# Patient Record
Sex: Female | Born: 1970
Health system: Southern US, Community
[De-identification: ages and names within clinical notes are randomized; demographics above are authoritative.]

## PROBLEM LIST (undated history)

## (undated) DIAGNOSIS — C50419 Malignant neoplasm of upper-outer quadrant of unspecified female breast: Secondary | ICD-10-CM

## (undated) DIAGNOSIS — Z923 Personal history of irradiation: Secondary | ICD-10-CM

## (undated) DIAGNOSIS — M1990 Unspecified osteoarthritis, unspecified site: Secondary | ICD-10-CM

## (undated) DIAGNOSIS — F410 Panic disorder [episodic paroxysmal anxiety] without agoraphobia: Secondary | ICD-10-CM

## (undated) DIAGNOSIS — I10 Essential (primary) hypertension: Secondary | ICD-10-CM

## (undated) DIAGNOSIS — C50919 Malignant neoplasm of unspecified site of unspecified female breast: Secondary | ICD-10-CM

## (undated) DIAGNOSIS — E119 Type 2 diabetes mellitus without complications: Secondary | ICD-10-CM

## (undated) DIAGNOSIS — Z9221 Personal history of antineoplastic chemotherapy: Secondary | ICD-10-CM

## (undated) DIAGNOSIS — G4730 Sleep apnea, unspecified: Secondary | ICD-10-CM

## (undated) HISTORY — DX: Essential (primary) hypertension: I10

## (undated) HISTORY — DX: Unspecified osteoarthritis, unspecified site: M19.90

## (undated) HISTORY — PX: BREAST BIOPSY: SHX20

## (undated) HISTORY — DX: Type 2 diabetes mellitus without complications: E11.9

## (undated) HISTORY — DX: Malignant neoplasm of upper-outer quadrant of unspecified female breast: C50.419

## (undated) HISTORY — PX: REDUCTION MAMMAPLASTY: SUR839

---

## 2003-05-06 HISTORY — PX: BREAST REDUCTION SURGERY: SHX8

## 2003-07-23 ENCOUNTER — Other Ambulatory Visit: Payer: Self-pay

## 2004-03-18 ENCOUNTER — Ambulatory Visit: Payer: Self-pay

## 2006-01-02 ENCOUNTER — Other Ambulatory Visit: Payer: Self-pay

## 2006-01-02 ENCOUNTER — Inpatient Hospital Stay: Payer: Self-pay | Admitting: Internal Medicine

## 2007-02-09 ENCOUNTER — Emergency Department (HOSPITAL_COMMUNITY): Admission: EM | Admit: 2007-02-09 | Discharge: 2007-02-09 | Payer: Self-pay | Admitting: Emergency Medicine

## 2007-03-23 ENCOUNTER — Emergency Department (HOSPITAL_COMMUNITY): Admission: EM | Admit: 2007-03-23 | Discharge: 2007-03-23 | Payer: Self-pay | Admitting: Emergency Medicine

## 2008-09-25 ENCOUNTER — Emergency Department (HOSPITAL_COMMUNITY): Admission: EM | Admit: 2008-09-25 | Discharge: 2008-09-25 | Payer: Self-pay | Admitting: Emergency Medicine

## 2008-11-19 ENCOUNTER — Emergency Department: Payer: Self-pay | Admitting: Emergency Medicine

## 2008-12-19 ENCOUNTER — Encounter: Payer: Self-pay | Admitting: Orthopedic Surgery

## 2009-01-03 ENCOUNTER — Encounter: Payer: Self-pay | Admitting: Orthopedic Surgery

## 2009-04-12 ENCOUNTER — Emergency Department: Payer: Self-pay | Admitting: Internal Medicine

## 2009-07-05 ENCOUNTER — Emergency Department: Payer: Self-pay | Admitting: Emergency Medicine

## 2009-07-12 ENCOUNTER — Ambulatory Visit: Payer: Self-pay | Admitting: Family Medicine

## 2009-09-04 ENCOUNTER — Emergency Department: Payer: Self-pay | Admitting: Emergency Medicine

## 2010-05-13 ENCOUNTER — Encounter: Payer: Self-pay | Admitting: Physician Assistant

## 2010-05-28 ENCOUNTER — Ambulatory Visit: Payer: Self-pay | Admitting: Internal Medicine

## 2010-05-31 ENCOUNTER — Ambulatory Visit: Payer: Self-pay | Admitting: Orthopedic Surgery

## 2010-06-05 ENCOUNTER — Encounter: Payer: Self-pay | Admitting: Physician Assistant

## 2010-07-03 ENCOUNTER — Emergency Department (HOSPITAL_COMMUNITY): Payer: Medicaid Other

## 2010-07-03 ENCOUNTER — Emergency Department (HOSPITAL_COMMUNITY)
Admission: EM | Admit: 2010-07-03 | Discharge: 2010-07-03 | Disposition: A | Discharge status: Home / Self Care | Payer: Medicaid Other | Attending: Emergency Medicine | Admitting: Emergency Medicine

## 2010-07-03 DIAGNOSIS — F988 Other specified behavioral and emotional disorders with onset usually occurring in childhood and adolescence: Secondary | ICD-10-CM | POA: Insufficient documentation

## 2010-07-03 DIAGNOSIS — Z79899 Other long term (current) drug therapy: Secondary | ICD-10-CM | POA: Insufficient documentation

## 2010-07-03 DIAGNOSIS — F329 Major depressive disorder, single episode, unspecified: Secondary | ICD-10-CM | POA: Insufficient documentation

## 2010-07-03 DIAGNOSIS — F3289 Other specified depressive episodes: Secondary | ICD-10-CM | POA: Insufficient documentation

## 2010-07-03 DIAGNOSIS — R0602 Shortness of breath: Secondary | ICD-10-CM | POA: Insufficient documentation

## 2010-07-03 DIAGNOSIS — R079 Chest pain, unspecified: Secondary | ICD-10-CM | POA: Insufficient documentation

## 2010-07-03 DIAGNOSIS — I10 Essential (primary) hypertension: Secondary | ICD-10-CM | POA: Insufficient documentation

## 2010-07-03 DIAGNOSIS — F411 Generalized anxiety disorder: Secondary | ICD-10-CM | POA: Insufficient documentation

## 2010-07-03 DIAGNOSIS — M129 Arthropathy, unspecified: Secondary | ICD-10-CM | POA: Insufficient documentation

## 2010-07-03 LAB — CBC
HCT: 39.4 % (ref 36.0–46.0)
Hemoglobin: 13.7 g/dL (ref 12.0–15.0)
MCH: 30.1 pg (ref 26.0–34.0)
RBC: 4.55 MIL/uL (ref 3.87–5.11)
WBC: 7.7 10*3/uL (ref 4.0–10.5)

## 2010-07-03 LAB — BASIC METABOLIC PANEL
Calcium: 9.4 mg/dL (ref 8.4–10.5)
Creatinine, Ser: 0.77 mg/dL (ref 0.4–1.2)
GFR calc non Af Amer: >60 mL/min (ref >60)
Potassium: 3.8 mEq/L (ref 3.5–5.1)

## 2010-07-03 LAB — DIFFERENTIAL
Basophils Absolute: 0 10*3/uL (ref 0.0–0.1)
Basophils Relative: 1 % (ref 0–1)
Eosinophils Absolute: 0.2 10*3/uL (ref 0.0–0.7)
Eosinophils Relative: 3 % (ref 0–5)
Lymphs Abs: 2.8 10*3/uL (ref 0.7–4.0)
Monocytes Relative: 9 % (ref 3–12)

## 2010-08-13 LAB — CBC
MCV: 87.5 fL (ref 78.0–100.0)
Platelets: 370 10*3/uL (ref 150–400)
RBC: 4.34 MIL/uL (ref 3.87–5.11)
RDW: 14.1 % (ref 11.5–15.5)
WBC: 9.1 10*3/uL (ref 4.0–10.5)

## 2010-08-13 LAB — POCT I-STAT, CHEM 8
BUN: 12 mg/dL (ref 6–23)
Chloride: 103 mEq/L (ref 96–112)
Glucose, Bld: 106 mg/dL — ABNORMAL HIGH (ref 70–99)
Hemoglobin: 13.3 g/dL (ref 12.0–15.0)
Potassium: 3.6 mEq/L (ref 3.5–5.1)
Sodium: 136 mEq/L (ref 135–145)
TCO2: 25 mmol/L (ref 0–100)

## 2010-08-13 LAB — URINALYSIS, ROUTINE W REFLEX MICROSCOPIC
Bilirubin Urine: NEGATIVE
Glucose, UA: NEGATIVE mg/dL
Hgb urine dipstick: NEGATIVE
Specific Gravity, Urine: 1.013 (ref 1.005–1.030)
Urobilinogen, UA: 0.2 mg/dL (ref 0.0–1.0)
pH: 6.5 (ref 5.0–8.0)

## 2010-08-13 LAB — POCT CARDIAC MARKERS
CKMB, poc: <1 ng/mL — ABNORMAL LOW (ref 1.0–8.0)
Myoglobin, poc: 50.8 ng/mL (ref 12–200)

## 2010-08-13 LAB — DIFFERENTIAL
Basophils Absolute: 0.1 10*3/uL (ref 0.0–0.1)
Eosinophils Absolute: 0.3 10*3/uL (ref 0.0–0.7)
Monocytes Relative: 8 % (ref 3–12)

## 2010-08-13 LAB — POCT PREGNANCY, URINE: Preg Test, Ur: NEGATIVE

## 2010-09-09 ENCOUNTER — Emergency Department: Payer: Self-pay | Admitting: Internal Medicine

## 2010-11-14 ENCOUNTER — Ambulatory Visit: Payer: Self-pay | Admitting: Family Medicine

## 2011-01-29 ENCOUNTER — Emergency Department: Payer: Self-pay | Admitting: Emergency Medicine

## 2011-02-11 LAB — DIFFERENTIAL
Basophils Absolute: 0.1
Lymphocytes Relative: 33
Monocytes Absolute: 0.6
Neutro Abs: 5.4
Neutrophils Relative %: 56

## 2011-02-11 LAB — I-STAT 8, (EC8 V) (CONVERTED LAB)
BUN: 11
Bicarbonate: 30.1 — ABNORMAL HIGH
Chloride: 102
Glucose, Bld: 96
HCT: 42
Operator id: 196461

## 2011-02-11 LAB — CBC
HCT: 38.2
RBC: 4.34
RDW: 14.1

## 2011-02-11 LAB — POCT CARDIAC MARKERS
CKMB, poc: <1 — ABNORMAL LOW
Operator id: 196461
Troponin i, poc: <0.05

## 2011-02-13 LAB — POCT CARDIAC MARKERS
CKMB, poc: <1 — ABNORMAL LOW
Myoglobin, poc: 48.5
Myoglobin, poc: 64.8
Operator id: 285841
Operator id: 285841
Troponin i, poc: <0.05
Troponin i, poc: <0.05

## 2011-02-13 LAB — URINALYSIS, ROUTINE W REFLEX MICROSCOPIC
Bilirubin Urine: NEGATIVE
Glucose, UA: NEGATIVE
Hgb urine dipstick: NEGATIVE
Protein, ur: NEGATIVE

## 2011-02-13 LAB — I-STAT 8, (EC8 V) (CONVERTED LAB)
BUN: 11
Operator id: 198171
Potassium: 3.8
pCO2, Ven: 52.5 — ABNORMAL HIGH
pH, Ven: 7.372 — ABNORMAL HIGH

## 2011-02-13 LAB — CBC
MCHC: 33.7
MCV: 88.8
RBC: 4.36

## 2011-02-13 LAB — DIFFERENTIAL
Basophils Relative: 1
Monocytes Relative: 6
Neutro Abs: 6.1

## 2011-02-13 LAB — POCT I-STAT CREATININE: Operator id: 198171

## 2011-02-19 ENCOUNTER — Ambulatory Visit: Payer: Self-pay | Admitting: Family Medicine

## 2011-02-20 ENCOUNTER — Ambulatory Visit: Payer: Self-pay | Admitting: Family Medicine

## 2011-03-24 ENCOUNTER — Ambulatory Visit: Payer: Self-pay | Admitting: General Surgery

## 2011-03-25 LAB — CEA: CEA: 1.6 ng/mL (ref 0.0–4.7)

## 2011-03-25 LAB — CANCER ANTIGEN 27.29: CA 27.29: 11.9 U/mL (ref 0.0–38.6)

## 2011-04-12 ENCOUNTER — Emergency Department: Payer: Self-pay | Admitting: Emergency Medicine

## 2011-05-06 HISTORY — PX: OOPHORECTOMY: SHX86

## 2011-05-06 HISTORY — PX: BREAST LUMPECTOMY: SHX2

## 2011-05-06 HISTORY — PX: BREAST BIOPSY: SHX20

## 2011-05-12 ENCOUNTER — Ambulatory Visit: Payer: Self-pay | Admitting: General Surgery

## 2011-05-21 ENCOUNTER — Ambulatory Visit: Payer: Self-pay | Admitting: General Surgery

## 2011-05-21 DIAGNOSIS — C50412 Malignant neoplasm of upper-outer quadrant of left female breast: Secondary | ICD-10-CM | POA: Insufficient documentation

## 2011-05-21 DIAGNOSIS — C50419 Malignant neoplasm of upper-outer quadrant of unspecified female breast: Secondary | ICD-10-CM | POA: Insufficient documentation

## 2011-05-21 HISTORY — DX: Malignant neoplasm of upper-outer quadrant of unspecified female breast: C50.419

## 2011-05-23 LAB — PATHOLOGY REPORT

## 2011-05-26 ENCOUNTER — Ambulatory Visit: Payer: Self-pay | Admitting: Oncology

## 2011-05-28 ENCOUNTER — Ambulatory Visit: Payer: Self-pay | Admitting: Oncology

## 2011-06-06 ENCOUNTER — Ambulatory Visit: Payer: Self-pay | Admitting: Oncology

## 2011-06-06 HISTORY — PX: BREAST MAMMOSITE: SHX5264

## 2011-06-23 LAB — COMPREHENSIVE METABOLIC PANEL
Albumin: 3.5 g/dL (ref 3.4–5.0)
BUN: 12 mg/dL (ref 7–18)
Calcium, Total: 8.6 mg/dL (ref 8.5–10.1)
Chloride: 101 mmol/L (ref 98–107)
EGFR (African American): >60
EGFR (Non-African Amer.): >60
Glucose: 95 mg/dL (ref 65–99)
SGOT(AST): 17 U/L (ref 15–37)
SGPT (ALT): 19 U/L
Total Protein: 7.5 g/dL (ref 6.4–8.2)

## 2011-06-23 LAB — CBC CANCER CENTER
Basophil %: 0.4 %
Eosinophil %: 3.3 %
HCT: 37.1 % (ref 35.0–47.0)
HGB: 12.2 g/dL (ref 12.0–16.0)
Lymphocyte #: 1.2 x10 3/mm (ref 1.0–3.6)
MCH: 29 pg (ref 26.0–34.0)
MCV: 88 fL (ref 80–100)
Monocyte #: 0.4 x10 3/mm (ref 0.0–0.7)
Monocyte %: 7.7 %
Neutrophil #: 3.8 x10 3/mm (ref 1.4–6.5)
RBC: 4.22 10*6/uL (ref 3.80–5.20)

## 2011-06-25 ENCOUNTER — Ambulatory Visit: Payer: Self-pay | Admitting: General Surgery

## 2011-07-04 ENCOUNTER — Ambulatory Visit: Payer: Self-pay | Admitting: Oncology

## 2011-07-08 LAB — CBC CANCER CENTER
Basophil #: 0.1 x10 3/mm (ref 0.0–0.1)
Lymphocyte #: 1.5 x10 3/mm (ref 1.0–3.6)
MCH: 29.3 pg (ref 26.0–34.0)
MCV: 88 fL (ref 80–100)
Monocyte #: 1.2 x10 3/mm — ABNORMAL HIGH (ref 0.0–0.7)
Monocyte %: 13.2 %
Neutrophil %: 67.9 %
Platelet: 318 x10 3/mm (ref 150–440)
RDW: 14.2 % (ref 11.5–14.5)
WBC: 8.8 x10 3/mm (ref 3.6–11.0)

## 2011-07-15 LAB — CBC CANCER CENTER
Basophil #: 0 x10 3/mm (ref 0.0–0.1)
HCT: 35.2 % (ref 35.0–47.0)
Lymphocyte %: 12.3 %
Monocyte %: 6.3 %
Neutrophil #: 5.2 x10 3/mm (ref 1.4–6.5)
Platelet: 199 x10 3/mm (ref 150–440)
RBC: 4.03 10*6/uL (ref 3.80–5.20)
RDW: 14.4 % (ref 11.5–14.5)
WBC: 6.5 x10 3/mm (ref 3.6–11.0)

## 2011-07-22 LAB — CBC CANCER CENTER
Basophil #: 0 x10 3/mm (ref 0.0–0.1)
HCT: 35.7 % (ref 35.0–47.0)
HGB: 12 g/dL (ref 12.0–16.0)
Lymphocyte %: 7.5 %
MCH: 29.3 pg (ref 26.0–34.0)
MCV: 87 fL (ref 80–100)
Monocyte %: 5.6 %
Platelet: 346 x10 3/mm (ref 150–440)
RDW: 14.7 % — ABNORMAL HIGH (ref 11.5–14.5)

## 2011-07-22 LAB — COMPREHENSIVE METABOLIC PANEL
Anion Gap: 10 (ref 7–16)
Bilirubin,Total: 0.2 mg/dL (ref 0.2–1.0)
Chloride: 101 mmol/L (ref 98–107)
EGFR (African American): >60
EGFR (Non-African Amer.): >60
Osmolality: 282 (ref 275–301)
Potassium: 3.7 mmol/L (ref 3.5–5.1)
SGPT (ALT): 27 U/L
Sodium: 139 mmol/L (ref 136–145)
Total Protein: 7.6 g/dL (ref 6.4–8.2)

## 2011-07-29 LAB — CBC CANCER CENTER
Basophil: 2 %
Eosinophil %: 0.8 %
HGB: 11.6 g/dL — ABNORMAL LOW (ref 12.0–16.0)
Lymphocyte #: 1 x10 3/mm (ref 1.0–3.6)
Lymphocyte %: 20.5 %
Lymphocytes: 21 %
Metamyelocyte: 1 %
Monocyte #: 1.1 x10 3/mm — ABNORMAL HIGH (ref 0.0–0.7)
Monocyte %: 22.3 %
NRBC/100 WBC: 1 /100
Neutrophil %: 55.4 %
RDW: 15.1 % — ABNORMAL HIGH (ref 11.5–14.5)
Variant Lymphocyte: 2 %

## 2011-08-04 ENCOUNTER — Ambulatory Visit: Payer: Self-pay | Admitting: Oncology

## 2011-08-05 LAB — CBC CANCER CENTER
Eosinophil %: 0.8 %
Lymphocyte #: 1.2 x10 3/mm (ref 1.0–3.6)
MCHC: 33.1 g/dL (ref 32.0–36.0)
Neutrophil %: 80.6 %
RBC: 3.95 10*6/uL (ref 3.80–5.20)
RDW: 15.9 % — ABNORMAL HIGH (ref 11.5–14.5)

## 2011-08-12 LAB — CBC CANCER CENTER
Basophil %: 0 %
Eosinophil #: 0 x10 3/mm (ref 0.0–0.7)
HCT: 31.8 % — ABNORMAL LOW (ref 35.0–47.0)
Lymphocyte #: 0.8 x10 3/mm — ABNORMAL LOW (ref 1.0–3.6)
Lymphocyte %: 6.5 %
MCHC: 33.5 g/dL (ref 32.0–36.0)
MCV: 88 fL (ref 80–100)
Monocyte %: 4.5 %
Neutrophil %: 88.9 %
Platelet: 351 x10 3/mm (ref 150–440)
RBC: 3.61 10*6/uL — ABNORMAL LOW (ref 3.80–5.20)

## 2011-08-12 LAB — COMPREHENSIVE METABOLIC PANEL
Anion Gap: 11 (ref 7–16)
BUN: 12 mg/dL (ref 7–18)
Bilirubin,Total: 0.2 mg/dL (ref 0.2–1.0)
Creatinine: 0.69 mg/dL (ref 0.60–1.30)
EGFR (African American): >60
EGFR (Non-African Amer.): >60
Osmolality: 284 (ref 275–301)
SGOT(AST): 16 U/L (ref 15–37)
SGPT (ALT): 26 U/L
Sodium: 141 mmol/L (ref 136–145)
Total Protein: 7.2 g/dL (ref 6.4–8.2)

## 2011-08-19 LAB — CBC CANCER CENTER
Basophil %: 1.3 %
Lymphocyte %: 22.1 %
MCH: 28.5 pg (ref 26.0–34.0)
MCV: 89 fL (ref 80–100)
Monocyte %: 24.2 %
Neutrophil #: 3.3 x10 3/mm (ref 1.4–6.5)
Neutrophil %: 51.7 %
RBC: 3.82 10*6/uL (ref 3.80–5.20)

## 2011-08-26 LAB — CBC CANCER CENTER
Basophil #: 0 x10 3/mm (ref 0.0–0.1)
Eosinophil %: 0.7 %
HCT: 33.9 % — ABNORMAL LOW (ref 35.0–47.0)
Lymphocyte #: 0.8 x10 3/mm — ABNORMAL LOW (ref 1.0–3.6)
Lymphocyte %: 10.9 %
MCH: 28.4 pg (ref 26.0–34.0)
Monocyte %: 6.7 %
Neutrophil #: 6.3 x10 3/mm (ref 1.4–6.5)
Neutrophil %: 81.3 %
RBC: 3.78 10*6/uL — ABNORMAL LOW (ref 3.80–5.20)
RDW: 16.9 % — ABNORMAL HIGH (ref 11.5–14.5)

## 2011-09-02 LAB — CBC CANCER CENTER
Basophil %: 0.7 %
Eosinophil #: 0 x10 3/mm (ref 0.0–0.7)
HGB: 10.6 g/dL — ABNORMAL LOW (ref 12.0–16.0)
MCH: 28.8 pg (ref 26.0–34.0)
MCHC: 32.3 g/dL (ref 32.0–36.0)
Monocyte #: 0.7 x10 3/mm (ref 0.2–0.9)
Neutrophil #: 12.4 x10 3/mm — ABNORMAL HIGH (ref 1.4–6.5)
Neutrophil %: 88.1 %
RBC: 3.67 10*6/uL — ABNORMAL LOW (ref 3.80–5.20)
RDW: 17.3 % — ABNORMAL HIGH (ref 11.5–14.5)

## 2011-09-02 LAB — COMPREHENSIVE METABOLIC PANEL
Alkaline Phosphatase: 74 U/L (ref 50–136)
BUN: 14 mg/dL (ref 7–18)
Bilirubin,Total: 0.2 mg/dL (ref 0.2–1.0)
Calcium, Total: 9.1 mg/dL (ref 8.5–10.1)
Co2: 26 mmol/L (ref 21–32)
EGFR (African American): >60
EGFR (Non-African Amer.): >60
Glucose: 128 mg/dL — ABNORMAL HIGH (ref 65–99)
Osmolality: 282 (ref 275–301)
Potassium: 3.7 mmol/L (ref 3.5–5.1)
SGOT(AST): 15 U/L (ref 15–37)
Sodium: 140 mmol/L (ref 136–145)

## 2011-09-03 ENCOUNTER — Ambulatory Visit: Payer: Self-pay | Admitting: Oncology

## 2011-09-09 LAB — CBC CANCER CENTER
Basophil #: 0.1 x10 3/mm (ref 0.0–0.1)
Basophil %: 1.1 %
HCT: 32.8 % — ABNORMAL LOW (ref 35.0–47.0)
HGB: 10.5 g/dL — ABNORMAL LOW (ref 12.0–16.0)
MCH: 29 pg (ref 26.0–34.0)
MCHC: 32.1 g/dL (ref 32.0–36.0)
MCV: 90 fL (ref 80–100)
Neutrophil %: 55.2 %
Platelet: 294 x10 3/mm (ref 150–440)
RBC: 3.64 10*6/uL — ABNORMAL LOW (ref 3.80–5.20)
WBC: 7.2 x10 3/mm (ref 3.6–11.0)

## 2011-09-16 LAB — CBC CANCER CENTER
Eosinophil #: 0.1 x10 3/mm (ref 0.0–0.7)
Eosinophil %: 0.9 %
HCT: 32.7 % — ABNORMAL LOW (ref 35.0–47.0)
HGB: 10.6 g/dL — ABNORMAL LOW (ref 12.0–16.0)
MCH: 28.9 pg (ref 26.0–34.0)
MCV: 89 fL (ref 80–100)
Monocyte #: 0.4 x10 3/mm (ref 0.2–0.9)
Neutrophil #: 6.5 x10 3/mm (ref 1.4–6.5)
Neutrophil %: 83.5 %
Platelet: 196 x10 3/mm (ref 150–440)
RDW: 18.3 % — ABNORMAL HIGH (ref 11.5–14.5)

## 2011-09-23 LAB — COMPREHENSIVE METABOLIC PANEL
Albumin: 3.4 g/dL (ref 3.4–5.0)
Anion Gap: 11 (ref 7–16)
BUN: 15 mg/dL (ref 7–18)
Bilirubin,Total: 0.2 mg/dL (ref 0.2–1.0)
Chloride: 102 mmol/L (ref 98–107)
EGFR (African American): >60
Glucose: 132 mg/dL — ABNORMAL HIGH (ref 65–99)
Osmolality: 284 (ref 275–301)
Potassium: 3.7 mmol/L (ref 3.5–5.1)
SGOT(AST): 13 U/L — ABNORMAL LOW (ref 15–37)
SGPT (ALT): 26 U/L
Sodium: 141 mmol/L (ref 136–145)
Total Protein: 6.9 g/dL (ref 6.4–8.2)

## 2011-09-23 LAB — CBC CANCER CENTER
Basophil #: 0 x10 3/mm (ref 0.0–0.1)
Basophil %: 0.1 %
Eosinophil %: 0 %
HCT: 32.4 % — ABNORMAL LOW (ref 35.0–47.0)
HGB: 10.4 g/dL — ABNORMAL LOW (ref 12.0–16.0)
Lymphocyte #: 0.9 x10 3/mm — ABNORMAL LOW (ref 1.0–3.6)
Lymphocyte %: 7.6 %
MCH: 28.5 pg (ref 26.0–34.0)
MCHC: 32.1 g/dL (ref 32.0–36.0)
MCV: 89 fL (ref 80–100)
Monocyte %: 6 %
Neutrophil #: 9.8 x10 3/mm — ABNORMAL HIGH (ref 1.4–6.5)
Neutrophil %: 86.3 %
Platelet: 322 x10 3/mm (ref 150–440)
RBC: 3.65 10*6/uL — ABNORMAL LOW (ref 3.80–5.20)
RDW: 18.6 % — ABNORMAL HIGH (ref 11.5–14.5)
WBC: 11.4 x10 3/mm — ABNORMAL HIGH (ref 3.6–11.0)

## 2011-09-30 LAB — CBC CANCER CENTER
Basophil %: 0.8 %
HGB: 10.1 g/dL — ABNORMAL LOW (ref 12.0–16.0)
Lymphocyte #: 1 x10 3/mm (ref 1.0–3.6)
Lymphocyte %: 17.1 %
MCH: 29.4 pg (ref 26.0–34.0)
MCHC: 32.4 g/dL (ref 32.0–36.0)
Monocyte #: 1.5 x10 3/mm — ABNORMAL HIGH (ref 0.2–0.9)
Monocyte %: 25.4 %
Neutrophil #: 3.3 x10 3/mm (ref 1.4–6.5)
Platelet: 275 x10 3/mm (ref 150–440)
RBC: 3.45 10*6/uL — ABNORMAL LOW (ref 3.80–5.20)
RDW: 18.7 % — ABNORMAL HIGH (ref 11.5–14.5)
WBC: 6 x10 3/mm (ref 3.6–11.0)

## 2011-10-04 ENCOUNTER — Ambulatory Visit: Payer: Self-pay | Admitting: Oncology

## 2011-10-07 LAB — CBC CANCER CENTER
Basophil %: 0.4 %
Eosinophil #: 0.1 x10 3/mm (ref 0.0–0.7)
Eosinophil %: 1.4 %
HCT: 30.2 % — ABNORMAL LOW (ref 35.0–47.0)
HGB: 9.7 g/dL — ABNORMAL LOW (ref 12.0–16.0)
Lymphocyte %: 9.9 %
MCH: 29.1 pg (ref 26.0–34.0)
MCHC: 32 g/dL (ref 32.0–36.0)
MCV: 91 fL (ref 80–100)
Monocyte #: 0.6 x10 3/mm (ref 0.2–0.9)
Neutrophil #: 7.2 x10 3/mm — ABNORMAL HIGH (ref 1.4–6.5)
Neutrophil %: 81.9 %
RBC: 3.33 10*6/uL — ABNORMAL LOW (ref 3.80–5.20)
RDW: 18.8 % — ABNORMAL HIGH (ref 11.5–14.5)
WBC: 8.8 x10 3/mm (ref 3.6–11.0)

## 2011-10-14 LAB — CBC CANCER CENTER
HCT: 31.5 % — ABNORMAL LOW (ref 35.0–47.0)
Lymphocyte #: 0.7 x10 3/mm — ABNORMAL LOW (ref 1.0–3.6)
Lymphocyte %: 6.3 %
MCH: 29.1 pg (ref 26.0–34.0)
Monocyte %: 6.3 %
Neutrophil %: 87.3 %
Platelet: 356 x10 3/mm (ref 150–440)
RBC: 3.49 10*6/uL — ABNORMAL LOW (ref 3.80–5.20)
RDW: 18.8 % — ABNORMAL HIGH (ref 11.5–14.5)

## 2011-10-14 LAB — COMPREHENSIVE METABOLIC PANEL
Alkaline Phosphatase: 93 U/L (ref 50–136)
Anion Gap: 9 (ref 7–16)
BUN: 11 mg/dL (ref 7–18)
Bilirubin,Total: 0.2 mg/dL (ref 0.2–1.0)
Calcium, Total: 8.8 mg/dL (ref 8.5–10.1)
Chloride: 102 mmol/L (ref 98–107)
Co2: 29 mmol/L (ref 21–32)
Creatinine: 0.65 mg/dL (ref 0.60–1.30)
EGFR (African American): >60
Glucose: 145 mg/dL — ABNORMAL HIGH (ref 65–99)
Potassium: 3.7 mmol/L (ref 3.5–5.1)
SGPT (ALT): 24 U/L
Sodium: 140 mmol/L (ref 136–145)
Total Protein: 7.1 g/dL (ref 6.4–8.2)

## 2011-10-21 LAB — CBC CANCER CENTER
Basophil #: 0 x10 3/mm (ref 0.0–0.1)
Basophil %: 0.8 %
Eosinophil #: 0.1 x10 3/mm (ref 0.0–0.7)
Eosinophil %: 1.4 %
Lymphocyte #: 1.1 x10 3/mm (ref 1.0–3.6)
Lymphocyte %: 21 %
MCH: 29.3 pg (ref 26.0–34.0)
Monocyte #: 1.1 x10 3/mm — ABNORMAL HIGH (ref 0.2–0.9)
Monocyte %: 21.1 %
Neutrophil #: 2.9 x10 3/mm (ref 1.4–6.5)
Neutrophil %: 55.7 %
Platelet: 268 x10 3/mm (ref 150–440)
RDW: 18.4 % — ABNORMAL HIGH (ref 11.5–14.5)

## 2011-10-28 LAB — CBC CANCER CENTER
Basophil #: 0.1 x10 3/mm (ref 0.0–0.1)
Basophil %: 0.8 %
Eosinophil %: 1.5 %
HGB: 11 g/dL — ABNORMAL LOW (ref 12.0–16.0)
MCH: 29.2 pg (ref 26.0–34.0)
MCHC: 32.1 g/dL (ref 32.0–36.0)
Monocyte %: 5.7 %
Neutrophil #: 6.2 x10 3/mm (ref 1.4–6.5)
Neutrophil %: 78.8 %
Platelet: 262 x10 3/mm (ref 150–440)
RBC: 3.76 10*6/uL — ABNORMAL LOW (ref 3.80–5.20)
RDW: 18.6 % — ABNORMAL HIGH (ref 11.5–14.5)

## 2011-11-03 ENCOUNTER — Ambulatory Visit: Payer: Self-pay | Admitting: Oncology

## 2011-11-04 LAB — COMPREHENSIVE METABOLIC PANEL
Albumin: 3.5 g/dL (ref 3.4–5.0)
Alkaline Phosphatase: 90 U/L (ref 50–136)
Calcium, Total: 9.3 mg/dL (ref 8.5–10.1)
Co2: 32 mmol/L (ref 21–32)
Creatinine: 0.58 mg/dL — ABNORMAL LOW (ref 0.60–1.30)
EGFR (Non-African Amer.): >60
SGPT (ALT): 25 U/L

## 2011-11-04 LAB — CBC CANCER CENTER
Eosinophil #: 0.1 x10 3/mm (ref 0.0–0.7)
Lymphocyte %: 17.2 %
MCHC: 32.3 g/dL (ref 32.0–36.0)
Monocyte #: 0.4 x10 3/mm (ref 0.2–0.9)
Neutrophil %: 70.4 %
Platelet: 321 x10 3/mm (ref 150–440)
RBC: 3.59 10*6/uL — ABNORMAL LOW (ref 3.80–5.20)
RDW: 17.8 % — ABNORMAL HIGH (ref 11.5–14.5)
WBC: 4.8 x10 3/mm (ref 3.6–11.0)

## 2011-12-04 ENCOUNTER — Ambulatory Visit: Payer: Self-pay | Admitting: Oncology

## 2012-01-04 ENCOUNTER — Ambulatory Visit: Payer: Self-pay | Admitting: Oncology

## 2012-02-03 ENCOUNTER — Ambulatory Visit: Payer: Self-pay | Admitting: Oncology

## 2012-02-25 LAB — CBC CANCER CENTER
Basophil %: 0.6 %
Eosinophil %: 4.5 %
HGB: 12 g/dL (ref 12.0–16.0)
Lymphocyte %: 23.9 %
MCH: 27.8 pg (ref 26.0–34.0)
Monocyte #: 0.4 x10 3/mm (ref 0.2–0.9)
Neutrophil %: 64.7 %
Platelet: 251 x10 3/mm (ref 150–440)
RBC: 4.3 10*6/uL (ref 3.80–5.20)
WBC: 6.1 x10 3/mm (ref 3.6–11.0)

## 2012-02-25 LAB — COMPREHENSIVE METABOLIC PANEL
BUN: 13 mg/dL (ref 7–18)
Chloride: 103 mmol/L (ref 98–107)
Co2: 30 mmol/L (ref 21–32)
EGFR (African American): >60
EGFR (Non-African Amer.): >60
SGOT(AST): 14 U/L — ABNORMAL LOW (ref 15–37)
SGPT (ALT): 22 U/L (ref 12–78)

## 2012-03-05 ENCOUNTER — Ambulatory Visit: Payer: Self-pay | Admitting: Oncology

## 2012-04-04 ENCOUNTER — Ambulatory Visit: Payer: Self-pay | Admitting: Oncology

## 2012-05-12 ENCOUNTER — Ambulatory Visit: Payer: Self-pay | Admitting: Oncology

## 2012-06-05 ENCOUNTER — Ambulatory Visit: Payer: Self-pay | Admitting: Oncology

## 2012-08-02 ENCOUNTER — Ambulatory Visit: Payer: Self-pay | Admitting: Oncology

## 2012-08-03 ENCOUNTER — Ambulatory Visit: Payer: Self-pay | Admitting: Oncology

## 2012-08-23 ENCOUNTER — Ambulatory Visit: Payer: Self-pay | Admitting: Oncology

## 2012-08-25 LAB — CBC CANCER CENTER
Basophil %: 0.8 %
Eosinophil #: 0.8 x10 3/mm — ABNORMAL HIGH (ref 0.0–0.7)
Eosinophil %: 11.6 %
HCT: 37.7 % (ref 35.0–47.0)
HGB: 12.5 g/dL (ref 12.0–16.0)
MCHC: 33.2 g/dL (ref 32.0–36.0)
MCV: 89 fL (ref 80–100)
Neutrophil %: 52.5 %
Platelet: 238 x10 3/mm (ref 150–440)
RDW: 14.3 % (ref 11.5–14.5)

## 2012-08-25 LAB — COMPREHENSIVE METABOLIC PANEL
Albumin: 3.2 g/dL — ABNORMAL LOW (ref 3.4–5.0)
Alkaline Phosphatase: 67 U/L (ref 50–136)
Bilirubin,Total: 0.2 mg/dL (ref 0.2–1.0)
Calcium, Total: 8.9 mg/dL (ref 8.5–10.1)
Creatinine: 0.84 mg/dL (ref 0.60–1.30)
Glucose: 122 mg/dL — ABNORMAL HIGH (ref 65–99)
Potassium: 3.9 mmol/L (ref 3.5–5.1)
SGOT(AST): 16 U/L (ref 15–37)
Total Protein: 7 g/dL (ref 6.4–8.2)

## 2012-08-26 LAB — CANCER ANTIGEN 27.29: CA 27.29: 11.1 U/mL (ref 0.0–38.6)

## 2012-09-02 ENCOUNTER — Ambulatory Visit: Payer: Self-pay | Admitting: Oncology

## 2012-10-03 ENCOUNTER — Ambulatory Visit: Payer: Self-pay | Admitting: Oncology

## 2012-10-03 ENCOUNTER — Ambulatory Visit: Payer: Self-pay | Admitting: Family Medicine

## 2012-11-02 ENCOUNTER — Ambulatory Visit: Payer: Self-pay | Admitting: Oncology

## 2012-11-16 ENCOUNTER — Emergency Department: Payer: Self-pay | Admitting: Emergency Medicine

## 2012-11-16 LAB — URINALYSIS, COMPLETE
Blood: NEGATIVE
RBC,UR: NONE SEEN /HPF (ref 0–5)
Specific Gravity: 1.005 (ref 1.003–1.030)
WBC UR: 1 /HPF (ref 0–5)

## 2012-11-17 ENCOUNTER — Ambulatory Visit: Payer: Self-pay | Admitting: Oncology

## 2012-12-03 ENCOUNTER — Ambulatory Visit: Payer: Self-pay | Admitting: Oncology

## 2012-12-18 ENCOUNTER — Emergency Department: Payer: Self-pay | Admitting: Internal Medicine

## 2012-12-18 LAB — CBC
HGB: 13 g/dL (ref 12.0–16.0)
MCV: 89 fL (ref 80–100)
Platelet: 261 10*3/uL (ref 150–440)
RBC: 4.21 10*6/uL (ref 3.80–5.20)
WBC: 9.2 10*3/uL (ref 3.6–11.0)

## 2012-12-18 LAB — URINALYSIS, COMPLETE
Bilirubin,UR: NEGATIVE
Blood: NEGATIVE
Ketone: NEGATIVE
Leukocyte Esterase: NEGATIVE
Nitrite: NEGATIVE
Ph: 5 (ref 4.5–8.0)
RBC,UR: <1 /HPF (ref 0–5)
Specific Gravity: 1.021 (ref 1.003–1.030)
Squamous Epithelial: <1
WBC UR: 1 /HPF (ref 0–5)

## 2012-12-18 LAB — COMPREHENSIVE METABOLIC PANEL
BUN: 12 mg/dL (ref 7–18)
Creatinine: 0.77 mg/dL (ref 0.60–1.30)
EGFR (Non-African Amer.): >60
SGOT(AST): 34 U/L (ref 15–37)
SGPT (ALT): 27 U/L (ref 12–78)
Sodium: 140 mmol/L (ref 136–145)
Total Protein: 7.1 g/dL (ref 6.4–8.2)

## 2012-12-18 LAB — TROPONIN I: Troponin-I: <0.02 ng/mL

## 2012-12-18 LAB — CK TOTAL AND CKMB (NOT AT ARMC): CK, Total: 61 U/L (ref 21–215)

## 2012-12-29 ENCOUNTER — Ambulatory Visit: Payer: Self-pay | Admitting: Oncology

## 2013-01-03 ENCOUNTER — Ambulatory Visit: Payer: Self-pay | Admitting: Oncology

## 2013-02-02 ENCOUNTER — Emergency Department: Payer: Self-pay | Admitting: Emergency Medicine

## 2013-02-02 LAB — CBC
HCT: 39.2 % (ref 35.0–47.0)
HGB: 13.2 g/dL (ref 12.0–16.0)
MCH: 30.4 pg (ref 26.0–34.0)
MCV: 90 fL (ref 80–100)
Platelet: 262 10*3/uL (ref 150–440)
RBC: 4.35 10*6/uL (ref 3.80–5.20)
RDW: 14.7 % — ABNORMAL HIGH (ref 11.5–14.5)
WBC: 8.1 10*3/uL (ref 3.6–11.0)

## 2013-02-02 LAB — URINALYSIS, COMPLETE
Bacteria: NONE SEEN
Blood: NEGATIVE
Glucose,UR: NEGATIVE mg/dL (ref 0–75)
Ketone: NEGATIVE
Nitrite: NEGATIVE
Ph: 5 (ref 4.5–8.0)
Protein: NEGATIVE
Specific Gravity: 1.019 (ref 1.003–1.030)
WBC UR: 1 /HPF (ref 0–5)

## 2013-02-02 LAB — BASIC METABOLIC PANEL
Anion Gap: 7 (ref 7–16)
BUN: 11 mg/dL (ref 7–18)
Calcium, Total: 9.1 mg/dL (ref 8.5–10.1)
Co2: 30 mmol/L (ref 21–32)
EGFR (African American): >60
EGFR (Non-African Amer.): >60
Glucose: 148 mg/dL — ABNORMAL HIGH (ref 65–99)
Osmolality: 280 (ref 275–301)
Potassium: 3.1 mmol/L — ABNORMAL LOW (ref 3.5–5.1)

## 2013-02-02 LAB — TROPONIN I: Troponin-I: <0.02 ng/mL

## 2013-02-09 ENCOUNTER — Ambulatory Visit: Payer: Self-pay | Admitting: Oncology

## 2013-02-09 LAB — CBC CANCER CENTER
Basophil #: 0 x10 3/mm (ref 0.0–0.1)
Basophil %: 0.3 %
Eosinophil #: 0.6 x10 3/mm (ref 0.0–0.7)
Eosinophil %: 6.9 %
HCT: 37.5 % (ref 35.0–47.0)
HGB: 12.5 g/dL (ref 12.0–16.0)
Lymphocyte #: 2.6 x10 3/mm (ref 1.0–3.6)
Lymphocyte %: 31.1 %
MCH: 30.5 pg (ref 26.0–34.0)
MCV: 91 fL (ref 80–100)
Monocyte #: 0.5 x10 3/mm (ref 0.2–0.9)
Monocyte %: 5.5 %
Neutrophil #: 4.6 x10 3/mm (ref 1.4–6.5)
Neutrophil %: 56.2 %
RBC: 4.11 10*6/uL (ref 3.80–5.20)
WBC: 8.2 x10 3/mm (ref 3.6–11.0)

## 2013-02-09 LAB — COMPREHENSIVE METABOLIC PANEL
Albumin: 3.1 g/dL — ABNORMAL LOW (ref 3.4–5.0)
Alkaline Phosphatase: 74 U/L (ref 50–136)
Anion Gap: 9 (ref 7–16)
Bilirubin,Total: 0.2 mg/dL (ref 0.2–1.0)
Calcium, Total: 8.3 mg/dL — ABNORMAL LOW (ref 8.5–10.1)
Chloride: 103 mmol/L (ref 98–107)
Co2: 29 mmol/L (ref 21–32)
Creatinine: 0.86 mg/dL (ref 0.60–1.30)
EGFR (Non-African Amer.): >60
Glucose: 139 mg/dL — ABNORMAL HIGH (ref 65–99)
Osmolality: 283 (ref 275–301)
Potassium: 3.5 mmol/L (ref 3.5–5.1)
SGOT(AST): 22 U/L (ref 15–37)
Sodium: 141 mmol/L (ref 136–145)

## 2013-02-10 LAB — CANCER ANTIGEN 27.29: CA 27.29: 8.9 U/mL (ref 0.0–38.6)

## 2013-02-22 ENCOUNTER — Emergency Department: Payer: Self-pay | Admitting: Emergency Medicine

## 2013-02-22 LAB — CBC
HGB: 13.3 g/dL (ref 12.0–16.0)
MCH: 30.5 pg (ref 26.0–34.0)
MCHC: 34 g/dL (ref 32.0–36.0)
Platelet: 288 10*3/uL (ref 150–440)
RDW: 14.1 % (ref 11.5–14.5)

## 2013-02-22 LAB — TROPONIN I: Troponin-I: <0.02 ng/mL

## 2013-02-22 LAB — URINALYSIS, COMPLETE
Bacteria: NONE SEEN
Ketone: NEGATIVE
Leukocyte Esterase: NEGATIVE
Nitrite: NEGATIVE
Ph: 7 (ref 4.5–8.0)
Protein: NEGATIVE
RBC,UR: 1 /HPF (ref 0–5)
Specific Gravity: 1.012 (ref 1.003–1.030)
Squamous Epithelial: 3

## 2013-02-22 LAB — COMPREHENSIVE METABOLIC PANEL
Alkaline Phosphatase: 65 U/L (ref 50–136)
BUN: 8 mg/dL (ref 7–18)
Calcium, Total: 9.3 mg/dL (ref 8.5–10.1)
Co2: 29 mmol/L (ref 21–32)
EGFR (Non-African Amer.): >60
Glucose: 125 mg/dL — ABNORMAL HIGH (ref 65–99)
Potassium: 3.2 mmol/L — ABNORMAL LOW (ref 3.5–5.1)
SGOT(AST): 43 U/L — ABNORMAL HIGH (ref 15–37)
SGPT (ALT): 36 U/L (ref 12–78)
Sodium: 137 mmol/L (ref 136–145)
Total Protein: 7.5 g/dL (ref 6.4–8.2)

## 2013-03-05 ENCOUNTER — Ambulatory Visit: Payer: Self-pay | Admitting: Oncology

## 2013-04-15 ENCOUNTER — Ambulatory Visit: Payer: Self-pay | Admitting: Oncology

## 2013-05-05 ENCOUNTER — Ambulatory Visit: Payer: Self-pay | Admitting: Oncology

## 2013-06-29 ENCOUNTER — Ambulatory Visit: Payer: Self-pay | Admitting: Oncology

## 2013-07-04 ENCOUNTER — Ambulatory Visit: Payer: Self-pay | Admitting: Bariatrics

## 2013-07-04 DIAGNOSIS — E119 Type 2 diabetes mellitus without complications: Secondary | ICD-10-CM

## 2013-07-04 DIAGNOSIS — E669 Obesity, unspecified: Secondary | ICD-10-CM

## 2013-07-04 LAB — PROTIME-INR
INR: 0.9
PROTHROMBIN TIME: 12.1 s (ref 11.5–14.7)

## 2013-07-04 LAB — CBC WITH DIFFERENTIAL/PLATELET
Basophil #: 0.1 10*3/uL (ref 0.0–0.1)
Basophil %: 1.7 %
EOS ABS: 0.5 10*3/uL (ref 0.0–0.7)
Eosinophil %: 7.1 %
HCT: 37.2 % (ref 35.0–47.0)
HGB: 12.6 g/dL (ref 12.0–16.0)
LYMPHS PCT: 30.3 %
Lymphocyte #: 2.3 10*3/uL (ref 1.0–3.6)
MCH: 30.7 pg (ref 26.0–34.0)
MCHC: 33.7 g/dL (ref 32.0–36.0)
MCV: 91 fL (ref 80–100)
MONOS PCT: 6.9 %
Monocyte #: 0.5 x10 3/mm (ref 0.2–0.9)
NEUTROS ABS: 4.2 10*3/uL (ref 1.4–6.5)
NEUTROS PCT: 54 %
PLATELETS: 251 10*3/uL (ref 150–440)
RBC: 4.09 10*6/uL (ref 3.80–5.20)
RDW: 14 % (ref 11.5–14.5)
WBC: 7.7 10*3/uL (ref 3.6–11.0)

## 2013-07-04 LAB — FERRITIN: Ferritin (ARMC): 76 ng/mL (ref 8–388)

## 2013-07-04 LAB — AMYLASE: Amylase: 44 U/L (ref 25–115)

## 2013-07-04 LAB — HEMOGLOBIN A1C: HEMOGLOBIN A1C: 7.4 % — AB (ref 4.2–6.3)

## 2013-07-04 LAB — PHOSPHORUS: PHOSPHORUS: 3.9 mg/dL (ref 2.5–4.9)

## 2013-07-04 LAB — IRON AND TIBC
IRON SATURATION: 16 %
Iron Bind.Cap.(Total): 305 ug/dL (ref 250–450)
Iron: 48 ug/dL — ABNORMAL LOW (ref 50–170)
UNBOUND IRON-BIND. CAP.: 257 ug/dL

## 2013-07-04 LAB — COMPREHENSIVE METABOLIC PANEL
ALT: 32 U/L (ref 12–78)
ANION GAP: 3 — AB (ref 7–16)
AST: 24 U/L (ref 15–37)
Albumin: 3.1 g/dL — ABNORMAL LOW (ref 3.4–5.0)
Alkaline Phosphatase: 60 U/L
BILIRUBIN TOTAL: 0.1 mg/dL — AB (ref 0.2–1.0)
BUN: 13 mg/dL (ref 7–18)
Calcium, Total: 8.8 mg/dL (ref 8.5–10.1)
Chloride: 99 mmol/L (ref 98–107)
Co2: 35 mmol/L — ABNORMAL HIGH (ref 21–32)
Creatinine: 0.68 mg/dL (ref 0.60–1.30)
EGFR (African American): >60
GLUCOSE: 174 mg/dL — AB (ref 65–99)
Osmolality: 278 (ref 275–301)
POTASSIUM: 3.1 mmol/L — AB (ref 3.5–5.1)
Sodium: 137 mmol/L (ref 136–145)
Total Protein: 6.8 g/dL (ref 6.4–8.2)

## 2013-07-04 LAB — BILIRUBIN, DIRECT

## 2013-07-04 LAB — LIPASE, BLOOD: Lipase: 86 U/L (ref 73–393)

## 2013-07-04 LAB — APTT: Activated PTT: 26.7 secs (ref 23.6–35.9)

## 2013-07-04 LAB — FOLATE: Folic Acid: 6.4 ng/mL (ref 3.1–100.0)

## 2013-07-04 LAB — MAGNESIUM: Magnesium: 1.8 mg/dL

## 2013-07-04 LAB — TSH: THYROID STIMULATING HORM: 1.94 u[IU]/mL

## 2013-07-07 ENCOUNTER — Ambulatory Visit: Payer: Self-pay | Admitting: Bariatrics

## 2013-07-07 LAB — HCG, QUANTITATIVE, PREGNANCY

## 2013-07-08 ENCOUNTER — Ambulatory Visit: Payer: Self-pay | Admitting: Oncology

## 2013-07-15 ENCOUNTER — Ambulatory Visit: Payer: Self-pay | Admitting: Bariatrics

## 2013-07-17 ENCOUNTER — Emergency Department: Payer: Self-pay | Admitting: Emergency Medicine

## 2013-07-17 LAB — COMPREHENSIVE METABOLIC PANEL
Albumin: 3.2 g/dL — ABNORMAL LOW (ref 3.4–5.0)
Alkaline Phosphatase: 68 U/L
Anion Gap: 5 — ABNORMAL LOW (ref 7–16)
BUN: 12 mg/dL (ref 7–18)
Bilirubin,Total: 0.2 mg/dL (ref 0.2–1.0)
Calcium, Total: 8.6 mg/dL (ref 8.5–10.1)
Chloride: 99 mmol/L (ref 98–107)
Co2: 32 mmol/L (ref 21–32)
Creatinine: 0.84 mg/dL (ref 0.60–1.30)
EGFR (African American): >60
EGFR (Non-African Amer.): >60
Glucose: 268 mg/dL — ABNORMAL HIGH (ref 65–99)
Osmolality: 281 (ref 275–301)
Potassium: 3.5 mmol/L (ref 3.5–5.1)
SGOT(AST): 33 U/L (ref 15–37)
SGPT (ALT): 30 U/L (ref 12–78)
Sodium: 136 mmol/L (ref 136–145)
Total Protein: 7.1 g/dL (ref 6.4–8.2)

## 2013-07-17 LAB — CBC WITH DIFFERENTIAL/PLATELET
Basophil #: 0.1 10*3/uL (ref 0.0–0.1)
Basophil %: 0.9 %
EOS ABS: 0.5 10*3/uL (ref 0.0–0.7)
EOS PCT: 4.7 %
HCT: 39.7 % (ref 35.0–47.0)
HGB: 12.7 g/dL (ref 12.0–16.0)
Lymphocyte #: 2.4 10*3/uL (ref 1.0–3.6)
Lymphocyte %: 21.4 %
MCH: 29.2 pg (ref 26.0–34.0)
MCHC: 32.1 g/dL (ref 32.0–36.0)
MCV: 91 fL (ref 80–100)
Monocyte #: 0.7 x10 3/mm (ref 0.2–0.9)
Monocyte %: 6.7 %
NEUTROS ABS: 7.4 10*3/uL — AB (ref 1.4–6.5)
Neutrophil %: 66.3 %
PLATELETS: 278 10*3/uL (ref 150–440)
RBC: 4.36 10*6/uL (ref 3.80–5.20)
RDW: 14.3 % (ref 11.5–14.5)
WBC: 11.1 10*3/uL — ABNORMAL HIGH (ref 3.6–11.0)

## 2013-07-17 LAB — HCG, QUANTITATIVE, PREGNANCY: Beta Hcg, Quant.: <1 m[IU]/mL — ABNORMAL LOW

## 2013-07-17 LAB — CK: CK, Total: 58 U/L

## 2013-08-10 ENCOUNTER — Ambulatory Visit: Payer: Self-pay | Admitting: Oncology

## 2013-08-10 LAB — COMPREHENSIVE METABOLIC PANEL
ALBUMIN: 3 g/dL — AB (ref 3.4–5.0)
ALK PHOS: 58 U/L
AST: 24 U/L (ref 15–37)
Anion Gap: 7 (ref 7–16)
BUN: 11 mg/dL (ref 7–18)
Bilirubin,Total: 0.2 mg/dL (ref 0.2–1.0)
CALCIUM: 8.8 mg/dL (ref 8.5–10.1)
CREATININE: 0.7 mg/dL (ref 0.60–1.30)
Chloride: 101 mmol/L (ref 98–107)
Co2: 33 mmol/L — ABNORMAL HIGH (ref 21–32)
EGFR (Non-African Amer.): >60
Glucose: 144 mg/dL — ABNORMAL HIGH (ref 65–99)
Osmolality: 283 (ref 275–301)
POTASSIUM: 3.1 mmol/L — AB (ref 3.5–5.1)
SGPT (ALT): 28 U/L (ref 12–78)
Sodium: 141 mmol/L (ref 136–145)
TOTAL PROTEIN: 6.6 g/dL (ref 6.4–8.2)

## 2013-08-10 LAB — CBC CANCER CENTER
BASOS ABS: 0.1 x10 3/mm (ref 0.0–0.1)
Basophil %: 0.8 %
Eosinophil #: 0.5 x10 3/mm (ref 0.0–0.7)
Eosinophil %: 7.3 %
HCT: 37.3 % (ref 35.0–47.0)
HGB: 12.2 g/dL (ref 12.0–16.0)
LYMPHS ABS: 1.9 x10 3/mm (ref 1.0–3.6)
LYMPHS PCT: 28.3 %
MCH: 29.2 pg (ref 26.0–34.0)
MCHC: 32.7 g/dL (ref 32.0–36.0)
MCV: 90 fL (ref 80–100)
Monocyte #: 0.5 x10 3/mm (ref 0.2–0.9)
Monocyte %: 7 %
NEUTROS PCT: 56.6 %
Neutrophil #: 3.9 x10 3/mm (ref 1.4–6.5)
Platelet: 236 x10 3/mm (ref 150–440)
RBC: 4.17 10*6/uL (ref 3.80–5.20)
RDW: 14 % (ref 11.5–14.5)
WBC: 6.9 x10 3/mm (ref 3.6–11.0)

## 2013-08-11 LAB — CANCER ANTIGEN 27.29: CA 27.29: 13.9 U/mL (ref 0.0–38.6)

## 2013-08-12 ENCOUNTER — Ambulatory Visit: Payer: Self-pay | Admitting: Bariatrics

## 2013-08-25 ENCOUNTER — Emergency Department: Payer: Self-pay | Admitting: Emergency Medicine

## 2013-09-02 ENCOUNTER — Ambulatory Visit: Payer: Self-pay | Admitting: Oncology

## 2013-09-02 ENCOUNTER — Ambulatory Visit: Payer: Self-pay | Admitting: Bariatrics

## 2013-09-20 ENCOUNTER — Ambulatory Visit: Payer: Self-pay | Admitting: Gastroenterology

## 2013-09-21 ENCOUNTER — Ambulatory Visit: Payer: Self-pay | Admitting: Oncology

## 2013-09-22 LAB — PATHOLOGY REPORT

## 2013-10-03 ENCOUNTER — Ambulatory Visit: Payer: Self-pay | Admitting: Oncology

## 2013-10-11 DIAGNOSIS — M1710 Unilateral primary osteoarthritis, unspecified knee: Secondary | ICD-10-CM | POA: Insufficient documentation

## 2013-10-11 DIAGNOSIS — M179 Osteoarthritis of knee, unspecified: Secondary | ICD-10-CM | POA: Insufficient documentation

## 2013-10-26 DIAGNOSIS — F319 Bipolar disorder, unspecified: Secondary | ICD-10-CM | POA: Insufficient documentation

## 2013-10-26 DIAGNOSIS — G4730 Sleep apnea, unspecified: Secondary | ICD-10-CM | POA: Insufficient documentation

## 2013-10-26 DIAGNOSIS — E668 Other obesity: Secondary | ICD-10-CM | POA: Insufficient documentation

## 2013-10-26 DIAGNOSIS — F419 Anxiety disorder, unspecified: Secondary | ICD-10-CM | POA: Insufficient documentation

## 2013-10-26 DIAGNOSIS — I10 Essential (primary) hypertension: Secondary | ICD-10-CM | POA: Insufficient documentation

## 2013-10-26 DIAGNOSIS — E782 Mixed hyperlipidemia: Secondary | ICD-10-CM | POA: Insufficient documentation

## 2013-10-26 DIAGNOSIS — E119 Type 2 diabetes mellitus without complications: Secondary | ICD-10-CM | POA: Insufficient documentation

## 2013-10-26 DIAGNOSIS — C50919 Malignant neoplasm of unspecified site of unspecified female breast: Secondary | ICD-10-CM | POA: Insufficient documentation

## 2013-10-31 DIAGNOSIS — R0602 Shortness of breath: Secondary | ICD-10-CM | POA: Insufficient documentation

## 2013-11-02 ENCOUNTER — Ambulatory Visit: Payer: Self-pay | Admitting: Oncology

## 2013-12-03 ENCOUNTER — Ambulatory Visit: Payer: Self-pay | Admitting: Oncology

## 2013-12-08 ENCOUNTER — Emergency Department: Payer: Self-pay | Admitting: Emergency Medicine

## 2013-12-20 ENCOUNTER — Encounter: Payer: Self-pay | Admitting: *Deleted

## 2013-12-20 LAB — COMPREHENSIVE METABOLIC PANEL
ALBUMIN: 3.4 g/dL (ref 3.4–5.0)
ALT: 50 U/L
Alkaline Phosphatase: 58 U/L
Anion Gap: 13 (ref 7–16)
BILIRUBIN TOTAL: 0.3 mg/dL (ref 0.2–1.0)
BUN: 13 mg/dL (ref 7–18)
Calcium, Total: 8.8 mg/dL (ref 8.5–10.1)
Chloride: 100 mmol/L (ref 98–107)
Co2: 29 mmol/L (ref 21–32)
Creatinine: 0.86 mg/dL (ref 0.60–1.30)
EGFR (Non-African Amer.): >60
GLUCOSE: 169 mg/dL — AB (ref 65–99)
Osmolality: 287 (ref 275–301)
Potassium: 3 mmol/L — ABNORMAL LOW (ref 3.5–5.1)
SGOT(AST): 37 U/L (ref 15–37)
Sodium: 142 mmol/L (ref 136–145)
Total Protein: 7.5 g/dL (ref 6.4–8.2)

## 2013-12-20 LAB — CBC CANCER CENTER
BASOS ABS: 0 x10 3/mm (ref 0.0–0.1)
BASOS PCT: 0.3 %
Eosinophil #: 0.7 x10 3/mm (ref 0.0–0.7)
Eosinophil %: 8.7 %
HCT: 40.5 % (ref 35.0–47.0)
HGB: 13.4 g/dL (ref 12.0–16.0)
LYMPHS ABS: 2.8 x10 3/mm (ref 1.0–3.6)
Lymphocyte %: 34.3 %
MCH: 29.9 pg (ref 26.0–34.0)
MCHC: 33 g/dL (ref 32.0–36.0)
MCV: 91 fL (ref 80–100)
Monocyte #: 0.3 x10 3/mm (ref 0.2–0.9)
Monocyte %: 4.1 %
Neutrophil #: 4.2 x10 3/mm (ref 1.4–6.5)
Neutrophil %: 52.6 %
PLATELETS: 280 x10 3/mm (ref 150–440)
RBC: 4.48 10*6/uL (ref 3.80–5.20)
RDW: 14.6 % — AB (ref 11.5–14.5)
WBC: 8.1 x10 3/mm (ref 3.6–11.0)

## 2014-01-03 ENCOUNTER — Ambulatory Visit: Payer: Self-pay | Admitting: Oncology

## 2014-01-05 ENCOUNTER — Ambulatory Visit: Payer: Self-pay | Admitting: General Surgery

## 2014-01-25 ENCOUNTER — Encounter: Payer: Self-pay | Admitting: General Surgery

## 2014-01-25 ENCOUNTER — Ambulatory Visit (INDEPENDENT_AMBULATORY_CARE_PROVIDER_SITE_OTHER): Payer: Medicare Other | Admitting: General Surgery

## 2014-01-25 VITALS — BP 126/84 | HR 80 | Resp 14 | Ht 67.0 in | Wt 334.0 lb

## 2014-01-25 DIAGNOSIS — C50912 Malignant neoplasm of unspecified site of left female breast: Secondary | ICD-10-CM

## 2014-01-25 DIAGNOSIS — C50919 Malignant neoplasm of unspecified site of unspecified female breast: Secondary | ICD-10-CM

## 2014-01-25 NOTE — Progress Notes (Signed)
Patient ID: Sherry Price, female   DOB: October 28, 1970, 43 y.o.   MRN: [id number]  Chief Complaint  Patient presents with  . Procedure    port removal     HPI Sherry Price is a 43 y.o. female.  here today for port removal. HPI  Past Medical History  Diagnosis Date  . Diabetes mellitus without complication   . Arthritis   . Hypertension   . 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    Past Surgical History  Procedure Laterality Date  . Breast lumpectomy  2013  . Breast reduction surgery  2005  . Oophorectomy Right 2013  . Breast mammosite Left February 2013    Family History  Problem Relation Age of Onset  . Hypertension Mother   . Heart disease Mother   . Diabetes Mother     Social History History  Substance Use Topics  . Smoking status: Former Smoker -- 26 years  . Smokeless tobacco: Not on file  . Alcohol Use: Yes     Comment: occasionally    No Known Allergies  Current Outpatient Prescriptions  Medication Sig Dispense Refill  . ALPRAZolam (XANAX) 1 MG tablet Take 1 mg by mouth 2 (two) times daily.       . Furosemide (LASIX PO) Take 1 tablet by mouth daily.      . hydrochlorothiazide (HYDRODIURIL) 25 MG tablet Take 25 mg by mouth daily.       . metFORMIN (GLUCOPHAGE) 500 MG tablet Take 500 mg by mouth daily with breakfast.       . POTASSIUM PO Take 1 tablet by mouth 2 (two) times daily.      . tamoxifen (NOLVADEX) 20 MG tablet Take 20 mg by mouth daily.       Marland Kitchen venlafaxine XR (EFFEXOR-XR) 150 MG 24 hr capsule Take 150 mg by mouth daily with breakfast.        No current facility-administered medications for this visit.    Review of Systems Review of Systems  Constitutional: Negative.   Respiratory: Negative.   Cardiovascular: Negative.     Blood pressure 126/84, pulse 80, resp. rate 14, height 5' 7" (1.702 m), weight  334 lb (151.501 kg).  Physical Exam Physical Exam  Pulmonary/Chest:    Lymphadenopathy:    She has no axillary adenopathy.       Left: No supraclavicular adenopathy present.    Data Reviewed Order for port removal from medical oncology reviewed.  Assessment    Completion of adjuvant therapy post wide excision and partial breast radiation.    Plan    The procedure was reviewed and the patient was amenable to proceed. 10 cc of 0.5% Xylocaine with 0.25% Marcaine with 1 200,000 units of epinephrine was utilized well tolerated. This was supplemented with 5 cc of 1% plain Xylocaine. ChloraPrep was applied to the skin.  A new incision for port extraction was made half the distance between the original incision which showed moderate keloid formation and the port itself. The port was mobilized and transfixion sutures removed. It was extracted without difficulty from the internal jugular vein. The catheter was intact. The wound was closed in layers with 3-0 Vicryl to the adipose tissue and a running 3-0 Vicryl subject was sutured to the skin. Benzoin and Steri-Strips followed by Telfa and Tegaderm dressing was applied.  Postbiopsy wound care  instructions were provided. The patient will follow up in one week for wound evaluation with the staff.       PCP: Dr Lennox Grumbles REF: Dr Zenia Resides, [facility] Gleason 01/26/2014, 12:24 PM

## 2014-01-25 NOTE — Patient Instructions (Signed)
1 week nurse  

## 2014-01-26 ENCOUNTER — Encounter: Payer: Self-pay | Admitting: General Surgery

## 2014-02-01 ENCOUNTER — Ambulatory Visit (INDEPENDENT_AMBULATORY_CARE_PROVIDER_SITE_OTHER): Payer: Self-pay | Admitting: *Deleted

## 2014-02-01 DIAGNOSIS — C50919 Malignant neoplasm of unspecified site of unspecified female breast: Secondary | ICD-10-CM

## 2014-02-01 DIAGNOSIS — C50912 Malignant neoplasm of unspecified site of left female breast: Secondary | ICD-10-CM

## 2014-02-01 NOTE — Patient Instructions (Signed)
The patient is aware to call back for any questions or concerns.  

## 2014-02-01 NOTE — Progress Notes (Signed)
Patient came in today for a wound check/post port removal.  The wound is clean, with no signs of infection noted. Follow up as needed.

## 2014-02-28 ENCOUNTER — Encounter: Payer: Self-pay | Admitting: General Surgery

## 2014-03-06 ENCOUNTER — Encounter: Payer: Self-pay | Admitting: General Surgery

## 2014-04-17 ENCOUNTER — Emergency Department: Payer: Self-pay | Admitting: Emergency Medicine

## 2014-05-08 ENCOUNTER — Emergency Department: Payer: Self-pay | Admitting: Emergency Medicine

## 2014-05-08 LAB — URINALYSIS, COMPLETE
BACTERIA: NONE SEEN
BILIRUBIN, UR: NEGATIVE
BLOOD: NEGATIVE
GLUCOSE, UR: NEGATIVE mg/dL (ref 0–75)
Ketone: NEGATIVE
Leukocyte Esterase: NEGATIVE
Nitrite: NEGATIVE
Ph: 5 (ref 4.5–8.0)
Protein: NEGATIVE
RBC,UR: 1 /HPF (ref 0–5)
Specific Gravity: 1.028 (ref 1.003–1.030)
WBC UR: 1 /HPF (ref 0–5)

## 2014-05-14 ENCOUNTER — Emergency Department: Payer: Self-pay | Admitting: Internal Medicine

## 2014-05-14 LAB — COMPREHENSIVE METABOLIC PANEL
ANION GAP: 7 (ref 7–16)
AST: 21 U/L (ref 15–37)
Albumin: 3.1 g/dL — ABNORMAL LOW (ref 3.4–5.0)
Alkaline Phosphatase: 55 U/L
BILIRUBIN TOTAL: 0.2 mg/dL (ref 0.2–1.0)
BUN: 13 mg/dL (ref 7–18)
CHLORIDE: 105 mmol/L (ref 98–107)
Calcium, Total: 8.3 mg/dL — ABNORMAL LOW (ref 8.5–10.1)
Co2: 30 mmol/L (ref 21–32)
Creatinine: 0.77 mg/dL (ref 0.60–1.30)
EGFR (African American): >60
EGFR (Non-African Amer.): >60
Glucose: 114 mg/dL — ABNORMAL HIGH (ref 65–99)
Osmolality: 284 (ref 275–301)
Potassium: 3.7 mmol/L (ref 3.5–5.1)
SGPT (ALT): 19 U/L
SODIUM: 142 mmol/L (ref 136–145)
TOTAL PROTEIN: 6.7 g/dL (ref 6.4–8.2)

## 2014-05-14 LAB — CBC
HCT: 38.2 % (ref 35.0–47.0)
HGB: 12.4 g/dL (ref 12.0–16.0)
MCH: 29.7 pg (ref 26.0–34.0)
MCHC: 32.4 g/dL (ref 32.0–36.0)
MCV: 92 fL (ref 80–100)
PLATELETS: 256 10*3/uL (ref 150–440)
RBC: 4.17 10*6/uL (ref 3.80–5.20)
RDW: 14.6 % — ABNORMAL HIGH (ref 11.5–14.5)
WBC: 9.9 10*3/uL (ref 3.6–11.0)

## 2014-06-19 ENCOUNTER — Ambulatory Visit: Payer: Self-pay | Admitting: Oncology

## 2014-07-04 ENCOUNTER — Ambulatory Visit
Admit: 2014-07-04 | Disposition: A | Discharge status: Home / Self Care | Payer: Self-pay | Attending: Oncology | Admitting: Oncology

## 2014-08-03 ENCOUNTER — Emergency Department
Admit: 2014-08-03 | Disposition: A | Discharge status: Home / Self Care | Payer: Self-pay | Admitting: Emergency Medicine

## 2014-08-03 LAB — COMPREHENSIVE METABOLIC PANEL
ALBUMIN: 4.4 g/dL
ALK PHOS: 48 U/L
AST: 34 U/L
Anion Gap: 9 (ref 7–16)
BUN: 12 mg/dL
Bilirubin,Total: 0.3 mg/dL
CALCIUM: 9.1 mg/dL
Chloride: 98 mmol/L — ABNORMAL LOW
Co2: 28 mmol/L
Creatinine: 0.68 mg/dL
EGFR (Non-African Amer.): >60
GLUCOSE: 140 mg/dL — AB
Potassium: 3.4 mmol/L — ABNORMAL LOW
SGPT (ALT): 31 U/L
Sodium: 135 mmol/L
Total Protein: 7.9 g/dL

## 2014-08-03 LAB — URINALYSIS, COMPLETE
BLOOD: NEGATIVE
Bacteria: NONE SEEN
Bilirubin,UR: NEGATIVE
Glucose,UR: NEGATIVE mg/dL (ref 0–75)
Leukocyte Esterase: NEGATIVE
NITRITE: NEGATIVE
Ph: 6 (ref 4.5–8.0)
Protein: NEGATIVE
RBC,UR: 1 /HPF (ref 0–5)
SPECIFIC GRAVITY: 1.009 (ref 1.003–1.030)
Squamous Epithelial: 1

## 2014-08-03 LAB — CBC
HCT: 42.3 % (ref 35.0–47.0)
HGB: 13.6 g/dL (ref 12.0–16.0)
MCH: 29.1 pg (ref 26.0–34.0)
MCHC: 32.1 g/dL (ref 32.0–36.0)
MCV: 90 fL (ref 80–100)
Platelet: 326 10*3/uL (ref 150–440)
RBC: 4.68 10*6/uL (ref 3.80–5.20)
RDW: 14.3 % (ref 11.5–14.5)
WBC: 10.5 10*3/uL (ref 3.6–11.0)

## 2014-08-09 ENCOUNTER — Ambulatory Visit
Admit: 2014-08-09 | Disposition: A | Discharge status: Home / Self Care | Payer: Self-pay | Attending: Oncology | Admitting: Oncology

## 2014-08-27 NOTE — Op Note (Signed)
PATIENT NAME:  Sherry Price, Sherry Price MR#:  [id number] DATE OF BIRTH:  [birth year]  DATE OF PROCEDURE:  06/25/2011  PREOPERATIVE DIAGNOSIS: Breast cancer, need for central venous access.   POSTOPERATIVE DIAGNOSIS: Breast cancer, need for central venous access.   OPERATIVE PROCEDURE: Right internal jugular power port placement.   OPERATING SURGEON: Robert Bellow, MD   ANESTHESIA: Attended local, 15 mL 1% plain Xylocaine.   ESTIMATED BLOOD LOSS: Less than 5 mL.   CLINICAL NOTE: This 44 year old woman was recently identified with left breast cancer. She was felt to be a candidate for adjuvant chemotherapy. Central venous access was requested by her treating oncologist.   OPERATIVE NOTE: The patient received Kefzol prior to the procedure. Of note, her venous access for the procedure was a vein along the right thumb. The patient was placed in Trendelenburg position. Ultrasound was used to assess the subclavian vessel. This could not be visualized on ultrasound. It was elected to make use of the internal jugular vein. This was cannulated under ultrasound guidance. The guidewire was passed and the catheter positioned into the distal SVC under fluoroscopy. This was done with the patient in the supine position. It was then tunneled to a pocket on the right anterior chest. The port was sutured to the deep tissue with interrupted 3-0 Prolene sutures. It easily irrigated and aspirated with the patient in the supine position. The wound was closed with a running 3-0 Vicryl to the adipose layer and running 4-0 Vicryl subcuticular suture for the skin. The IJ puncture site was closed with a single 4-0 Vicryl subcuticular suture. Benzoin, Steri-Strips, Telfa, and Tegaderm dressings were applied.       Erect chest x-ray in the recovery room showed no evidence of pneumothorax, the catheter tip in the distal SVC. The patient tolerated the procedure well.   ____________________________ Robert Bellow,  MD jwb:drc D: 06/25/2011 20:21:02 ET T: 06/26/2011 09:15:19 ET JOB#: [id number]  cc: Robert Bellow, MD, <Dictator> Martie Lee. Oliva Bustard, MD Rhett Najera Amedeo Kinsman MD ELECTRONICALLY SIGNED 06/26/2011 13:50

## 2014-08-27 NOTE — Op Note (Signed)
PATIENT NAME:  Sherry Price, Sherry Price MR#:  [id number] DATE OF BIRTH:  Jun 15, 1970  DATE OF PROCEDURE:  05/21/2011  PREOPERATIVE DIAGNOSIS: Left breast cancer.   POSTOPERATIVE DIAGNOSIS: Left breast cancer.  OPERATIVE PROCEDURE: Wide excision with sentinel node biopsy.   SURGEON: Hervey Ard, MD  ANESTHESIA: General endotracheal under Dr. [place] Service, Marcaine 0.5% with 1:200,000 units of epinephrine, 30 mL local infiltration.   ESTIMATED BLOOD LOSS: Less than 10 mL.   CLINICAL NOTE: This 44 year old woman was noted on screening mammogram to have a left breast mass core biopsy of which showed evidence of infiltrating carcinoma. She desired breast conservation. She was injected with technetium sulfur colloid prior to the procedure. She received Kefzol 2 grams intravenously on induction of anesthesia.   OPERATIVE NOTE: With the patient under adequate general endotracheal anesthesia, the breast was prepped with ChloraPrep and draped. A total of 4 mL of methylene blue diluted 1:2 with normal saline was injected sterilely and then the patient draped. Examination of the axilla with the gamma finder showed no evidence of focal increased uptake. A transverse incision was made at the lower hairline and carried down through a generous layer of adipose tissue. A single blue node without uptake was identified and sent for analysis. Touch preps were negative for macro metastases. Palpation through the axilla and scanning with the gamma finder showed no other nodes. The wound was then closed with 2-0 Vicryl to the adipose layer and running 4-0 Vicryl subcuticular suture for the skin.   Attention was turned to the breast. Ultrasound was used to identify the previously biopsied nodule and just immediately adjacent to this was a second nodule. It was unclear whether this was a hematoma from the original vacuum biopsy. The lesion was approximately 3 cm deep from the skin and the overlying skin was infiltrated with the  above-mentioned local anesthetic and a radial incision made. A generous layer of adipose tissue was divided and then wide dissection carried out down to but not including the underlying pectoralis fascia. The specimen was orientated and specimen radiograph confirmed a clip in the more lateral of the two dense areas. Gross examination showed no clearly positive margins and the wound was closed in layers with 2-0 Vicryl figure-of-eight sutures and the skin closed with a running 4-0 Vicryl subcuticular suture. The wounds were then covered with Telfa and Tegaderm dressings.   Because of the patient's size, it was not possible to provide a compressive wrap, but her bra will be placed on her when it becomes available.  ____________________________ Robert Bellow, MD jwb:slb D: 05/21/2011 14:14:30 ET T: 05/21/2011 14:35:07 ET JOB#: [id number]  cc: Robert Bellow, MD, <Dictator> Marguerita Merles, MD Randalyn Ahmed Amedeo Kinsman MD ELECTRONICALLY SIGNED 05/22/2011 15:25

## 2014-08-27 NOTE — Consult Note (Signed)
Reason for Visit: This 44 year old Female patient presents to the clinic for initial evaluation of  Breast cancer .   Referred by Dr. Terri Piedra.  Diagnosis:   Chief Complaint/Diagnosis   44 year old female with 2 adjacent separate primaries of the left breast status post wide local excision the largest being a T1 C. N0 M0 lesion the other a T1 B. N0 lesion both triple negative status post wide local excision and sentinel node biopsy   Pathology Report Pathology report reviewed    Imaging Report Mammograms reviewed    Referral Report Clinical notes reviewed    Planned Treatment Regimen Possibly accelerated partial breast radiation    HPI   patient is a 44 year old female who presents with an abnormal mammogram her left breast. Patient is morbidly obese weighing in her 360 pounds. She underwent initial biopsy which was positive for infiltrating ductal carcinoma. She underwent wide local excision and 2 separate foci one was 1.8 cm the other 1.0 cm both triple negative. Sentinel node was negative. Patient is status post breast reductions in the past although still has large pendulous breasts. She also recently had a pelvic benign dermoid cyst removed from her ovary although I do not have formal pathology on that for my review. She has been slow to heal from that abdominal incision. She seen today by both medical oncology and radiation oncology for opinion.  Past Hx:    Breast Cancer:    Sleep Apnea:    Panic Attacks:    Diabetes:    Anxiety:    uti's:    chronic lower back pain.:    Bipolar Disorder:    Borderline diabetes:    Depression:    HTN:    Breast Surgery - reduction:   Past, Family and Social History:   Past Medical History positive    Cardiovascular hypertension    Respiratory Sleep apnea is    Endocrine diabetes mellitus    Neurological/Psychiatric anxiety; depression    Past Medical History Comments Chronic lower back pain    Family History positive     Family History Comments Family history lung cancer, breast cancer, adult onset diabetes coronary vascular heart disease and renal disease    Social History noncontributory    Additional Past Medical and Surgical History Accompanied by nurse navigator today   Allergies:   No Known Allergies:   Home Meds:  Home Medications:  acetaminophen-oxyCODONE 325 mg-5 mg tablet: 1 tab(s) orally four times per day, as needed for pain., Active  cetirizine 10 mg oral tablet: 1 tab(s) orally once a day in am, Active  hydrochlorothiazide 25 mg oral tablet: 1 tab(s) orally once a day in am, Active  metformin 500 mg oral tablet: 2 tabs orally daily in am, Active  multivitamin: 1 tab(s) orally once a day in am, Active  pantoprazole 40 mg oral delayed release tablet: 1 tab(s) orally once a day with evening meal, Active  Patanase 665 mcg/inh nasal spray: 1 spray to each nostril BID, Active  Calcium 600+D: 400  orally 2 times a day, Active  promethazine 25 mg oral tablet: tab(s) orally every 4 hours, As Needed- for Nausea, Vomiting , Active  alprazolam 1 mg oral tablet: 1 tab(s) orally 3 times a day, As Needed- for Anxiety, Nervousness , Active  Review of Systems:   Performance Status (ECOG) 0    Skin negative    Breast see HPI    Ophthalmologic negative    ENMT negative    Respiratory and  Thorax negative    Cardiovascular see HPI    Gastrointestinal see HPI    Genitourinary negative    Musculoskeletal negative    Neurological negative    Psychiatric see HPI    Hematology/Lymphatics negative    Endocrine see HPI    Allergic/Immunologic negative   Nursing Notes:  Nursing Vital Signs and Chemo Nursing Nursing Notes: *CC Vital Signs Flowsheet:   23-Jan-13 13:36   Temp Temperature 97.7   Pulse Pulse 94   Respirations Respirations 18   SBP SBP 154   DBP DBP 79   Current Weight (kg) (kg) 161.9   Height (cm) centimeters 168.3   BSA (m2) 2.5   Physical Exam:   General/Skin/HEENT:   Skin normal    Eyes normal    ENMT normal    Head and Neck normal    Additional PE Well-developed morbidly obese female in NAD. Patient had bilateral breast reduction surgery in the past. She status post wide local excision of the left breast. Incision is healing well no dominant mass or nodularity is noted in either breast into position examined. No axillary or supraclavicular adenopathy is appreciated. She status post recent abdominal incision with incision site healing well.   Breasts/Resp/CV/GI/GU:   Respiratory and Thorax normal    Cardiovascular normal    Gastrointestinal normal    Genitourinary normal   MS/Neuro/Psych/Lymph:   Musculoskeletal normal    Neurological normal    Lymphatics normal   Other Results:  Radiology Results: [place]:    18-Oct-12 17:09, US Breast Bilateral   US Breast Bilateral    REASON FOR EXAM:      COMMENTS:       PROCEDURE: [place]  - US BREAST BILATERAL  - Feb 20 2011  5:09PM     RESULT:  On the patient's screening study of 17 October, nodules were   demonstrated in the superolateral aspects of both breasts.     On today's study there is a solid appearing fairly homogeneous ovoid   hypoechoic nodule on the right with a small amount of enhanced through   transmission. This lies at the 9 o'clock position and measures 2.1 x 1 x   1.8 cm. It does not appear hypervascular.  This corresponds to a dominant   density seen in this region mammographically. A second benign appering   ovoid nodule measuring 2 x 0.7 x 1.4 cm at the 9 o'clock position is   present.  On the left, at the 3 o'clock position there is a rounded hypoechoic   nodule with enhanced through transmission which measures approximately 9   mm in diameter.  A second adjacent hypoechoic nodule measures 7 x 9 x 10   mm.  It does not exhibit classic distal shadowing or significant enhanced   through transmission.    IMPRESSION:      1.On the right, there is an  ovoid hypoechoic homogenous appearing mass at   the 9 o'clock position corresponding to the dominant density seen here.    This is felt to be most likely benign.  A second benign-appearing   lobulated structure demonstrating some enhanced through transmission is   seen in the adjacent soft tissues. These findings are not felt to be   suspicious for malignancy.    2.On the left, at the 3 o'clock position there is a benign appearing   homogenous rounded nodule measuring 9 mm in diameter. There is a second   irregularly marginated structure measuring 7 x 9 x  10 mm at the 9 o'clock   position that is indeterminate.    The findings on the left appear new. Surgical consultation is recommended   for the irregularly marginated hypoechoic nodule. This nodule could be   localized using ultrasound guidance.  Alternatively, ultrasound-directed   biopsy could be considered. It is likely that this nodule represents the   more anterior of the mammographically evident nodules.    BI-RADS: Category 4 - Suspicious Abnormality    A NEGATIVE MAMMOGRAM REPORT DOES NOT PRECLUDE BIOPSY OR OTHER EVALUATION   OF A CLINICALLY PALPABLE OR OTHERWISE SUSPICIOUS MASS OR LESION. BREAST     CANCER MAY NOT BE DETECTED BY MAMMOGRAPHY IN UP TO 10% OF CASES.      Thank you for the opportunity to contribute to the care of your patient.      Addendum: In the impression of the ultrasound report of 20 February 2011 I   missed misspoke in the second paragraph. The 7 x 9 x 10 mm diameter   irregularly marginated structure said to be at the 9:00 position is in   fact at the 3:00 position.     This addendum was created on 14 March 2011 at 1:30 p.m.          Verified By: Ralene Cork . DEFAULT, M.D.,  [facility]:    17-Oct-12 17:19, Digital Screen Mammogram   Digital Screen Mammogram    REASON FOR EXAM:    SCR MAMMO  COMMENTS:       PROCEDURE: MAM - MAM DGTL SCREENING MAMMO W/CAD  - Feb 19 2011  5:19PM     RESULT:  The patient has a history of previous breast reduction in 2005.   Comparison is made to a digital study of 12 July 2009, as well as to a   film screen study dated 02 April 2005.     The breasts exhibit an involutional pattern with numerous nodular   densities bilaterally. The appearance of these densities is not greatly   changed. Allowing for differences in positioning many of these likely   reflect intramammary lymph nodes. I see no malignant appearing grouping   of microcalcification.     Both breasts demonstrate new areas of nodularity more inferiorly   positioned than the aforementioned areas of stable nodularity. These lie   along the equator of the breast and slightly superior to this in the   lateral portion of both breasts.     IMPRESSION:     1.There are fairly smoothly marginated nodules in the lateral aspects of   both breasts slightly above theequator.  These are indeterminate. I feel   it would be reasonable for the patient to undergo ultrasound examination   of the upper outer aspects of both breasts. Ultrasound of the axillary   regions is not felt to be indicated at this time.     BI-RADS: Category 0-Needs Additional Imaging Evaluation      Bilateral upper outer quadrant ultrasound is recommended to better   characterize new nodules.     A NEGATIVE MAMMOGRAM REPORT DOES NOT PRECLUDE BIOPSY OR OTHER EVALUATION   OF A CLINICALLY PALPABLE OR OTHERWISE SUSPICIOUS MASS OR LESION. BREAST   CANCER MAY NOT BE DETECTED BY MAMMOGRAPHY IN UP TO 10% OF CASES.       Thank you for the opportunity to contribute to the care of your patient.            Verified By: DAVID A. Martinique, M.D.,  MD   Assessment and Plan:   Impression   2 separate T1 invasive mammary carcinomas of the left breast status post wide local excision of the same quadrant in morbidly obese 44 year old female with triple negative disease.   Plan   I have discussed the case personally with Dr. Oliva Bustard  the medical oncologist. We will be obtaining ER/PR status of the separate 2nd lesion to make sure they are both of the same hormone receptor status. Based on the patient's morbid obesity and large pendulous breasts believe that accelerated partial breast radiation may be her best choice for treatment. Based on her age she does not fit strict criteria for accelerated partial breast irradiation although with our treatment table limitations of weight were overall mobility and large breasts believe this would be the best course of treatment. This was all discussed and explained to the patient with the nurse navigator present.We will talk to Dr. Terri Piedra about placing MammoSite catheter. She catheter not be able to be placed either secondary close proximity to the skin or chest wall will resort to whole breast radiation. Patient will also have Oncotype DX run for recurrence risk assessment for systemic chemotherapy. All this was explained to the patient and discussed by Dr. Oliva Bustard personally.  I would like to take this opportunity to thank you for allowing me to continue to participate in this patient's care.  CC Referral:   cc: Dr. Delight Stare, Dr. Terri Piedra   Electronic Signatures: Armstead Peaks (MD)  (Signed 23-Jan-13 14:46)  Authored: HPI, Diagnosis, Past Hx, PFSH, Allergies, Home Meds, ROS, Nursing Notes, Physical Exam, Other Results, Encounter Assessment and Plan, CC Referring Physician   Last Updated: 23-Jan-13 14:46 by Armstead Peaks (MD)

## 2014-09-21 ENCOUNTER — Ambulatory Visit: Payer: Medicare Other | Attending: Specialist

## 2014-09-29 ENCOUNTER — Encounter: Payer: Self-pay | Admitting: Emergency Medicine

## 2014-09-29 DIAGNOSIS — I10 Essential (primary) hypertension: Secondary | ICD-10-CM | POA: Diagnosis not present

## 2014-09-29 DIAGNOSIS — Z79899 Other long term (current) drug therapy: Secondary | ICD-10-CM | POA: Insufficient documentation

## 2014-09-29 DIAGNOSIS — R197 Diarrhea, unspecified: Secondary | ICD-10-CM | POA: Diagnosis not present

## 2014-09-29 DIAGNOSIS — Z3202 Encounter for pregnancy test, result negative: Secondary | ICD-10-CM | POA: Insufficient documentation

## 2014-09-29 DIAGNOSIS — Z87891 Personal history of nicotine dependence: Secondary | ICD-10-CM | POA: Diagnosis not present

## 2014-09-29 DIAGNOSIS — R1011 Right upper quadrant pain: Secondary | ICD-10-CM | POA: Diagnosis not present

## 2014-09-29 DIAGNOSIS — E119 Type 2 diabetes mellitus without complications: Secondary | ICD-10-CM | POA: Diagnosis not present

## 2014-09-29 DIAGNOSIS — R112 Nausea with vomiting, unspecified: Secondary | ICD-10-CM | POA: Insufficient documentation

## 2014-09-29 DIAGNOSIS — R1012 Left upper quadrant pain: Secondary | ICD-10-CM | POA: Diagnosis present

## 2014-09-29 LAB — CBC WITH DIFFERENTIAL/PLATELET
BASOS ABS: 0.1 10*3/uL (ref 0–0.1)
BASOS PCT: 1 %
EOS ABS: 0.1 10*3/uL (ref 0–0.7)
EOS PCT: 1 %
HEMATOCRIT: 38.7 % (ref 35.0–47.0)
Hemoglobin: 12.5 g/dL (ref 12.0–16.0)
LYMPHS PCT: 20 %
Lymphs Abs: 2.3 10*3/uL (ref 1.0–3.6)
MCH: 28.8 pg (ref 26.0–34.0)
MCHC: 32.2 g/dL (ref 32.0–36.0)
MCV: 89.4 fL (ref 80.0–100.0)
MONO ABS: 0.5 10*3/uL (ref 0.2–0.9)
Monocytes Relative: 4 %
NEUTROS ABS: 8.3 10*3/uL — AB (ref 1.4–6.5)
Neutrophils Relative %: 74 %
PLATELETS: 311 10*3/uL (ref 150–440)
RBC: 4.33 MIL/uL (ref 3.80–5.20)
RDW: 14.4 % (ref 11.5–14.5)
WBC: 11.3 10*3/uL — AB (ref 3.6–11.0)

## 2014-09-29 LAB — URINALYSIS COMPLETE WITH MICROSCOPIC (ARMC ONLY)
Bilirubin Urine: NEGATIVE
Glucose, UA: NEGATIVE mg/dL
HGB URINE DIPSTICK: NEGATIVE
LEUKOCYTES UA: NEGATIVE
NITRITE: NEGATIVE
PH: 6 (ref 5.0–8.0)
Protein, ur: NEGATIVE mg/dL
SPECIFIC GRAVITY, URINE: 1.026 (ref 1.005–1.030)

## 2014-09-29 LAB — COMPREHENSIVE METABOLIC PANEL
ALK PHOS: 54 U/L (ref 38–126)
ALT: 18 U/L (ref 14–54)
AST: 22 U/L (ref 15–41)
Albumin: 3.8 g/dL (ref 3.5–5.0)
Anion gap: 11 (ref 5–15)
BUN: 16 mg/dL (ref 6–20)
CO2: 30 mmol/L (ref 22–32)
CREATININE: 0.69 mg/dL (ref 0.44–1.00)
Calcium: 9.2 mg/dL (ref 8.9–10.3)
Chloride: 101 mmol/L (ref 101–111)
GFR calc Af Amer: >60 mL/min (ref >60)
GFR calc non Af Amer: >60 mL/min (ref >60)
Glucose, Bld: 120 mg/dL — ABNORMAL HIGH (ref 65–99)
Potassium: 3.8 mmol/L (ref 3.5–5.1)
SODIUM: 142 mmol/L (ref 135–145)
Total Protein: 7.5 g/dL (ref 6.5–8.1)

## 2014-09-29 LAB — GLUCOSE, CAPILLARY: GLUCOSE-CAPILLARY: 120 mg/dL — AB (ref 65–99)

## 2014-09-29 LAB — LIPASE, BLOOD: Lipase: 20 U/L — ABNORMAL LOW (ref 22–51)

## 2014-09-29 NOTE — ED Notes (Signed)
Pt presents to ED with c/o vomiting x 2, diarrhea x 1 and abdominal pain. Pt reports that she exercised a lot today and began to feel bad around 450 this afternoon. Pt is awake and alert during triage.

## 2014-09-30 ENCOUNTER — Encounter: Payer: Self-pay | Admitting: Radiology

## 2014-09-30 ENCOUNTER — Other Ambulatory Visit: Payer: Self-pay

## 2014-09-30 ENCOUNTER — Emergency Department
Admission: EM | Admit: 2014-09-30 | Discharge: 2014-09-30 | Disposition: A | Discharge status: Home / Self Care | Payer: Medicare Other | Attending: Student | Admitting: Student

## 2014-09-30 ENCOUNTER — Emergency Department: Payer: Medicare Other

## 2014-09-30 DIAGNOSIS — R109 Unspecified abdominal pain: Secondary | ICD-10-CM

## 2014-09-30 HISTORY — DX: Sleep apnea, unspecified: G47.30

## 2014-09-30 HISTORY — DX: Panic disorder (episodic paroxysmal anxiety): F41.0

## 2014-09-30 LAB — PREGNANCY, URINE: PREG TEST UR: NEGATIVE — AB

## 2014-09-30 LAB — TROPONIN I

## 2014-09-30 MED ORDER — IOHEXOL 350 MG/ML SOLN
125.0000 mL | Freq: Once | INTRAVENOUS | Status: AC | PRN
  Administered 2014-09-30: 125 mL via INTRAVENOUS

## 2014-09-30 MED ORDER — MORPHINE SULFATE 4 MG/ML IJ SOLN
INTRAMUSCULAR | Status: DC
Start: 2014-09-30 — End: 2014-09-30
  Filled 2014-09-30: qty 1

## 2014-09-30 MED ORDER — ONDANSETRON HCL 4 MG/2ML IJ SOLN
INTRAMUSCULAR | Status: AC
  Filled 2014-09-30: qty 2

## 2014-09-30 MED ORDER — MORPHINE SULFATE 4 MG/ML IJ SOLN
4.0000 mg | Freq: Once | INTRAMUSCULAR | Status: AC
  Administered 2014-09-30: 4 mg via INTRAVENOUS

## 2014-09-30 MED ORDER — SODIUM CHLORIDE 0.9 % IV BOLUS (SEPSIS)
1000.0000 mL | Freq: Once | INTRAVENOUS | Status: AC
  Administered 2014-09-30: 1000 mL via INTRAVENOUS

## 2014-09-30 MED ORDER — MORPHINE SULFATE 4 MG/ML IJ SOLN
INTRAMUSCULAR | Status: AC
  Filled 2014-09-30: qty 1

## 2014-09-30 MED ORDER — ONDANSETRON HCL 4 MG/2ML IJ SOLN
4.0000 mg | Freq: Once | INTRAMUSCULAR | Status: AC
Start: 2014-09-30 — End: 2014-09-30
  Administered 2014-09-30: 4 mg via INTRAVENOUS

## 2014-09-30 MED ORDER — FAMOTIDINE IN NACL 20-0.9 MG/50ML-% IV SOLN
INTRAVENOUS | Status: AC
  Filled 2014-09-30: qty 50

## 2014-09-30 MED ORDER — FAMOTIDINE IN NACL 20-0.9 MG/50ML-% IV SOLN
20.0000 mg | Freq: Once | INTRAVENOUS | Status: AC
  Administered 2014-09-30: 20 mg via INTRAVENOUS

## 2014-09-30 MED ORDER — ONDANSETRON HCL 4 MG/2ML IJ SOLN
4.0000 mg | Freq: Once | INTRAMUSCULAR | Status: AC
  Administered 2014-09-30: 4 mg via INTRAVENOUS

## 2014-09-30 MED ORDER — IOHEXOL 240 MG/ML SOLN
25.0000 mL | Freq: Once | INTRAMUSCULAR | Status: AC | PRN
  Administered 2014-09-30: 25 mL via ORAL

## 2014-09-30 NOTE — ED Notes (Signed)
D/c instructions reviewed w/ pt - pt denies any further questions or concerns at present.

## 2014-09-30 NOTE — ED Notes (Signed)
Patient denies pain and is resting comfortably.  

## 2014-09-30 NOTE — ED Notes (Signed)
Pt sleeping. 

## 2014-09-30 NOTE — ED Notes (Signed)
Patient transported to Ultrasound 

## 2014-09-30 NOTE — ED Notes (Signed)
Provided patient with graham crackers, peanut butter and gingerale. MD at bedside discussing CT scan and patient plan.  Patient respirations even and unlabored. No obvious distress. Cardiac monitor in place. No other needs/concerns verbalized at this time. Call bell within reach. Encouraged to call with needs. Will continue to monitor.

## 2014-09-30 NOTE — ED Notes (Signed)
Report to jean jourdan, rn.

## 2014-09-30 NOTE — ED Notes (Signed)
Pt has finished po contrast. Ct notified. Pt requesting additional pain and nausea medication. md notified.

## 2014-09-30 NOTE — ED Provider Notes (Signed)
[facility] Emergency Department Provider Note  ____________________________________________  Time seen: Approximately 0300 AM  I have reviewed the triage vital signs and the nursing notes.   HISTORY  Chief Complaint Emesis and Abdominal Pain    HPI Sherry Price is a 44 y.o. female with a history of diabetes and past history of breast cancer presents with abdominal pain since 6 PM. Says worked out this evening and then started feeling nauseous with abdominal pain several episodes of vomiting without blood in the vomitus. No diarrhea. No known sick contacts. Pain is in the upper abdomen and moderate and cramping. Also had some cramping to the left arm but says gets this often after going to the gym which she did today. The pain is unchanged from her baseline pain.   Past Medical History  Diagnosis Date  . Diabetes mellitus without complication   . Arthritis   . Hypertension   . 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  . Sleep apnea   . Panic attacks     Patient Active Problem List   Diagnosis Date Noted  . Malignant neoplasm of 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 05/21/2011    Past Surgical History  Procedure Laterality Date  . Breast lumpectomy  2013  . Breast reduction surgery  2005  . Oophorectomy Right 2013  . Breast mammosite Left February 2013    Current Outpatient Rx  Name  Route  Sig  Dispense  Refill  . ALPRAZolam (XANAX) 1 MG tablet   Oral   Take 1 mg by mouth 2 (two) times daily.          . Furosemide (LASIX PO)   Oral   Take 1 tablet by mouth daily.         Marland Kitchen ibuprofen (ADVIL,MOTRIN) 200 MG tablet   Oral   Take 200 mg by mouth.         . losartan (COZAAR) 25 MG tablet   Oral   Take 25 mg by mouth daily.         . metFORMIN (GLUCOPHAGE) 500 MG tablet   Oral   Take 500  mg by mouth daily with breakfast.          . POTASSIUM PO   Oral   Take 1 tablet by mouth 2 (two) times daily.         . tamoxifen (NOLVADEX) 20 MG tablet   Oral   Take 20 mg by mouth daily.          Marland Kitchen venlafaxine XR (EFFEXOR-XR) 150 MG 24 hr capsule   Oral   Take 150 mg by mouth daily with breakfast.          . hydrochlorothiazide (HYDRODIURIL) 25 MG tablet   Oral   Take 25 mg by mouth daily.            Allergies Review of patient's allergies indicates no known allergies.  Family History  Problem Relation Age of Onset  . Hypertension Mother   . Heart disease Mother   . Diabetes Mother     Social History History  Substance Use Topics  . Smoking status: Former Smoker -- 26 years  . Smokeless tobacco: Not on file  . Alcohol Use: Yes     Comment: occasionally    Review of Systems Constitutional: No fever/chills Eyes: No visual changes.  ENT: No sore throat. Cardiovascular: Denies chest pain. Respiratory: Denies shortness of breath. Gastrointestinal: As above  Genitourinary: Negative for dysuria. Musculoskeletal: Negative for back pain. Skin: Negative for rash. Neurological: Negative for headaches, focal weakness or numbness.  10-point ROS otherwise negative.  ____________________________________________   PHYSICAL EXAM:  VITAL SIGNS: ED Triage Vitals  Enc Vitals Group     BP 09/29/14 2212 145/88 mmHg     Pulse Rate 09/29/14 2212 85     Resp 09/29/14 2212 18     Temp 09/29/14 2212 98 F (36.7 C)     Temp Source 09/29/14 2212 Oral     SpO2 09/29/14 2212 100 %     Weight 09/29/14 2212 322 lb (146.058 kg)     Height 09/29/14 2212 5' 7" (1.702 m)     Head Cir --      Peak Flow --      Pain Score 09/29/14 2214 4     Pain Loc --      Pain Edu? --      Excl. in [place]? --     Constitutional: Alert and oriented. Well appearing and in no acute distress. Eyes: Conjunctivae are normal. PERRL. EOMI. Head: Atraumatic. Nose: No  congestion/rhinnorhea. Mouth/Throat: Mucous membranes are moist.  Oropharynx non-erythematous. Neck: No stridor.   Cardiovascular: Normal rate, regular rhythm. Grossly normal heart sounds.  Good peripheral circulation. Respiratory: Normal respiratory effort.  No retractions. Lungs CTAB. Gastrointestinal: Soft.  no distention. Mild tenderness palpation to the left upper and right upper quadrant. Negative Murphy sign. There is no tenderness in lower quadrants. There is no pain over the McBurney's point. There is no periumbilical pain either. No abdominal bruits. No CVA tenderness. Musculoskeletal: No lower extremity tenderness nor edema.  No joint effusions. Neurologic:  Normal speech and language. No gross focal neurologic deficits are appreciated. Speech is normal. No gait instability. Skin:  Skin is warm, dry and intact. No rash noted. Psychiatric: Mood and affect are normal. Speech and behavior are normal.  ____________________________________________   LABS (all labs ordered are listed, but only abnormal results are displayed)  Labs Reviewed  CBC WITH DIFFERENTIAL/PLATELET - Abnormal; Notable for the following:    WBC 11.3 (*)    Neutro Abs 8.3 (*)    All other components within normal limits  COMPREHENSIVE METABOLIC PANEL - Abnormal; Notable for the following:    Glucose, Bld 120 (*)    Total Bilirubin <0.1 (*)    All other components within normal limits  LIPASE, BLOOD - Abnormal; Notable for the following:    Lipase 20 (*)    All other components within normal limits  URINALYSIS COMPLETEWITH MICROSCOPIC ([facility] ONLY) - Abnormal; Notable for the following:    Color, Urine AMBER (*)    APPearance HAZY (*)    Ketones, ur 1+ (*)    Bacteria, UA RARE (*)    Squamous Epithelial / LPF 0-5 (*)    All other components within normal limits  GLUCOSE, CAPILLARY - Abnormal; Notable for the following:    Glucose-Capillary 120 (*)    All other components within normal limits  PREGNANCY,  URINE - Abnormal; Notable for the following:    Preg Test, Ur NEGATIVE (*)    All other components within normal limits  TROPONIN I   ____________________________________________  EKG  ED ECG REPORT I, Schaevitz,  David M, the attending physician, personally viewed and interpreted this ECG.   Date: 09/30/2014  EKG Time: 0301  Rate: 74  Rhythm:   normal sinus rhythm  Axis: Normal axis  Intervals:none  ST&T Change: There are no ST elevation or depressions. No abnormal T-wave inversions.  ____________________________________________  RADIOLOGY  Chest x-ray and right upper quadrant ultrasound NAD. ____________________________________________   PROCEDURES   ____________________________________________   INITIAL IMPRESSION / ASSESSMENT AND PLAN / ED COURSE  Pertinent labs & imaging results that were available during my care of the patient were reviewed by me and considered in my medical decision making (see chart for details).  ----------------------------------------- 5:27 AM on 09/30/2014 -----------------------------------------  Patient reassessed. Resting comfortably but now with right lower quadrant tenderness to palpation. We'll scan. Does not want any further pain meds at this time.  ----------------------------------------- 7:16 AM on 09/30/2014 -----------------------------------------  Patient awaiting CAT scan of the abdomen and pelvis. Dr. Gayle to follow up with imaging and disposition accordingly. ____________________________________________   FINAL CLINICAL IMPRESSION(S) / ED DIAGNOSES  Abdominal pain. Nausea vomiting. Acute, initial visit.    David Matthew Schaevitz, MD 09/30/14 0721 

## 2014-09-30 NOTE — ED Provider Notes (Signed)
-----------------------------------------   7:52 AM on 09/30/2014 -----------------------------------------  Care was assumed from Dr. Dineen Kid at 0700 pending results of CT of the abdomen and pelvis. CT shows normal appendix and no etiology to explain the patient's right lower quadrant abdominal pain. The patient remains hemodynamically stable. She is hungry and tolerating food here in the emergency department. We discussed return precautions and close PCP follow-up and she is comfortable with discharge.  Joanne Gavel, MD 09/30/14 [phone]

## 2014-10-06 ENCOUNTER — Telehealth: Payer: Self-pay | Admitting: *Deleted

## 2014-10-06 ENCOUNTER — Encounter: Payer: Self-pay | Admitting: *Deleted

## 2014-10-06 DIAGNOSIS — I10 Essential (primary) hypertension: Secondary | ICD-10-CM | POA: Diagnosis not present

## 2014-10-06 DIAGNOSIS — Z79899 Other long term (current) drug therapy: Secondary | ICD-10-CM | POA: Diagnosis not present

## 2014-10-06 DIAGNOSIS — E119 Type 2 diabetes mellitus without complications: Secondary | ICD-10-CM | POA: Insufficient documentation

## 2014-10-06 DIAGNOSIS — Z87891 Personal history of nicotine dependence: Secondary | ICD-10-CM | POA: Diagnosis not present

## 2014-10-06 DIAGNOSIS — K219 Gastro-esophageal reflux disease without esophagitis: Secondary | ICD-10-CM | POA: Insufficient documentation

## 2014-10-06 DIAGNOSIS — R1084 Generalized abdominal pain: Secondary | ICD-10-CM | POA: Diagnosis present

## 2014-10-06 LAB — URINALYSIS COMPLETE WITH MICROSCOPIC (ARMC ONLY)
BILIRUBIN URINE: NEGATIVE
Glucose, UA: NEGATIVE mg/dL
Hgb urine dipstick: NEGATIVE
Ketones, ur: NEGATIVE mg/dL
Leukocytes, UA: NEGATIVE
Nitrite: NEGATIVE
PROTEIN: NEGATIVE mg/dL
SPECIFIC GRAVITY, URINE: 1.016 (ref 1.005–1.030)
pH: 5 (ref 5.0–8.0)

## 2014-10-06 LAB — CBC WITH DIFFERENTIAL/PLATELET
BASOS PCT: 1 %
Basophils Absolute: 0.1 10*3/uL (ref 0–0.1)
EOS PCT: 5 %
Eosinophils Absolute: 0.5 10*3/uL (ref 0–0.7)
HEMATOCRIT: 37.7 % (ref 35.0–47.0)
Hemoglobin: 12.4 g/dL (ref 12.0–16.0)
Lymphocytes Relative: 26 %
Lymphs Abs: 2.6 10*3/uL (ref 1.0–3.6)
MCH: 29.4 pg (ref 26.0–34.0)
MCHC: 33 g/dL (ref 32.0–36.0)
MCV: 89.1 fL (ref 80.0–100.0)
Monocytes Absolute: 0.5 10*3/uL (ref 0.2–0.9)
Monocytes Relative: 5 %
NEUTROS PCT: 63 %
Neutro Abs: 6.2 10*3/uL (ref 1.4–6.5)
PLATELETS: 288 10*3/uL (ref 150–440)
RBC: 4.23 MIL/uL (ref 3.80–5.20)
RDW: 14.1 % (ref 11.5–14.5)
WBC: 9.9 10*3/uL (ref 3.6–11.0)

## 2014-10-06 NOTE — Telephone Encounter (Signed)
She was asking for nausea med, I advised her to contact her PMD office to get med until they can see her on Thursday. She said she would do that

## 2014-10-06 NOTE — ED Notes (Signed)
Pt c/o abdominal x 1 week, unable to followup with PCP. Pt unable to see PCP this week. Pt c/o continued abdominal pain and nausea, denies vomiting. Pt states decreased urination and PO intake.

## 2014-10-07 ENCOUNTER — Emergency Department
Admission: EM | Admit: 2014-10-07 | Discharge: 2014-10-07 | Disposition: A | Discharge status: Home / Self Care | Payer: Medicare Other | Attending: Emergency Medicine | Admitting: Emergency Medicine

## 2014-10-07 DIAGNOSIS — R1084 Generalized abdominal pain: Secondary | ICD-10-CM

## 2014-10-07 DIAGNOSIS — K219 Gastro-esophageal reflux disease without esophagitis: Secondary | ICD-10-CM

## 2014-10-07 LAB — COMPREHENSIVE METABOLIC PANEL
ALK PHOS: 51 U/L (ref 38–126)
ALT: 15 U/L (ref 14–54)
AST: 19 U/L (ref 15–41)
Albumin: 3.6 g/dL (ref 3.5–5.0)
Anion gap: 12 (ref 5–15)
BILIRUBIN TOTAL: 0.1 mg/dL — AB (ref 0.3–1.2)
BUN: 14 mg/dL (ref 6–20)
CHLORIDE: 100 mmol/L — AB (ref 101–111)
CO2: 29 mmol/L (ref 22–32)
Calcium: 9.2 mg/dL (ref 8.9–10.3)
Creatinine, Ser: 0.86 mg/dL (ref 0.44–1.00)
GFR calc Af Amer: >60 mL/min (ref >60)
GFR calc non Af Amer: >60 mL/min (ref >60)
GLUCOSE: 138 mg/dL — AB (ref 65–99)
Potassium: 3.5 mmol/L (ref 3.5–5.1)
Sodium: 141 mmol/L (ref 135–145)
TOTAL PROTEIN: 7.3 g/dL (ref 6.5–8.1)

## 2014-10-07 LAB — TROPONIN I

## 2014-10-07 LAB — LIPASE, BLOOD: Lipase: 22 U/L (ref 22–51)

## 2014-10-07 MED ORDER — ONDANSETRON HCL 40 MG/20ML IJ SOLN
8.0000 mg | Freq: Once | INTRAMUSCULAR | Status: DC
Start: 2014-10-07 — End: 2014-10-07
  Filled 2014-10-07: qty 4

## 2014-10-07 MED ORDER — ONDANSETRON HCL 4 MG/2ML IJ SOLN
INTRAMUSCULAR | Status: AC
  Administered 2014-10-07: 8 mg
  Filled 2014-10-07: qty 4

## 2014-10-07 MED ORDER — FAMOTIDINE IN NACL 20-0.9 MG/50ML-% IV SOLN
INTRAVENOUS | Status: AC
  Administered 2014-10-07: 20 mg via INTRAVENOUS
  Filled 2014-10-07: qty 50

## 2014-10-07 MED ORDER — SODIUM CHLORIDE 0.9 % IV SOLN: 8.0000 mg | Freq: Once | INTRAVENOUS | Status: DC

## 2014-10-07 MED ORDER — GI COCKTAIL ~~LOC~~
ORAL | Status: AC
  Administered 2014-10-07: 30 mL via ORAL
  Filled 2014-10-07: qty 30

## 2014-10-07 MED ORDER — GI COCKTAIL ~~LOC~~
30.0000 mL | ORAL | Status: AC
  Administered 2014-10-07: 30 mL via ORAL

## 2014-10-07 MED ORDER — SUCRALFATE 1 G PO TABS
1.0000 g | ORAL_TABLET | Freq: Four times a day (QID) | ORAL | Status: DC
Start: 2014-10-07 — End: 2015-06-04

## 2014-10-07 MED ORDER — FAMOTIDINE IN NACL 20-0.9 MG/50ML-% IV SOLN
20.0000 mg | Freq: Once | INTRAVENOUS | Status: AC
  Administered 2014-10-07: 20 mg via INTRAVENOUS

## 2014-10-07 MED ORDER — RANITIDINE HCL 150 MG PO CAPS: 150.0000 mg | ORAL_CAPSULE | Freq: Two times a day (BID) | ORAL | Status: DC

## 2014-10-07 NOTE — ED Provider Notes (Signed)
[facility] Emergency Department Provider Note  ____________________________________________  Time seen: 2:30 AM  I have reviewed the triage vital signs and the nursing notes.   HISTORY  Chief Complaint Abdominal Pain    HPI Sherry Price is a 44 y.o. female who complains of generalized abdominal pain for the past week. She notes that she's been taking tamoxifen for 3 years for breast cancer and feels that this may be causing GI upset. She was seen in the ED 5 days ago, and had negative workup including ultrasound and CT scan of the abdomen pelvis. No fevers or chills. She is having nausea without vomiting or diarrhea. No syncope. She does note that she has sore throat and occasional dry cough, and when she lies flat at night in bed, she has a feeling of regurgitation and choking. She does note a prior history of GERD but has not taken anything in several years for it. She used to use Nexium. She does note she has temporary relief when she takes Tums at home.       Past Medical History  Diagnosis Date  . Diabetes mellitus without complication   . Arthritis   . Hypertension   . 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  . Sleep apnea   . Panic attacks     Patient Active Problem List   Diagnosis Date Noted  . Malignant neoplasm of 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 05/21/2011    Past Surgical History  Procedure Laterality Date  . Breast lumpectomy  2013  . Breast reduction surgery  2005  . Oophorectomy Right 2013  . Breast mammosite Left February 2013    Current Outpatient Rx  Name  Route  Sig  Dispense  Refill  . ALPRAZolam (XANAX) 1 MG tablet   Oral   Take 1 mg by mouth 2 (two) times daily.          . Furosemide (LASIX PO)   Oral   Take 1 tablet by mouth daily.         . hydrochlorothiazide  (HYDRODIURIL) 25 MG tablet   Oral   Take 25 mg by mouth daily.          . ibuprofen (ADVIL,MOTRIN) 200 MG tablet   Oral   Take 200 mg by mouth.         . losartan (COZAAR) 25 MG tablet   Oral   Take 25 mg by mouth daily.         . metFORMIN (GLUCOPHAGE) 500 MG tablet   Oral   Take 500 mg by mouth daily with breakfast.          . POTASSIUM PO   Oral   Take 1 tablet by mouth 2 (two) times daily.         . ranitidine (ZANTAC) 150 MG capsule   Oral   Take 1 capsule (150 mg total) by mouth 2 (two) times daily.   28 capsule   0   . sucralfate (CARAFATE) 1 G tablet   Oral   Take 1 tablet (1 g total) by mouth 4 (four) times daily.   120 tablet   1   . tamoxifen (NOLVADEX) 20 MG tablet   Oral   Take 20 mg by mouth daily.          . venlafaxine XR (EFFEXOR-XR) 150   MG 24 hr capsule   Oral   Take 150 mg by mouth daily with breakfast.            Allergies Review of patient's allergies indicates no known allergies.  Family History  Problem Relation Age of Onset  . Hypertension Mother   . Heart disease Mother   . Diabetes Mother     Social History History  Substance Use Topics  . Smoking status: Former Smoker -- 26 years  . Smokeless tobacco: Not on file  . Alcohol Use: Yes     Comment: occasionally    Review of Systems  Constitutional: No fever or chills. No weight changes Eyes:No blurry vision or double vision.  ENT: No sore throat. Cardiovascular: No chest pain. Respiratory: No dyspnea or cough. GastrointestinalGeneralized abdominal l pain, vomiting and diarrhea.  No BRBPR or melena. Genitourinary: Negative for dysuria, urinary retention, bloody urine, or difficulty urinating. Musculoskeletal: Negative for back pain. No joint swelling or pain. Skin: Negative for rash. Neurological: Negative for headaches, focal weakness or numbness. Psychiatric:No anxiety or depression.   Endocrine:No hot/cold intolerance, changes in energy, or sleep  difficulty.  10-point ROS otherwise negative.  ____________________________________________   PHYSICAL EXAM:  VITAL SIGNS: ED Triage Vitals  Enc Vitals Group     BP 10/06/14 2306 143/84 mmHg     Pulse Rate 10/06/14 2306 82     Resp 10/06/14 2306 16     Temp 10/06/14 2306 97.5 F (36.4 C)     Temp Source 10/06/14 2306 Oral     SpO2 10/06/14 2306 100 %     Weight 10/06/14 2306 322 lb (146.058 kg)     Height 10/06/14 2306 5' 7" (1.702 m)     Head Cir --      Peak Flow --      Pain Score 10/06/14 2307 6     Pain Loc --      Pain Edu? --      Excl. in [place]? --      Constitutional: Alert and oriented. Well appearing and in no distress. Eyes: No scleral icterus. No conjunctival pallor. PERRL. EOMI ENT   Head: Normocephalic and atraumatic.   Nose: No congestion/rhinnorhea. No septal hematoma   Mouth/Throat: MMM,  mild pharyngeal erythema. No peritonsillar mass. No uvula shift.   Neck: No stridor. No SubQ emphysema. No meningismus. Hematological/Lymphatic/Immunilogical: No cervical lymphadenopathy. Cardiovascular: RRR. Normal and symmetric distal pulses are present in all extremities. No murmurs, rubs, or gallops. Respiratory: Normal respiratory effort without tachypnea nor retractions. Breath sounds are clear and equal bilaterally. No wheezes/rales/rhonchi. Gastrointestinal: Soft and nontender. No distention. There is no CVA tenderness.  No rebound, rigidity, or guarding. Genitourinary: deferred Musculoskeletal: Nontender with normal range of motion in all extremities. No joint effusions.  No lower extremity tenderness.  No edema. Neurologic:   Normal speech and language.  CN 2-10 normal. Motor grossly intact. No pronator drift.  Normal gait. No gross focal neurologic deficits are appreciated.  Skin:  Skin is warm, dry and intact. No rash noted.  No petechiae, purpura, or bullae. Psychiatric: Mood and affect are normal. Speech and behavior are normal. Patient  exhibits appropriate insight and judgment.  ____________________________________________    LABS (pertinent positives/negatives) (all labs ordered are listed, but only abnormal results are displayed) Labs Reviewed  COMPREHENSIVE METABOLIC PANEL - Abnormal; Notable for the following:    Chloride 100 (*)    Glucose, Bld 138 (*)    Total Bilirubin 0.1 (*)  All other components within normal limits  URINALYSIS COMPLETEWITH MICROSCOPIC ([facility] ONLY) - Abnormal; Notable for the following:    Color, Urine YELLOW (*)    APPearance CLEAR (*)    Bacteria, UA RARE (*)    Squamous Epithelial / LPF 0-5 (*)    All other components within normal limits  CBC WITH DIFFERENTIAL/PLATELET  LIPASE, BLOOD  TROPONIN I   ____________________________________________   EKG    ____________________________________________    RADIOLOGY    ____________________________________________   PROCEDURES  ____________________________________________   INITIAL IMPRESSION / ASSESSMENT AND PLAN / ED COURSE  Pertinent labs & imaging results that were available during my care of the patient were reviewed by me and considered in my medical decision making (see chart for details).  Presentation consistent with GERD and gastritis. Given her recent extensive workup which was unremarkable and her current benign abdominal exam, I don't think that any additional imaging will be beneficial at this time. We'll give her GI cocktail and IV Pepcid as well as Zofran, and then plan to discharge her home on a continued course of Carafate and ranitidine. She'll follow up with primary care for continued monitoring of her symptoms. Nontoxic well-appearing, no evidence of cholangitis cholecystitis appendicitis obstruction abscess perforation or other surgical pathology. Low suspicion for ACS TAD AAA pneumothorax carditis mediastinitis PE.   ____________________________________________   FINAL CLINICAL IMPRESSION(S) / ED  DIAGNOSES  Final diagnoses:  Gastroesophageal reflux disease without esophagitis  Generalized abdominal pain      Phillip Stafford, MD 10/07/14 0352 

## 2014-10-07 NOTE — ED Notes (Signed)
Pt PO intake 8 oz tolerated well, pt with dry flatulus

## 2014-10-07 NOTE — Discharge Instructions (Signed)
Abdominal Pain Many things can cause abdominal pain. Usually, abdominal pain is not caused by a disease and will improve without treatment. It can often be observed and treated at home. Your health care provider will do a physical exam and possibly order blood tests and X-rays to help determine the seriousness of your pain. However, in many cases, more time must pass before a clear cause of the pain can be found. Before that point, your health care provider may not know if you need more testing or further treatment. HOME CARE INSTRUCTIONS  Monitor your abdominal pain for any changes. The following actions may help to alleviate any discomfort you are experiencing:  Only take over-the-counter or prescription medicines as directed by your health care provider.  Do not take laxatives unless directed to do so by your health care provider.  Try a clear liquid diet (broth, tea, or water) as directed by your health care provider. Slowly move to a bland diet as tolerated. SEEK MEDICAL CARE IF:  You have unexplained abdominal pain.  You have abdominal pain associated with nausea or diarrhea.  You have pain when you urinate or have a bowel movement.  You experience abdominal pain that wakes you in the night.  You have abdominal pain that is worsened or improved by eating food.  You have abdominal pain that is worsened with eating fatty foods.  You have a fever. SEEK IMMEDIATE MEDICAL CARE IF:   Your pain does not go away within 2 hours.  You keep throwing up (vomiting).  Your pain is felt only in portions of the abdomen, such as the right side or the left lower portion of the abdomen.  You pass bloody or black tarry stools. MAKE SURE YOU:  Understand these instructions.   Will watch your condition.   Will get help right away if you are not doing well or get worse.  Document Released: 01/29/2005 Document Revised: 04/26/2013 Document Reviewed: 12/29/2012 [facility] Patient Information  2015 [place], [place]. This information is not intended to replace advice given to you by your health care provider. Make sure you discuss any questions you have with your health care provider.  Gastroesophageal Reflux Disease, Adult Gastroesophageal reflux disease (GERD) happens when acid from your stomach flows up into the esophagus. When acid comes in contact with the esophagus, the acid causes soreness (inflammation) in the esophagus. Over time, GERD may create small holes (ulcers) in the lining of the esophagus. CAUSES   Increased body weight. This puts pressure on the stomach, making acid rise from the stomach into the esophagus.  Smoking. This increases acid production in the stomach.  Drinking alcohol. This causes decreased pressure in the lower esophageal sphincter (valve or ring of muscle between the esophagus and stomach), allowing acid from the stomach into the esophagus.  Late evening meals and a full stomach. This increases pressure and acid production in the stomach.  A malformed lower esophageal sphincter. Sometimes, no cause is found. SYMPTOMS   Burning pain in the lower part of the mid-chest behind the breastbone and in the mid-stomach area. This may occur twice a week or more often.  Trouble swallowing.  Sore throat.  Dry cough.  Asthma-like symptoms including chest tightness, shortness of breath, or wheezing. DIAGNOSIS  Your caregiver may be able to diagnose GERD based on your symptoms. In some cases, X-rays and other tests may be done to check for complications or to check the condition of your stomach and esophagus. TREATMENT  Your caregiver may  recommend over-the-counter or prescription medicines to help decrease acid production. Ask your caregiver before starting or adding any new medicines.  HOME CARE INSTRUCTIONS   Change the factors that you can control. Ask your caregiver for guidance concerning weight loss, quitting smoking, and alcohol  consumption.  Avoid foods and drinks that make your symptoms worse, such as:  Caffeine or alcoholic drinks.  Chocolate.  Peppermint or mint flavorings.  Garlic and onions.  Spicy foods.  Citrus fruits, such as oranges, lemons, or limes.  Tomato-based foods such as sauce, chili, salsa, and pizza.  Fried and fatty foods.  Avoid lying down for the 3 hours prior to your bedtime or prior to taking a nap.  Eat small, frequent meals instead of large meals.  Wear loose-fitting clothing. Do not wear anything tight around your waist that causes pressure on your stomach.  Raise the head of your bed 6 to 8 inches with wood blocks to help you sleep. Extra pillows will not help.  Only take over-the-counter or prescription medicines for pain, discomfort, or fever as directed by your caregiver.  Do not take aspirin, ibuprofen, or other nonsteroidal anti-inflammatory drugs (NSAIDs). SEEK IMMEDIATE MEDICAL CARE IF:   You have pain in your arms, neck, jaw, teeth, or back.  Your pain increases or changes in intensity or duration.  You develop nausea, vomiting, or sweating (diaphoresis).  You develop shortness of breath, or you faint.  Your vomit is green, yellow, black, or looks like coffee grounds or blood.  Your stool is red, bloody, or black. These symptoms could be signs of other problems, such as heart disease, gastric bleeding, or esophageal bleeding. MAKE SURE YOU:   Understand these instructions.  Will watch your condition.  Will get help right away if you are not doing well or get worse. Document Released: 01/29/2005 Document Revised: 07/14/2011 Document Reviewed: 11/08/2010 [facility] Patient Information 2015 [place], [place]. This information is not intended to replace advice given to you by your health care provider. Make sure you discuss any questions you have with your health care provider.

## 2014-10-10 ENCOUNTER — Telehealth: Payer: Self-pay | Admitting: *Deleted

## 2014-10-10 NOTE — Telephone Encounter (Signed)
After discussing with Dr Oliva Bustard, I called pt back and instructed her to stop her Tamoxifen and see how she does, that Dr Oliva Bustard can see her the end of June if she is still having problems. She stated that her PMD is working her in today, but to go ahead and schedule her an appt for the end of June. She stated she will stop the Tamoxifen for now

## 2014-11-02 ENCOUNTER — Inpatient Hospital Stay: Payer: Medicare Other | Attending: Oncology | Admitting: Oncology

## 2014-11-02 VITALS — BP 141/98 | HR 90 | Temp 97.1°F | Wt 310.2 lb

## 2014-11-02 DIAGNOSIS — C50419 Malignant neoplasm of upper-outer quadrant of unspecified female breast: Secondary | ICD-10-CM

## 2014-11-02 DIAGNOSIS — E119 Type 2 diabetes mellitus without complications: Secondary | ICD-10-CM | POA: Diagnosis not present

## 2014-11-02 DIAGNOSIS — C50412 Malignant neoplasm of upper-outer quadrant of left female breast: Secondary | ICD-10-CM | POA: Diagnosis not present

## 2014-11-02 DIAGNOSIS — K5900 Constipation, unspecified: Secondary | ICD-10-CM | POA: Insufficient documentation

## 2014-11-02 DIAGNOSIS — R1031 Right lower quadrant pain: Secondary | ICD-10-CM | POA: Diagnosis not present

## 2014-11-02 DIAGNOSIS — F329 Major depressive disorder, single episode, unspecified: Secondary | ICD-10-CM | POA: Diagnosis not present

## 2014-11-02 DIAGNOSIS — G4730 Sleep apnea, unspecified: Secondary | ICD-10-CM | POA: Insufficient documentation

## 2014-11-02 DIAGNOSIS — I10 Essential (primary) hypertension: Secondary | ICD-10-CM | POA: Diagnosis not present

## 2014-11-02 DIAGNOSIS — Z9221 Personal history of antineoplastic chemotherapy: Secondary | ICD-10-CM | POA: Insufficient documentation

## 2014-11-02 DIAGNOSIS — M129 Arthropathy, unspecified: Secondary | ICD-10-CM | POA: Diagnosis not present

## 2014-11-02 DIAGNOSIS — Z79899 Other long term (current) drug therapy: Secondary | ICD-10-CM | POA: Diagnosis not present

## 2014-11-02 DIAGNOSIS — Z170 Estrogen receptor positive status [ER+]: Secondary | ICD-10-CM | POA: Diagnosis not present

## 2014-11-02 DIAGNOSIS — Z87891 Personal history of nicotine dependence: Secondary | ICD-10-CM | POA: Insufficient documentation

## 2014-11-02 DIAGNOSIS — Z79811 Long term (current) use of aromatase inhibitors: Secondary | ICD-10-CM | POA: Insufficient documentation

## 2014-11-02 DIAGNOSIS — R599 Enlarged lymph nodes, unspecified: Secondary | ICD-10-CM | POA: Insufficient documentation

## 2014-11-02 DIAGNOSIS — Z923 Personal history of irradiation: Secondary | ICD-10-CM | POA: Diagnosis not present

## 2014-11-02 NOTE — Progress Notes (Signed)
Patient does not have living will.  Former smoker. Patient c/o having problems with moving her bowels.  Has had numerous trips to ED and PCP.  Things are better but not resolved.

## 2014-11-05 ENCOUNTER — Encounter: Payer: Self-pay | Admitting: Oncology

## 2014-11-05 NOTE — Progress Notes (Signed)
[place] @ [facility] Telephone:([phone]  Fax:([phone]) [place]: 1971-01-14  MR#: [id number]  YBO#:[id number]  Patient Care Team: Marguerita Merles, MD as PCP - General (Family Medicine) Forest Gleason, MD (Unknown Physician Specialty) Robert Bellow, MD (General Surgery)  CHIEF COMPLAINT:  Chief Complaint  Patient presents with  . Follow-up    Oncology History   Chief Complaint/Diagnosis:   44 year old female with 2 adjacent separate primaries of the left breast status post wide local excision the largest being a T1 C. N0 M0 lesion the other a T1 B. N0 lesion (both lesions are only  a few millimeter apart,And according to pathologist tumors are similar)Most likely we are dealing with T2, N0, M0 tumor  Estrogen receptor positive (weak staining) progesterone receptor positive (weak staining) and HER-2 receptor negative by FISH.  Status post lumpectomy on May 21, 2011 patient is on NSABP protocol B. 44 2. finished 6 cycles of chemotherapy 3. Currently on Tamoxifen     Malignant neoplasm of 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   05/21/2011 Initial Diagnosis Malignant neoplasm of 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    Oncology Flowsheet 09/30/2014 09/30/2014 10/07/2014  ondansetron (ZOFRAN) IJ - - -  ondansetron (ZOFRAN) IV 4 mg 4 mg -    INTERVAL HISTORY: 44 year old lady with a history of stage II carcinoma breast status post lumpectomy radiation therapy and chemotherapy.  According to patient's and was doing very well except 2 weeks ago started having right lower quadrant pain.  Patient presented to emergency room she was found to have constipation.  The scan and ultrasound was done.  CT scan revealed borderline enlarged pelvic lymph nodes.  Ultrasound was reported to be negative.  Patient continues to have some discomfort.  Constipation is a major problem.  Patient is somewhat depressed Also getting regular mammogram for carcinoma breast taking  tamoxifen   REVIEW OF SYSTEMS:   Gen. status: Somewhat depressed lady not any acute distress.  HEENT: No difficulty swallowing.  GI: Right lower quadrant discomfort.  Constipation.  GU: No dysuria hematuria.  Lower extremity no swelling.  Skin: No rash.  Lungs: No shortness of breath patient does have sleep apnea syndrome cardiac: No chest pain or paroxysmal nocturnal dyspnea Neurological system no headache no dizziness Past skeletal system no bony pain As per HPI. Otherwise, a complete review of systems is negatve.  PAST MEDICAL HISTORY: Past Medical History  Diagnosis Date  . Diabetes mellitus without complication   . Arthritis   . Hypertension   . 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  . Sleep apnea   . Panic attacks     PAST SURGICAL HISTORY: Past Surgical History  Procedure Laterality Date  . Breast lumpectomy  2013  . Breast reduction surgery  2005  . Oophorectomy Right 2013  . Breast mammosite Left February 2013    FAMILY HISTORY Family History  Problem Relation Age of Onset  . Hypertension Mother   . Heart disease Mother   . Diabetes Mother     ADVANCED DIRECTIVES:  Does not have advance healthcare directive.  Information was given HEALTH MAINTENANCE: History  Substance Use Topics  . Smoking status: Former Smoker -- 26 years  . Smokeless tobacco: Not on file  . Alcohol Use: Yes     Comment: occasionally      No  Known Allergies  Current Outpatient Prescriptions  Medication Sig Dispense Refill  . ALPRAZolam (XANAX) 1 MG tablet Take 1 mg by mouth 2 (two) times daily.     . Furosemide (LASIX PO) Take 1 tablet by mouth daily.    Marland Kitchen ibuprofen (ADVIL,MOTRIN) 200 MG tablet Take 200 mg by mouth.    . losartan (COZAAR) 25 MG tablet Take 25 mg by mouth daily.    . metFORMIN (GLUCOPHAGE) 500 MG tablet Take 500 mg by mouth  daily with breakfast.     . POTASSIUM PO Take 1 tablet by mouth 2 (two) times daily.    . ranitidine (ZANTAC) 150 MG capsule Take 1 capsule (150 mg total) by mouth 2 (two) times daily. 28 capsule 0  . sucralfate (CARAFATE) 1 G tablet Take 1 tablet (1 g total) by mouth 4 (four) times daily. 120 tablet 1  . tamoxifen (NOLVADEX) 20 MG tablet Take 20 mg by mouth daily.     Marland Kitchen venlafaxine XR (EFFEXOR-XR) 150 MG 24 hr capsule Take 150 mg by mouth daily with breakfast.     . hydrochlorothiazide (HYDRODIURIL) 25 MG tablet Take 25 mg by mouth daily.      No current facility-administered medications for this visit.    OBJECTIVE:  Filed Vitals:   11/02/14 1355  BP: 141/98  Pulse: 90  Temp: 97.1 F (36.2 C)     Body mass index is 48.57 kg/(m^2).    ECOG FS:1 - Symptomatic but completely ambulatory  PHYSICAL EXAM: GENERAL:  Well developed, well nourished, sitting comfortably in the exam room in no acute distress. MENTAL STATUS:  Alert and oriented to person, place and time.  She is depressed HEAD:  Normocephalic, atraumatic, face symmetric, no Cushingoid features. EYES:   Pupils equal round and reactive to light and accomodation.  No conjunctivitis or scleral icterus. ENT:  Oropharynx clear without lesion.  Tongue normal. Mucous membranes moist.  RESPIRATORY:  Clear to auscultation without rales, wheezes or rhonchi. CARDIOVASCULAR:  Regular rate and rhythm without murmur, rub or gallop. BREAST:  Right breast without masses, skin changes or nipple discharge.  Left breast without masses, skin changes or nipple discharge. ABDOMEN:  Soft, non-tender, with active bowel sounds, and no hepatosplenomegaly.  No masses. BACK:  No CVA tenderness.  No tenderness on percussion of the back or rib cage. SKIN:  No rashes, ulcers or lesions. EXTREMITIES: No edema, no skin discoloration or tenderness.  No palpable cords. LYMPH NODES: No palpable cervical, supraclavicular, axillary or inguinal adenopathy    NEUROLOGICAL: Unremarkable. PSYCH:  Appropriate.   LAB RESULTS:  No visits with results within 2 Day(s) from this visit. Latest known visit with results is:  Admission on 10/07/2014, Discharged on 10/07/2014  Component Date Value Ref Range Status  . WBC 10/06/2014 9.9  3.6 - 11.0 K/uL Final  . RBC 10/06/2014 4.23  3.80 - 5.20 MIL/uL Final  . Hemoglobin 10/06/2014 12.4  12.0 - 16.0 g/dL Final  . HCT 10/06/2014 37.7  35.0 - 47.0 % Final  . MCV 10/06/2014 89.1  80.0 - 100.0 fL Final  . MCH 10/06/2014 29.4  26.0 - 34.0 pg Final  . MCHC 10/06/2014 33.0  32.0 - 36.0 g/dL Final  . RDW 10/06/2014 14.1  11.5 - 14.5 % Final  . Platelets 10/06/2014 288  150 - 440 K/uL Final  . Neutrophils Relative % 10/06/2014 63   Final  . Neutro Abs 10/06/2014 6.2  1.4 - 6.5 K/uL Final  . Lymphocytes Relative 10/06/2014 26  Final  . Lymphs Abs 10/06/2014 2.6  1.0 - 3.6 K/uL Final  . Monocytes Relative 10/06/2014 5   Final  . Monocytes Absolute 10/06/2014 0.5  0.2 - 0.9 K/uL Final  . Eosinophils Relative 10/06/2014 5   Final  . Eosinophils Absolute 10/06/2014 0.5  0 - 0.7 K/uL Final  . Basophils Relative 10/06/2014 1   Final  . Basophils Absolute 10/06/2014 0.1  0 - 0.1 K/uL Final  . Sodium 10/06/2014 141  135 - 145 mmol/L Final  . Potassium 10/06/2014 3.5  3.5 - 5.1 mmol/L Final  . Chloride 10/06/2014 100* 101 - 111 mmol/L Final  . CO2 10/06/2014 29  22 - 32 mmol/L Final  . Glucose, Bld 10/06/2014 138* 65 - 99 mg/dL Final  . BUN 10/06/2014 14  6 - 20 mg/dL Final  . Creatinine, Ser 10/06/2014 0.86  0.44 - 1.00 mg/dL Final  . Calcium 10/06/2014 9.2  8.9 - 10.3 mg/dL Final  . Total Protein 10/06/2014 7.3  6.5 - 8.1 g/dL Final  . Albumin 10/06/2014 3.6  3.5 - 5.0 g/dL Final  . AST 10/06/2014 19  15 - 41 U/L Final  . ALT 10/06/2014 15  14 - 54 U/L Final  . Alkaline Phosphatase 10/06/2014 51  38 - 126 U/L Final  . Total Bilirubin 10/06/2014 0.1* 0.3 - 1.2 mg/dL Final  . GFR calc non Af Amer 10/06/2014  >60  >60 mL/min Final  . GFR calc Af Amer 10/06/2014 >60  >60 mL/min Final   Comment: (NOTE) The eGFR has been calculated using the CKD EPI equation. This calculation has not been validated in all clinical situations. eGFR's persistently <60 mL/min signify possible Chronic Kidney Disease.   . Anion gap 10/06/2014 12  5 - 15 Final  . Lipase 10/06/2014 22  22 - 51 U/L Final  . Troponin I 10/06/2014 <0.03  <0.031 ng/mL Final   Comment:        NO INDICATION OF MYOCARDIAL INJURY.   . Color, Urine 10/06/2014 YELLOW* YELLOW Final  . APPearance 10/06/2014 CLEAR* CLEAR Final  . Glucose, UA 10/06/2014 NEGATIVE  NEGATIVE mg/dL Final  . Bilirubin Urine 10/06/2014 NEGATIVE  NEGATIVE Final  . Ketones, ur 10/06/2014 NEGATIVE  NEGATIVE mg/dL Final  . Specific Gravity, Urine 10/06/2014 1.016  1.005 - 1.030 Final  . Hgb urine dipstick 10/06/2014 NEGATIVE  NEGATIVE Final  . pH 10/06/2014 5.0  5.0 - 8.0 Final  . Protein, ur 10/06/2014 NEGATIVE  NEGATIVE mg/dL Final  . Nitrite 10/06/2014 NEGATIVE  NEGATIVE Final  . Leukocytes, UA 10/06/2014 NEGATIVE  NEGATIVE Final  . RBC / HPF 10/06/2014 0-5  0 - 5 RBC/hpf Final  . WBC, UA 10/06/2014 0-5  0 - 5 WBC/hpf Final  . Bacteria, UA 10/06/2014 RARE* NONE SEEN Final  . Squamous Epithelial / LPF 10/06/2014 0-5* NONE SEEN Final  . Mucous 10/06/2014 PRESENT   Final  . Hyaline Casts, UA 10/06/2014 PRESENT   Final       STUDIES Patient recently had ultrasound of abdomen in May of 2016 which has been reviewed no abnormality detected patient also had a CT scan of abdomen and pelvis done in the emergency room for evaluation regarding right lower quadrant pain and enlarged lymph node borderline enlargement was found in the right pelvic area consider inflammatory but further follow-up pending needed ASSESSMENT:MWDICAL DECISION MAKING CT scan ultrasound has been reviewed. Repeat ultrasound or CT scan of abdomen for follow-up for those borderline abnormal lymph  node_0 Repeat CT scan  has been ordered in 3 months Protocol regarding constipation was given She was advised to get mammogram done as well as colonoscopy area and appointment has been made to see gastroenterologist  :    Patient expressed understanding and was in agreement with this plan. She also understands that She can call clinic at any time with any questions, concerns, or complaints.    No matching staging information was found for the patient.  Forest Gleason, MD   11/05/2014 3:15 PM

## 2014-11-14 ENCOUNTER — Ambulatory Visit: Payer: Self-pay | Admitting: Gastroenterology

## 2014-12-07 ENCOUNTER — Ambulatory Visit (INDEPENDENT_AMBULATORY_CARE_PROVIDER_SITE_OTHER): Payer: Medicare Other | Admitting: Gastroenterology

## 2014-12-07 ENCOUNTER — Encounter: Payer: Self-pay | Admitting: Gastroenterology

## 2014-12-07 ENCOUNTER — Other Ambulatory Visit: Payer: Self-pay

## 2014-12-07 ENCOUNTER — Encounter (INDEPENDENT_AMBULATORY_CARE_PROVIDER_SITE_OTHER): Payer: Self-pay

## 2014-12-07 VITALS — BP 127/85 | HR 87 | Ht 67.0 in | Wt 310.0 lb

## 2014-12-07 DIAGNOSIS — R140 Abdominal distension (gaseous): Secondary | ICD-10-CM

## 2014-12-07 DIAGNOSIS — K5900 Constipation, unspecified: Secondary | ICD-10-CM | POA: Diagnosis not present

## 2014-12-07 DIAGNOSIS — R110 Nausea: Secondary | ICD-10-CM

## 2014-12-07 DIAGNOSIS — K5909 Other constipation: Secondary | ICD-10-CM

## 2014-12-07 MED ORDER — ONDANSETRON 4 MG PO TBDP: 4.0000 mg | ORAL_TABLET | Freq: Three times a day (TID) | ORAL | Status: DC | PRN

## 2014-12-07 NOTE — Progress Notes (Signed)
Gastroenterology Consultation  Referring Provider:     Leanna Sato, MD Primary Care Physician:  Leanna Sato, MD Primary Gastroenterologist:  Dr. Servando Snare     Reason for Consultation:     Constipation and bloating and reflux with chest pain        HPI:   Sherry Price is a 44 y.o. y/o female referred for consultation & management of constipation and reflux by Dr. Leanna Sato, MD.  This patient comes today with a history of constipation and reflux with resulting chest pain. She also states that it causes her to have some nausea. The patient states this is been going on in June. There is no report of any unexplained weight loss although the patient states she has lost 70 pounds by trying prior to having this illness. The patient has never had a colonoscopy but does report that she believes her mother had polyps in her father had some sort of abdominal surgery with part of his colon removed but she is not sure for what reason. She denies any black stools or bloody stools. The patient states that her abdominal pain and bloating is diffuse throughout her entire abdomen. The patient happens to be morbidly obese  Past Medical History  Diagnosis Date  . Diabetes mellitus without complication   . Arthritis   . Hypertension   . 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  . Sleep apnea   . Panic attacks     Past Surgical History  Procedure Laterality Date  . Breast lumpectomy  2013  . Breast reduction surgery  2005  . Oophorectomy Right 2013  . Breast mammosite Left February 2013    Prior to Admission medications   Medication Sig Start Date End Date Taking? Authorizing Provider  ALPRAZolam Prudy Feeler) 1 MG tablet Take 1 mg by mouth 2 (two) times daily.  01/16/14  Yes Historical Provider, MD  Furosemide (LASIX PO) Take 1 tablet by mouth daily.   Yes  Historical Provider, MD  ibuprofen (ADVIL,MOTRIN) 200 MG tablet Take 200 mg by mouth.   Yes Historical Provider, MD  losartan (COZAAR) 25 MG tablet Take 25 mg by mouth daily.   Yes Historical Provider, MD  metFORMIN (GLUCOPHAGE) 500 MG tablet Take 500 mg by mouth daily with breakfast.  10/23/13  Yes Historical Provider, MD  POTASSIUM PO Take 1 tablet by mouth 2 (two) times daily.   Yes Historical Provider, MD  ranitidine (ZANTAC) 150 MG capsule Take 1 capsule (150 mg total) by mouth 2 (two) times daily. 10/07/14  Yes Sharman Cheek, MD  sucralfate (CARAFATE) 1 G tablet Take 1 tablet (1 g total) by mouth 4 (four) times daily. 10/07/14  Yes Sharman Cheek, MD  tamoxifen (NOLVADEX) 20 MG tablet Take 20 mg by mouth daily.  01/11/14  Yes Historical Provider, MD  venlafaxine XR (EFFEXOR-XR) 150 MG 24 hr capsule Take 150 mg by mouth daily with breakfast.  01/11/14  Yes Historical Provider, MD  DOCQLACE 100 MG capsule TK 1 C PO BID 11/03/14   Historical Provider, MD  hydrochlorothiazide (HYDRODIURIL) 25 MG tablet Take 25 mg by mouth daily.  01/22/14   Historical Provider, MD  lactulose (CHRONULAC) 10 GM/15ML solution  11/03/14   Historical Provider, MD    Family History  Problem Relation Age of Onset  . Hypertension Mother   . Heart disease  Mother   . Diabetes Mother      History  Substance Use Topics  . Smoking status: Former Smoker -- 26 years  . Smokeless tobacco: Never Used  . Alcohol Use: Yes     Comment: occasionally    Allergies as of 12/07/2014  . (No Known Allergies)    Review of Systems:    All systems reviewed and negative except where noted in HPI.   Physical Exam:  BP 127/85 mmHg  Pulse 87  Ht $R'5\' 7"'Gk$  (1.702 m)  Wt 310 lb (140.615 kg)  BMI 48.54 kg/m2 No LMP recorded. Patient is not currently having periods (Reason: Chemotherapy). Psych:  Alert and cooperative. Normal mood and affect. General:   Alert,  Well-developed, obese, well-nourished, pleasant and cooperative in NAD Head:   Normocephalic and atraumatic. Eyes:  Sclera clear, no icterus.   Conjunctiva pink. Ears:  Normal auditory acuity. Nose:  No deformity, discharge, or lesions. Mouth:  No deformity or lesions,oropharynx pink & moist. Neck:  Supple; no masses or thyromegaly. Lungs:  Respirations even and unlabored.  Clear throughout to auscultation.   No wheezes, crackles, or rhonchi. No acute distress. Heart:  Regular rate and rhythm; no murmurs, clicks, rubs, or gallops. Abdomen:  Normal bowel sounds.  No bruits.  Soft, non-tender and non-distended without masses, hepatosplenomegaly or hernias noted.  No guarding or rebound tenderness.  Negative Carnett sign.   Rectal:  Deferred.  Msk:  Symmetrical without gross deformities.  Good, equal movement & strength bilaterally. Pulses:  Normal pulses noted. Extremities:  No clubbing or edema.  No cyanosis. Neurologic:  Alert and oriented x3;  grossly normal neurologically. Skin:  Intact without significant lesions or rashes.  No jaundice. Lymph Nodes:  No significant cervical adenopathy. Psych:  Alert and cooperative. Normal mood and affect.  Imaging Studies: No results found.  Assessment and Plan:   Sherry Price is a 44 y.o. y/o female who comes today with a history of recent onset of constipation with bloating and reflux resulting in chest pain. The patient also has nausea when she has the symptoms. The patient will be started on Zofran and she will also be set up for an EGD and colonoscopy. I have discussed risks & benefits which include, but are not limited to, bleeding, infection, perforation & drug reaction.  The patient agrees with this plan & written consent will be obtained.      Note: This dictation was prepared with Dragon dictation along with smaller phrase technology. Any transcriptional errors that result from this process are unintentional.

## 2014-12-08 ENCOUNTER — Other Ambulatory Visit: Payer: Self-pay

## 2014-12-19 ENCOUNTER — Other Ambulatory Visit: Payer: Medicare Other

## 2014-12-19 ENCOUNTER — Ambulatory Visit: Payer: Medicare Other | Admitting: Oncology

## 2015-01-02 ENCOUNTER — Ambulatory Visit: Payer: Medicare Other

## 2015-01-02 ENCOUNTER — Ambulatory Visit
Admission: RE | Admit: 2015-01-02 | Discharge: 2015-01-02 | Disposition: A | Discharge status: Home / Self Care | Payer: Medicare Other | Source: Ambulatory Visit | Attending: Oncology | Admitting: Oncology

## 2015-01-02 DIAGNOSIS — N281 Cyst of kidney, acquired: Secondary | ICD-10-CM | POA: Insufficient documentation

## 2015-01-02 DIAGNOSIS — R1031 Right lower quadrant pain: Secondary | ICD-10-CM

## 2015-01-02 DIAGNOSIS — M47816 Spondylosis without myelopathy or radiculopathy, lumbar region: Secondary | ICD-10-CM | POA: Insufficient documentation

## 2015-01-02 MED ORDER — IOHEXOL 350 MG/ML SOLN
125.0000 mL | Freq: Once | INTRAVENOUS | Status: AC | PRN
  Administered 2015-01-02: 125 mL via INTRAVENOUS

## 2015-01-09 ENCOUNTER — Ambulatory Visit: Payer: Medicare Other | Admitting: Anesthesiology

## 2015-01-09 ENCOUNTER — Ambulatory Visit
Admission: RE | Admit: 2015-01-09 | Discharge: 2015-01-09 | Disposition: A | Discharge status: Home / Self Care | Payer: Medicare Other | Source: Ambulatory Visit | Attending: Gastroenterology | Admitting: Gastroenterology

## 2015-01-09 ENCOUNTER — Encounter: Payer: Self-pay | Admitting: Anesthesiology

## 2015-01-09 ENCOUNTER — Encounter
Admission: RE | Disposition: A | Discharge status: Home / Self Care | Payer: Self-pay | Source: Ambulatory Visit | Attending: Gastroenterology

## 2015-01-09 DIAGNOSIS — R12 Heartburn: Secondary | ICD-10-CM

## 2015-01-09 DIAGNOSIS — G4730 Sleep apnea, unspecified: Secondary | ICD-10-CM | POA: Diagnosis not present

## 2015-01-09 DIAGNOSIS — Z87891 Personal history of nicotine dependence: Secondary | ICD-10-CM | POA: Insufficient documentation

## 2015-01-09 DIAGNOSIS — K2970 Gastritis, unspecified, without bleeding: Secondary | ICD-10-CM | POA: Diagnosis not present

## 2015-01-09 DIAGNOSIS — F319 Bipolar disorder, unspecified: Secondary | ICD-10-CM | POA: Diagnosis not present

## 2015-01-09 DIAGNOSIS — I10 Essential (primary) hypertension: Secondary | ICD-10-CM | POA: Diagnosis not present

## 2015-01-09 DIAGNOSIS — K219 Gastro-esophageal reflux disease without esophagitis: Secondary | ICD-10-CM | POA: Diagnosis not present

## 2015-01-09 DIAGNOSIS — Z853 Personal history of malignant neoplasm of breast: Secondary | ICD-10-CM | POA: Insufficient documentation

## 2015-01-09 DIAGNOSIS — E119 Type 2 diabetes mellitus without complications: Secondary | ICD-10-CM | POA: Insufficient documentation

## 2015-01-09 DIAGNOSIS — K5281 Eosinophilic gastritis or gastroenteritis: Secondary | ICD-10-CM | POA: Insufficient documentation

## 2015-01-09 DIAGNOSIS — R194 Change in bowel habit: Secondary | ICD-10-CM

## 2015-01-09 DIAGNOSIS — M1990 Unspecified osteoarthritis, unspecified site: Secondary | ICD-10-CM | POA: Diagnosis not present

## 2015-01-09 DIAGNOSIS — Z6841 Body Mass Index (BMI) 40.0 and over, adult: Secondary | ICD-10-CM | POA: Insufficient documentation

## 2015-01-09 DIAGNOSIS — D122 Benign neoplasm of ascending colon: Secondary | ICD-10-CM

## 2015-01-09 DIAGNOSIS — K5730 Diverticulosis of large intestine without perforation or abscess without bleeding: Secondary | ICD-10-CM | POA: Insufficient documentation

## 2015-01-09 HISTORY — PX: ESOPHAGOGASTRODUODENOSCOPY (EGD) WITH PROPOFOL: SHX5813

## 2015-01-09 HISTORY — PX: COLONOSCOPY WITH PROPOFOL: SHX5780

## 2015-01-09 LAB — GLUCOSE, CAPILLARY: Glucose-Capillary: 117 mg/dL — ABNORMAL HIGH (ref 65–99)

## 2015-01-09 SURGERY — COLONOSCOPY WITH PROPOFOL
Anesthesia: General

## 2015-01-09 MED ORDER — PROPOFOL INFUSION 10 MG/ML OPTIME
INTRAVENOUS | Status: DC | PRN
Start: 2015-01-09 — End: 2015-01-09
  Administered 2015-01-09: 120 ug/kg/min via INTRAVENOUS

## 2015-01-09 MED ORDER — MIDAZOLAM HCL 2 MG/2ML IJ SOLN
INTRAMUSCULAR | Status: DC | PRN
  Administered 2015-01-09: 1 mg via INTRAVENOUS

## 2015-01-09 MED ORDER — LIDOCAINE HCL (CARDIAC) 20 MG/ML IV SOLN
INTRAVENOUS | Status: DC | PRN
  Administered 2015-01-09: 50 mg via INTRAVENOUS

## 2015-01-09 MED ORDER — SODIUM CHLORIDE 0.9 % IV SOLN
INTRAVENOUS | Status: DC
  Administered 2015-01-09: 1000 mL via INTRAVENOUS

## 2015-01-09 MED ORDER — GLYCOPYRROLATE 0.2 MG/ML IJ SOLN
INTRAMUSCULAR | Status: DC | PRN
  Administered 2015-01-09: 0.1 mg via INTRAVENOUS

## 2015-01-09 MED ORDER — FENTANYL CITRATE (PF) 100 MCG/2ML IJ SOLN
INTRAMUSCULAR | Status: DC | PRN
  Administered 2015-01-09: 50 ug via INTRAVENOUS

## 2015-01-09 NOTE — Op Note (Signed)
[facility] Gastroenterology Patient Name: Sherry Price Procedure Date: 01/09/2015 11:06 AM MRN: [id number] Account #: [id number] Date of Birth: 02/21/1971 Admit Type: Outpatient Age: 44 Room: [facility] ENDO ROOM 4 Gender: Female Note Status: Finalized Procedure:         Colonoscopy Indications:       Change in bowel habits, Incidental constipation noted Providers:         Lucilla Lame, MD Referring MD:      Marguerita Merles, MD (Referring MD) Medicines:         Propofol per Anesthesia Complications:     No immediate complications. Procedure:         Pre-Anesthesia Assessment:                    - Prior to the procedure, a History and Physical was                     performed, and patient medications and allergies were                     reviewed. The patient's tolerance of previous anesthesia                     was also reviewed. The risks and benefits of the procedure                     and the sedation options and risks were discussed with the                     patient. All questions were answered, and informed consent                     was obtained. Prior Anticoagulants: The patient has taken                     no previous anticoagulant or antiplatelet agents. ASA                     Grade Assessment: II - A patient with mild systemic                     disease. After reviewing the risks and benefits, the                     patient was deemed in satisfactory condition to undergo                     the procedure.                    After obtaining informed consent, the colonoscope was                     passed under direct vision. Throughout the procedure, the                     patient's blood pressure, pulse, and oxygen saturations                     were monitored continuously. The Olympus PCF-H180AL                     colonoscope ( S#: [id number] ) was introduced through the  anus and advanced to the the cecum, identified by              appendiceal orifice and ileocecal valve. The colonoscopy                     was performed without difficulty. The patient tolerated                     the procedure well. The quality of the bowel preparation                     was excellent. Findings:      The perianal and digital rectal examinations were normal.      A 3 mm polyp was found in the ascending colon. The polyp was sessile.       The polyp was removed with a cold biopsy forceps. Resection and       retrieval were complete.      Multiple small-mouthed diverticula were found in the sigmoid colon. Impression:        - One 3 mm polyp in the ascending colon. Resected and                     retrieved.                    - Diverticulosis in the sigmoid colon. Recommendation:    - Await pathology results.                    - Repeat colonoscopy in 5 years if polyp adenoma and 10                     years if hyperplastic Procedure Code(s): --- Professional ---                    [phone], Colonoscopy, flexible; with biopsy, single or                     multiple Diagnosis Code(s): --- Professional ---                    R19.4, Change in bowel habit                    D12.2, Benign neoplasm of ascending colon                    K57.30, Diverticulosis of large intestine without                     perforation or abscess without bleeding CPT copyright 2014 American Medical Association. All rights reserved. The codes documented in this report are preliminary and upon coder review may  be revised to meet current compliance requirements. Lucilla Lame, MD 01/09/2015 11:31:58 AM This report has been signed electronically. Number of Addenda: 0 Note Initiated On: 01/09/2015 11:06 AM Scope Withdrawal Time: 0 hours 4 minutes 46 seconds  Total Procedure Duration: 0 hours 12 minutes 23 seconds       [facility]

## 2015-01-09 NOTE — Op Note (Signed)
[facility] Gastroenterology Patient Name: Sherry Price Procedure Date: 01/09/2015 11:08 AM MRN: [id number] Account #: [id number] Date of Birth: 1970-11-21 Admit Type: Outpatient Age: 44 Room: [facility] ENDO ROOM 4 Gender: Female Note Status: Finalized Procedure:         Upper GI endoscopy Indications:       Heartburn Providers:         Lucilla Lame, MD Referring MD:      Marguerita Merles, MD (Referring MD) Medicines:         Propofol per Anesthesia Complications:     No immediate complications. Procedure:         Pre-Anesthesia Assessment:                    - Prior to the procedure, a History and Physical was                     performed, and patient medications and allergies were                     reviewed. The patient's tolerance of previous anesthesia                     was also reviewed. The risks and benefits of the procedure                     and the sedation options and risks were discussed with the                     patient. All questions were answered, and informed consent                     was obtained. Prior Anticoagulants: The patient has taken                     no previous anticoagulant or antiplatelet agents. ASA                     Grade Assessment: II - A patient with mild systemic                     disease. After reviewing the risks and benefits, the                     patient was deemed in satisfactory condition to undergo                     the procedure.                    After obtaining informed consent, the endoscope was passed                     under direct vision. Throughout the procedure, the                     patient's blood pressure, pulse, and oxygen saturations                     were monitored continuously. The Endoscope was introduced                     through the mouth, and advanced to the second part of  duodenum. The upper GI endoscopy was accomplished without                     difficulty.  The patient tolerated the procedure well. Findings:      The examined esophagus was normal.      Localized mild inflammation characterized by erythema was found in the       gastric antrum. Biopsies were taken with a cold forceps for histology.      The examined duodenum was normal. Impression:        - Normal esophagus.                    - Gastritis. Biopsied.                    - Normal examined duodenum. Recommendation:    - Await pathology results. Procedure Code(s): --- Professional ---                    [phone], Esophagogastroduodenoscopy, flexible, transoral;                     with biopsy, single or multiple Diagnosis Code(s): --- Professional ---                    R12, Heartburn                    K29.70, Gastritis, unspecified, without bleeding CPT copyright 2014 American Medical Association. All rights reserved. The codes documented in this report are preliminary and upon coder review may  be revised to meet current compliance requirements. Lucilla Lame, MD 01/09/2015 11:15:15 AM This report has been signed electronically. Number of Addenda: 0 Note Initiated On: 01/09/2015 11:08 AM      [facility]

## 2015-01-09 NOTE — Transfer of Care (Signed)
Immediate Anesthesia Transfer of Care Note  Patient: Sherry Price  Procedure(s) Performed: Procedure(s): COLONOSCOPY WITH PROPOFOL (N/A) ESOPHAGOGASTRODUODENOSCOPY (EGD) WITH PROPOFOL (N/A)  Patient Location: PACU  Anesthesia Type:General  Level of Consciousness: awake, alert , oriented and sedated  Airway & Oxygen Therapy: Patient Spontanous Breathing and Patient connected to nasal cannula oxygen  Post-op Assessment: Report given to RN and Post -op Vital signs reviewed and stable  Post vital signs: Reviewed and stable  Last Vitals:  Filed Vitals:   01/09/15 0953  BP: 156/107  Pulse: 86  Temp: 36.9 C  Resp: 15    Complications: No apparent anesthesia complications

## 2015-01-09 NOTE — Anesthesia Postprocedure Evaluation (Signed)
  Anesthesia Post-op Note  Patient: Sherry Price  Procedure(s) Performed: Procedure(s): COLONOSCOPY WITH PROPOFOL (N/A) ESOPHAGOGASTRODUODENOSCOPY (EGD) WITH PROPOFOL (N/A)  Anesthesia type:General  Patient location: PACU  Post pain: Pain level controlled  Post assessment: Post-op Vital signs reviewed, Patient's Cardiovascular Status Stable, Respiratory Function Stable, Patent Airway and No signs of Nausea or vomiting  Post vital signs: Reviewed and stable  Last Vitals:  Filed Vitals:   01/09/15 1204  BP: 133/92  Pulse: 63  Temp:   Resp: 18    Level of consciousness: awake, alert  and patient cooperative  Complications: No apparent anesthesia complications

## 2015-01-09 NOTE — Anesthesia Preprocedure Evaluation (Addendum)
Anesthesia Evaluation  Patient identified by MRN, date of birth, ID band Patient awake    Reviewed: Allergy & Precautions, H&P , NPO status , Patient's Chart, lab work & pertinent test results  Airway Mallampati: II  TM Distance: >3 FB Neck ROM: full    Dental no notable dental hx. (+) Teeth Intact   Pulmonary sleep apnea , former smoker,  breath sounds clear to auscultation  Pulmonary exam normal       Cardiovascular Exercise Tolerance: Good hypertension, Normal cardiovascular examRhythm:regular Rate:Normal     Neuro/Psych PSYCHIATRIC DISORDERS Anxiety Bipolar Disorder negative neurological ROS  negative psych ROS   GI/Hepatic Neg liver ROS, GERD-  Controlled,  Endo/Other  diabetes  Renal/GU negative Renal ROS  negative genitourinary   Musculoskeletal  (+) Arthritis -,   Abdominal   Peds  Hematology negative hematology ROS (+)   Anesthesia Other Findings Past Medical History:   Diabetes mellitus without complication                       Arthritis                                                    Hypertension                                                 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   Sleep apnea                                                  Panic attacks                                               Morbid Obesity BMI 47   Reproductive/Obstetrics negative OB ROS                            Anesthesia Physical Anesthesia Plan  ASA: III  Anesthesia Plan: General   Post-op Pain Management:    Induction:   Airway Management Planned:   Additional Equipment:   Intra-op Plan:   Post-operative Plan:   Informed Consent: I have reviewed the patients History and Physical, chart, labs and discussed the  procedure including the risks, benefits and alternatives for the proposed anesthesia with the patient or authorized representative who has indicated his/her understanding and acceptance.   Dental Advisory Given  Plan Discussed with: Anesthesiologist, CRNA and Surgeon  Anesthesia Plan Comments:         Anesthesia Quick Evaluation

## 2015-01-09 NOTE — H&P (Signed)
[facility]  [address] [place], [place] [address] Phone: [phone] Fax : [phone]  Primary Care Physician:  Marguerita Merles, MD Primary Gastroenterologist:  Dr. Allen Norris  Pre-Procedure History & Physical: HPI:  Sherry Price is a 44 y.o. female is here for an endoscopy and colonoscopy.   Past Medical History  Diagnosis Date  . Diabetes mellitus without complication   . Arthritis   . Hypertension   . 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  . Sleep apnea   . Panic attacks     Past Surgical History  Procedure Laterality Date  . Breast lumpectomy  2013  . Breast reduction surgery  2005  . Oophorectomy Right 2013  . Breast mammosite Left February 2013    Prior to Admission medications   Medication Sig Start Date End Date Taking? Authorizing Provider  ALPRAZolam Duanne Moron) 1 MG tablet Take 1 mg by mouth 2 (two) times daily.  01/16/14  Yes Historical Provider, MD  DOCQLACE 100 MG capsule TK 1 C PO BID 11/03/14  Yes Historical Provider, MD  Furosemide (LASIX PO) Take 1 tablet by mouth daily.   Yes Historical Provider, MD  hydrochlorothiazide (HYDRODIURIL) 25 MG tablet Take 25 mg by mouth daily.  01/22/14  Yes Historical Provider, MD  ibuprofen (ADVIL,MOTRIN) 200 MG tablet Take 200 mg by mouth.   Yes Historical Provider, MD  lactulose (CHRONULAC) 10 GM/15ML solution  11/03/14  Yes Historical Provider, MD  losartan (COZAAR) 25 MG tablet Take 25 mg by mouth daily.   Yes Historical Provider, MD  metFORMIN (GLUCOPHAGE) 500 MG tablet Take 500 mg by mouth daily with breakfast.  10/23/13  Yes Historical Provider, MD  ondansetron (ZOFRAN ODT) 4 MG disintegrating tablet Take 1 tablet (4 mg total) by mouth every 8 (eight) hours as needed for nausea or vomiting. 12/07/14  Yes Lucilla Lame, MD  POTASSIUM PO Take 1 tablet by mouth 2 (two)  times daily.   Yes Historical Provider, MD  ranitidine (ZANTAC) 150 MG capsule Take 1 capsule (150 mg total) by mouth 2 (two) times daily. 10/07/14  Yes Carrie Mew, MD  sucralfate (CARAFATE) 1 G tablet Take 1 tablet (1 g total) by mouth 4 (four) times daily. 10/07/14  Yes Carrie Mew, MD  tamoxifen (NOLVADEX) 20 MG tablet Take 20 mg by mouth daily.  01/11/14  Yes Historical Provider, MD  venlafaxine XR (EFFEXOR-XR) 150 MG 24 hr capsule Take 150 mg by mouth daily with breakfast.  01/11/14  Yes Historical Provider, MD    Allergies as of 12/08/2014  . (No Known Allergies)    Family History  Problem Relation Age of Onset  . Hypertension Mother   . Heart disease Mother   . Diabetes Mother     Social History   Social History  . Marital Status: Single    Spouse Name: N/A  . Number of Children: N/A  . Years of Education: N/A   Occupational History  . Not on file.   Social History Main Topics  . Smoking status: Former Smoker -- 26 years  . Smokeless tobacco: Never Used  . Alcohol Use: Yes     Comment: occasionally  . Drug Use: No  . Sexual Activity: Not on file   Other Topics Concern  . Not on file   Social History Narrative    Review of Systems: See  HPI, otherwise negative ROS  Physical Exam: BP 156/107 mmHg  Pulse 86  Temp(Src) 98.4 F (36.9 C)  Resp 15  Ht $R'5\' 7"'Yy$  (1.702 m)  Wt 305 lb (138.347 kg)  BMI 47.76 kg/m2  SpO2 100% General:   Alert,  pleasant and cooperative in NAD Head:  Normocephalic and atraumatic. Neck:  Supple; no masses or thyromegaly. Lungs:  Clear throughout to auscultation.    Heart:  Regular rate and rhythm. Abdomen:  Soft, nontender and nondistended. Normal bowel sounds, without guarding, and without rebound.   Neurologic:  Alert and  oriented x4;  grossly normal neurologically.  Impression/Plan: Sherry Price is here for an endoscopy and colonoscopy to be performed for nausea and constipation.  Risks, benefits, limitations, and  alternatives regarding  endoscopy and colonoscopy have been reviewed with the patient.  Questions have been answered.  All parties agreeable.   Ollen Bowl, MD  01/09/2015, 10:37 AM

## 2015-01-11 ENCOUNTER — Telehealth: Payer: Self-pay | Admitting: Gastroenterology

## 2015-01-11 ENCOUNTER — Inpatient Hospital Stay (HOSPITAL_BASED_OUTPATIENT_CLINIC_OR_DEPARTMENT_OTHER): Payer: Medicare Other | Admitting: Oncology

## 2015-01-11 ENCOUNTER — Encounter: Payer: Self-pay | Admitting: Oncology

## 2015-01-11 ENCOUNTER — Inpatient Hospital Stay: Payer: Medicare Other | Attending: Oncology

## 2015-01-11 VITALS — BP 157/112 | HR 85 | Temp 96.5°F | Wt 315.2 lb

## 2015-01-11 DIAGNOSIS — Z79810 Long term (current) use of selective estrogen receptor modulators (SERMs): Secondary | ICD-10-CM | POA: Insufficient documentation

## 2015-01-11 DIAGNOSIS — R112 Nausea with vomiting, unspecified: Secondary | ICD-10-CM | POA: Insufficient documentation

## 2015-01-11 DIAGNOSIS — M129 Arthropathy, unspecified: Secondary | ICD-10-CM

## 2015-01-11 DIAGNOSIS — Z9221 Personal history of antineoplastic chemotherapy: Secondary | ICD-10-CM

## 2015-01-11 DIAGNOSIS — F329 Major depressive disorder, single episode, unspecified: Secondary | ICD-10-CM | POA: Diagnosis not present

## 2015-01-11 DIAGNOSIS — C50412 Malignant neoplasm of upper-outer quadrant of left female breast: Secondary | ICD-10-CM

## 2015-01-11 DIAGNOSIS — R1031 Right lower quadrant pain: Secondary | ICD-10-CM | POA: Insufficient documentation

## 2015-01-11 DIAGNOSIS — G4730 Sleep apnea, unspecified: Secondary | ICD-10-CM | POA: Diagnosis not present

## 2015-01-11 DIAGNOSIS — C50419 Malignant neoplasm of upper-outer quadrant of unspecified female breast: Secondary | ICD-10-CM

## 2015-01-11 DIAGNOSIS — Z170 Estrogen receptor positive status [ER+]: Secondary | ICD-10-CM

## 2015-01-11 DIAGNOSIS — Z79899 Other long term (current) drug therapy: Secondary | ICD-10-CM | POA: Diagnosis not present

## 2015-01-11 DIAGNOSIS — F410 Panic disorder [episodic paroxysmal anxiety] without agoraphobia: Secondary | ICD-10-CM

## 2015-01-11 DIAGNOSIS — I10 Essential (primary) hypertension: Secondary | ICD-10-CM | POA: Diagnosis not present

## 2015-01-11 DIAGNOSIS — M479 Spondylosis, unspecified: Secondary | ICD-10-CM | POA: Diagnosis not present

## 2015-01-11 DIAGNOSIS — N281 Cyst of kidney, acquired: Secondary | ICD-10-CM | POA: Insufficient documentation

## 2015-01-11 DIAGNOSIS — Z87891 Personal history of nicotine dependence: Secondary | ICD-10-CM | POA: Diagnosis not present

## 2015-01-11 DIAGNOSIS — E119 Type 2 diabetes mellitus without complications: Secondary | ICD-10-CM | POA: Insufficient documentation

## 2015-01-11 LAB — COMPREHENSIVE METABOLIC PANEL
ALT: 13 U/L — ABNORMAL LOW (ref 14–54)
AST: 15 U/L (ref 15–41)
Albumin: 3.8 g/dL (ref 3.5–5.0)
Alkaline Phosphatase: 56 U/L (ref 38–126)
Anion gap: 7 (ref 5–15)
BILIRUBIN TOTAL: 0.2 mg/dL — AB (ref 0.3–1.2)
BUN: 9 mg/dL (ref 6–20)
CHLORIDE: 102 mmol/L (ref 101–111)
CO2: 28 mmol/L (ref 22–32)
Calcium: 7.9 mg/dL — ABNORMAL LOW (ref 8.9–10.3)
Creatinine, Ser: 0.64 mg/dL (ref 0.44–1.00)
Glucose, Bld: 106 mg/dL — ABNORMAL HIGH (ref 65–99)
POTASSIUM: 4 mmol/L (ref 3.5–5.1)
Sodium: 137 mmol/L (ref 135–145)
TOTAL PROTEIN: 7.1 g/dL (ref 6.5–8.1)

## 2015-01-11 LAB — CBC WITH DIFFERENTIAL/PLATELET
BASOS ABS: 0.1 10*3/uL (ref 0–0.1)
Basophils Relative: 1 %
EOS PCT: 5 %
Eosinophils Absolute: 0.3 10*3/uL (ref 0–0.7)
HEMATOCRIT: 38.2 % (ref 35.0–47.0)
Hemoglobin: 12.7 g/dL (ref 12.0–16.0)
LYMPHS ABS: 2.7 10*3/uL (ref 1.0–3.6)
LYMPHS PCT: 37 %
MCH: 29.4 pg (ref 26.0–34.0)
MCHC: 33.3 g/dL (ref 32.0–36.0)
MCV: 88.5 fL (ref 80.0–100.0)
MONO ABS: 0.5 10*3/uL (ref 0.2–0.9)
MONOS PCT: 8 %
NEUTROS ABS: 3.6 10*3/uL (ref 1.4–6.5)
Neutrophils Relative %: 49 %
PLATELETS: 279 10*3/uL (ref 150–440)
RBC: 4.31 MIL/uL (ref 3.80–5.20)
RDW: 13.6 % (ref 11.5–14.5)
WBC: 7.2 10*3/uL (ref 3.6–11.0)

## 2015-01-11 LAB — SURGICAL PATHOLOGY

## 2015-01-11 NOTE — Telephone Encounter (Signed)
Please review pt's pathology and advise.

## 2015-01-11 NOTE — Progress Notes (Signed)
[place] @ [facility] [phone]  [phone] Happy: Oct 01, 1970  MR#: [id number]  GLO#:[id number]  Patient Care Team: Marguerita Merles, MD as PCP - General (Family Medicine) Forest Gleason, MD (Unknown Physician Specialty) Robert Bellow, MD (General Surgery)  CHIEF COMPLAINT:  Chief Complaint  Patient presents with  . Abdominal Pain    Tenderness   Oncology History   Chief Complaint/Diagnosis:   44 year old female with 2 adjacent separate primaries of the left breast status post wide local excision the largest being a T1 C. N0 M0 lesion the other a T1 B. N0 lesion (both lesions are only  a few millimeter apart,And according to pathologist tumors are similar)Most likely we are dealing with T2, N0, M0 tumor  Estrogen receptor positive (weak staining) progesterone receptor positive (weak staining) and HER-2 receptor negative by FISH.  Status post lumpectomy on May 21, 2011 patient is on NSABP protocol B. 47 2. finished 6 cycles of chemotherapy 3. Currently on Tamoxifen        Oncology Flowsheet 09/30/2014 09/30/2014 10/07/2014  ondansetron (ZOFRAN) IJ - - -  ondansetron (ZOFRAN) IV 4 mg 4 mg -    INTERVAL HISTORY: 44 year old lady with a history of stage II carcinoma breast status post lumpectomy radiation therapy and chemotherapy.  According to patient's and was doing very well except 2 weeks ago started having right lower quadrant pain.  Patient presented to emergency room she was found to have constipation.  The scan and ultrasound was done.  CT scan revealed borderline enlarged pelvic lymph nodes.  Ultrasound was reported to be negative.  Patient continues to have some discomfort.  Constipation is a major problem.  Patient is somewhat depressed SEPTEMBER, 2016 pATIENT HAD A REPEAT ct SCAN WHICH SHOWS COMPLETE RESOLUTION OF ABNORMAL ABDOMINAL FINDING.  pATIENT CONTINUES TO BE VERY DEPRESSED.  ACCORDING TO HER   SHE  HAD A GYNECOLOGICAL  EVALUATION AND ENDOMETRIAL BIOPSY.  SHE DOES NOT REMEMBER name of gynecologist.   noVaginal bleeding. Patient is here for further follow-up and treatment consideration   REVIEW OF SYSTEMS:   Gen. status: Somewhat depressed lady not any acute distress.  HEENT: No difficulty swallowing.  GI: Right lower quadrant discomfort.  Constipation.  GU: No dysuria hematuria.  Lower extremity no swelling.  Skin: No rash.  Lungs: No shortness of breath patient does have sleep apnea syndrome cardiac: No chest pain or paroxysmal nocturnal dyspnea Neurological system no headache no dizziness Past skeletal system no bony pain As per HPI. Otherwise, a complete review of systems is negatve.  PAST MEDICAL HISTORY: Past Medical History  Diagnosis Date  . Diabetes mellitus without complication   . Arthritis   . Hypertension   . 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  . Sleep apnea   . Panic attacks     PAST SURGICAL HISTORY: Past Surgical History  Procedure Laterality Date  . Breast lumpectomy  2013  . Breast reduction surgery  2005  . Oophorectomy Right 2013  . Breast mammosite Left February 2013  . Colonoscopy with propofol N/A 01/09/2015    Procedure: COLONOSCOPY WITH PROPOFOL;  Surgeon: Lucilla Lame, MD;  Location: [facility] ENDOSCOPY;  Service: Endoscopy;  Laterality: N/A;  . Esophagogastroduodenoscopy (egd) with propofol N/A 01/09/2015    Procedure: ESOPHAGOGASTRODUODENOSCOPY (EGD) WITH PROPOFOL;  Surgeon: Lucilla Lame, MD;  Location:  [place] [facility]OSCOPY;  Service: Endoscopy;  Laterality: N/A;    FAMILY HISTORY Family History  Problem Relation Age of Onset  . Hypertension Mother   . Heart disease Mother   . Diabetes Mother     ADVANCED DIRECTIVES:  Does not have advance healthcare directive.  Information was given HEALTH MAINTENANCE: Social History  Substance Use  Topics  . Smoking status: Former Smoker -- 26 years  . Smokeless tobacco: Never Used  . Alcohol Use: Yes     Comment: occasionally      Not on File  Current Outpatient Prescriptions  Medication Sig Dispense Refill  . ALPRAZolam (XANAX) 1 MG tablet Take 1 mg by mouth 2 (two) times daily.     . DOCQLACE 100 MG capsule TK 1 C PO BID  3  . Furosemide (LASIX PO) Take 1 tablet by mouth daily.    . hydrochlorothiazide (HYDRODIURIL) 25 MG tablet Take 25 mg by mouth daily.     Marland Kitchen ibuprofen (ADVIL,MOTRIN) 200 MG tablet Take 200 mg by mouth.    . lactulose (CHRONULAC) 10 GM/15ML solution   3  . losartan (COZAAR) 25 MG tablet Take 25 mg by mouth daily.    . metFORMIN (GLUCOPHAGE) 500 MG tablet Take 500 mg by mouth daily with breakfast.     . ondansetron (ZOFRAN ODT) 4 MG disintegrating tablet Take 1 tablet (4 mg total) by mouth every 8 (eight) hours as needed for nausea or vomiting. 30 tablet 1  . POTASSIUM PO Take 1 tablet by mouth 2 (two) times daily.    . ranitidine (ZANTAC) 150 MG capsule Take 1 capsule (150 mg total) by mouth 2 (two) times daily. 28 capsule 0  . sucralfate (CARAFATE) 1 G tablet Take 1 tablet (1 g total) by mouth 4 (four) times daily. 120 tablet 1  . tamoxifen (NOLVADEX) 20 MG tablet Take 20 mg by mouth daily.     Marland Kitchen venlafaxine XR (EFFEXOR-XR) 150 MG 24 hr capsule Take 150 mg by mouth daily with breakfast.      No current facility-administered medications for this visit.    OBJECTIVE:  Filed Vitals:   01/11/15 1437  BP: 157/112  Pulse: 85  Temp: 96.5 F (35.8 C)     Body mass index is 49.36 kg/(m^2).    ECOG FS:1 - Symptomatic but completely ambulatory  PHYSICAL EXAM: GENERAL:  Well developed, well nourished, sitting comfortably in the exam room in no acute distress. MENTAL STATUS:  Alert and oriented to person, place and time.  She is depressed HEAD:  Normocephalic, atraumatic, face symmetric, no Cushingoid features. EYES:   Pupils equal round and reactive to  light and accomodation.  No conjunctivitis or scleral icterus. ENT:  Oropharynx clear without lesion.  Tongue normal. Mucous membranes moist.  RESPIRATORY:  Clear to auscultation without rales, wheezes or rhonchi. CARDIOVASCULAR:  Regular rate and rhythm without murmur, rub or gallop. BREAST:  Right breast without masses, skin changes or nipple discharge.  Left breast without masses, skin changes or nipple discharge. ABDOMEN:  Soft, non-tender, with active bowel sounds, and no hepatosplenomegaly.  No masses. BACK:  No CVA tenderness.  No tenderness on percussion of the back or rib cage. SKIN:  No rashes, ulcers or lesions. EXTREMITIES: No edema, no skin discoloration or tenderness.  No palpable cords. LYMPH NODES: No palpable cervical, supraclavicular, axillary or inguinal adenopathy  NEUROLOGICAL: Unremarkable. PSYCH:  Appropriate.   LAB RESULTS:  Appointment on 01/11/2015  Component Date Value Ref Range Status  .  WBC 01/11/2015 7.2  3.6 - 11.0 K/uL Final  . RBC 01/11/2015 4.31  3.80 - 5.20 MIL/uL Final  . Hemoglobin 01/11/2015 12.7  12.0 - 16.0 g/dL Final  . HCT 01/11/2015 38.2  35.0 - 47.0 % Final  . MCV 01/11/2015 88.5  80.0 - 100.0 fL Final  . MCH 01/11/2015 29.4  26.0 - 34.0 pg Final  . MCHC 01/11/2015 33.3  32.0 - 36.0 g/dL Final  . RDW 01/11/2015 13.6  11.5 - 14.5 % Final  . Platelets 01/11/2015 279  150 - 440 K/uL Final  . Neutrophils Relative % 01/11/2015 49   Final  . Neutro Abs 01/11/2015 3.6  1.4 - 6.5 K/uL Final  . Lymphocytes Relative 01/11/2015 37   Final  . Lymphs Abs 01/11/2015 2.7  1.0 - 3.6 K/uL Final  . Monocytes Relative 01/11/2015 8   Final  . Monocytes Absolute 01/11/2015 0.5  0.2 - 0.9 K/uL Final  . Eosinophils Relative 01/11/2015 5   Final  . Eosinophils Absolute 01/11/2015 0.3  0 - 0.7 K/uL Final  . Basophils Relative 01/11/2015 1   Final  . Basophils Absolute 01/11/2015 0.1  0 - 0.1 K/uL Final  . Sodium 01/11/2015 137  135 - 145 mmol/L Final  .  Potassium 01/11/2015 4.0  3.5 - 5.1 mmol/L Final  . Chloride 01/11/2015 102  101 - 111 mmol/L Final  . CO2 01/11/2015 28  22 - 32 mmol/L Final  . Glucose, Bld 01/11/2015 106* 65 - 99 mg/dL Final  . BUN 01/11/2015 9  6 - 20 mg/dL Final  . Creatinine, Ser 01/11/2015 0.64  0.44 - 1.00 mg/dL Final  . Calcium 01/11/2015 7.9* 8.9 - 10.3 mg/dL Final  . Total Protein 01/11/2015 7.1  6.5 - 8.1 g/dL Final  . Albumin 01/11/2015 3.8  3.5 - 5.0 g/dL Final  . AST 01/11/2015 15  15 - 41 U/L Final  . ALT 01/11/2015 13* 14 - 54 U/L Final  . Alkaline Phosphatase 01/11/2015 56  38 - 126 U/L Final  . Total Bilirubin 01/11/2015 0.2* 0.3 - 1.2 mg/dL Final  . GFR calc non Af Amer 01/11/2015 >60  >60 mL/min Final  . GFR calc Af Amer 01/11/2015 >60  >60 mL/min Final   Comment: (NOTE) The eGFR has been calculated using the CKD EPI equation. This calculation has not been validated in all clinical situations. eGFR's persistently <60 mL/min signify possible Chronic Kidney Disease.   . Anion gap 01/11/2015 7  5 - 15 Final       STUDIES  IMPRESSION: 1. No acute findings within the abdomen or pelvis. No explanation for pain nausea and vomiting. 2. Left renal cysts. 3. Lumbar spondylosis.   Electronically Signed  By: Kerby Moors M.D.  On: 01/02/2015 15:53 Patient recently had ultrasound of abdomen in May of 2016 which has been reviewed no abnormality detected patient also had a CT scan of abdomen and pelvis done in the emergency room for evaluation regarding right lower quadrant pain and enlarged lymph node borderline enlargement was found in the right pelvic area consider inflammatory but further follow-up pending needed ASSESSMENT:MWDICAL DECISION MAKING CT scan ultrasound has been reviewed. Patient had a repeat CT scan which reveals no acute abdominal findings.  This has been reviewed independently. We will get information from a gynecologist regarding endometrial findings as patient is on  tamoxifen.. Continue follow-up without any intervention at present time She was advised to get mammogram done as well as colonoscopy area and appointment has been  made to see gastroenterologist  :    Patient expressed understanding and was in agreement with this plan. She also understands that She can call clinic at any time with any questions, concerns, or complaints.    No matching staging information was found for the patient.  Forest Gleason, MD   01/11/2015 2:47 PM

## 2015-01-11 NOTE — Telephone Encounter (Signed)
Patient is calling about path results

## 2015-01-11 NOTE — Progress Notes (Signed)
Patient does not have living will.  Former smoker.  Patient had colonoscopy on Monday.  States abdomen is still tender on left side.  One polyp removed.

## 2015-01-12 LAB — CANCER ANTIGEN 27.29: CA 27.29: 20.3 U/mL (ref 0.0–38.6)

## 2015-01-13 ENCOUNTER — Encounter: Payer: Self-pay | Admitting: Oncology

## 2015-01-18 NOTE — Telephone Encounter (Signed)
Please have the patient come in for discussion about the pathology results.

## 2015-01-19 ENCOUNTER — Telehealth: Payer: Self-pay | Admitting: *Deleted

## 2015-01-19 NOTE — Telephone Encounter (Signed)
LVM for pt to schedule follow up appt

## 2015-01-19 NOTE — Telephone Encounter (Signed)
Pt had endometrial biopsy in January 2016 by [facility] [place].

## 2015-01-19 NOTE — Telephone Encounter (Signed)
Called pt to get name of GYN that recently performed endometrial biopsy. Pt stated will look and call me back with name and number so our office can get records.

## 2015-01-19 NOTE — Telephone Encounter (Signed)
-----   Message from Lucilla Lame, MD sent at 01/15/2015  8:25 AM EDT ----- Please have the patient come in for a follow up.

## 2015-01-31 ENCOUNTER — Ambulatory Visit: Payer: Medicare Other | Admitting: Gastroenterology

## 2015-02-05 ENCOUNTER — Encounter (INDEPENDENT_AMBULATORY_CARE_PROVIDER_SITE_OTHER): Payer: Self-pay

## 2015-02-05 ENCOUNTER — Other Ambulatory Visit: Payer: Self-pay | Admitting: *Deleted

## 2015-02-05 ENCOUNTER — Ambulatory Visit (INDEPENDENT_AMBULATORY_CARE_PROVIDER_SITE_OTHER): Payer: Medicare Other | Admitting: Gastroenterology

## 2015-02-05 ENCOUNTER — Encounter: Payer: Self-pay | Admitting: Gastroenterology

## 2015-02-05 VITALS — BP 132/95 | HR 96 | Temp 98.5°F | Ht 67.0 in | Wt 322.0 lb

## 2015-02-05 DIAGNOSIS — K5281 Eosinophilic gastritis or gastroenteritis: Secondary | ICD-10-CM

## 2015-02-05 DIAGNOSIS — K2990 Gastroduodenitis, unspecified, without bleeding: Principal | ICD-10-CM

## 2015-02-05 DIAGNOSIS — K2970 Gastritis, unspecified, without bleeding: Secondary | ICD-10-CM

## 2015-02-05 MED ORDER — METHYLPREDNISOLONE 4 MG PO TBPK: ORAL_TABLET | ORAL | Status: DC

## 2015-02-05 NOTE — Patient Instructions (Signed)
Pick up your prescribed medication from your pharmacy today. Complete all of the medication. If symptoms have not resolved please give our office a call.

## 2015-02-05 NOTE — Progress Notes (Signed)
Primary Care Physician: Marguerita Merles, MD  Primary Gastroenterologist:  Dr. Lucilla Lame  Chief Complaint  Patient presents with  . Follow-up    EGD results review    HPI: Sherry Price is a 44 y.o. female here for follow-up after EGD. 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 She reports that she's been having some abdominal pain the last few days. There is no report of any black stools or bloody stools.  Current Outpatient Prescriptions  Medication Sig Dispense Refill  . ALPRAZolam (XANAX) 1 MG tablet Take 1 mg by mouth 2 (two) times daily.     . DOCQLACE 100 MG capsule TK 1 C PO BID  3  . Furosemide (LASIX PO) Take 1 tablet by mouth daily.    Marland Kitchen ibuprofen (ADVIL,MOTRIN) 200 MG tablet Take 200 mg by mouth.    . lactulose (CHRONULAC) 10 GM/15ML solution   3  . losartan (COZAAR) 25 MG tablet Take 25 mg by mouth daily.    . metFORMIN (GLUCOPHAGE) 500 MG tablet Take 500 mg by mouth daily with breakfast.     . ondansetron (ZOFRAN ODT) 4 MG disintegrating tablet Take 1 tablet (4 mg total) by mouth every 8 (eight) hours as needed for nausea or vomiting. 30 tablet 1  . POTASSIUM PO Take 1 tablet by mouth 2 (two) times daily.    . ranitidine (ZANTAC) 150 MG capsule Take 1 capsule (150 mg total) by mouth 2 (two) times daily. 28 capsule 0  . sucralfate (CARAFATE) 1 G tablet Take 1 tablet (1 g total) by mouth 4 (four) times daily. 120 tablet 1  . tamoxifen (NOLVADEX) 20 MG tablet Take 20 mg by mouth daily.     Marland Kitchen venlafaxine XR (EFFEXOR-XR) 150 MG 24 hr capsule Take 150 mg by mouth daily with breakfast.     . methylPREDNISolone (MEDROL DOSEPAK) 4 MG TBPK tablet Follow directions as printed on medication pack. 21 tablet 0   No current facility-administered medications for this visit.    Allergies as of 02/05/2015  . (No Known Allergies)    ROS:  General: Negative for anorexia, weight loss, fever, chills, fatigue, weakness. ENT: Negative for hoarseness, difficulty  swallowing , nasal congestion. CV: Negative for chest pain, angina, palpitations, dyspnea on exertion, peripheral edema.  Respiratory: Negative for dyspnea at rest, dyspnea on exertion, cough, sputum, wheezing.  GI: See history of present illness. GU:  Negative for dysuria, hematuria, urinary incontinence, urinary frequency, nocturnal urination.  Endo: Negative for unusual weight change.    Physical Examination:   BP 132/95 mmHg  Pulse 96  Temp(Src) 98.5 F (36.9 C) (Oral)  Ht 5\' 7"  (1.702 m)  Wt 322 lb (146.058 kg)  BMI 50.42 kg/m2  General: Well-nourished, well-developed in no acute distress. Morbidly obese  Eyes: No icterus. Conjunctivae pink. Mouth: Oropharyngeal mucosa moist and pink , no lesions erythema or exudate. Extremities: No lower extremity edema. No clubbing or deformities. Neuro: Alert and oriented x 3.  Grossly intact. Skin: Warm and dry, no jaundice.   Psych: Alert and cooperative, normal mood and affect.  Labs:    Imaging Studies: No results found.  Assessment and Plan:   Sherry Price is a 44 y.o. y/o female who comes in after having an upper endoscopy for abdominal pain. 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 The patient is presently having symptoms of this and will be started on a steroids taper. The patient has been told about the pathophysiology of the  disease and how it can come and go. The patient states she understands the plan.   Note: This dictation was prepared with Dragon dictation along with smaller phrase technology. Any transcriptional errors that result from this process are unintentional.

## 2015-02-17 ENCOUNTER — Other Ambulatory Visit: Payer: Self-pay | Admitting: Family Medicine

## 2015-02-19 DIAGNOSIS — I10 Essential (primary) hypertension: Secondary | ICD-10-CM | POA: Insufficient documentation

## 2015-02-27 DIAGNOSIS — R05 Cough: Secondary | ICD-10-CM | POA: Insufficient documentation

## 2015-02-27 DIAGNOSIS — R058 Other specified cough: Secondary | ICD-10-CM | POA: Insufficient documentation

## 2015-03-01 ENCOUNTER — Ambulatory Visit: Payer: Medicare Other | Admitting: Gastroenterology

## 2015-03-19 ENCOUNTER — Other Ambulatory Visit: Payer: Self-pay | Admitting: Family Medicine

## 2015-04-11 ENCOUNTER — Other Ambulatory Visit: Payer: Self-pay | Admitting: Gastroenterology

## 2015-04-12 ENCOUNTER — Other Ambulatory Visit: Payer: Self-pay | Admitting: Gastroenterology

## 2015-04-15 ENCOUNTER — Other Ambulatory Visit: Payer: Self-pay | Admitting: Family Medicine

## 2015-05-17 ENCOUNTER — Inpatient Hospital Stay: Payer: Medicare Other

## 2015-05-17 ENCOUNTER — Inpatient Hospital Stay: Payer: Medicare Other | Admitting: Oncology

## 2015-06-02 ENCOUNTER — Other Ambulatory Visit: Payer: Self-pay | Admitting: Family Medicine

## 2015-06-04 ENCOUNTER — Encounter: Payer: Self-pay | Admitting: Oncology

## 2015-06-04 ENCOUNTER — Inpatient Hospital Stay: Payer: Medicare Other

## 2015-06-04 ENCOUNTER — Inpatient Hospital Stay: Payer: Medicare Other | Attending: Oncology | Admitting: Oncology

## 2015-06-04 VITALS — BP 126/73 | HR 90 | Temp 96.4°F | Resp 18 | Wt 317.0 lb

## 2015-06-04 DIAGNOSIS — K5900 Constipation, unspecified: Secondary | ICD-10-CM | POA: Diagnosis not present

## 2015-06-04 DIAGNOSIS — Z79810 Long term (current) use of selective estrogen receptor modulators (SERMs): Secondary | ICD-10-CM | POA: Insufficient documentation

## 2015-06-04 DIAGNOSIS — I10 Essential (primary) hypertension: Secondary | ICD-10-CM | POA: Diagnosis not present

## 2015-06-04 DIAGNOSIS — E119 Type 2 diabetes mellitus without complications: Secondary | ICD-10-CM | POA: Diagnosis not present

## 2015-06-04 DIAGNOSIS — Z87891 Personal history of nicotine dependence: Secondary | ICD-10-CM | POA: Diagnosis not present

## 2015-06-04 DIAGNOSIS — F329 Major depressive disorder, single episode, unspecified: Secondary | ICD-10-CM | POA: Insufficient documentation

## 2015-06-04 DIAGNOSIS — Z9221 Personal history of antineoplastic chemotherapy: Secondary | ICD-10-CM | POA: Diagnosis not present

## 2015-06-04 DIAGNOSIS — R1031 Right lower quadrant pain: Secondary | ICD-10-CM | POA: Diagnosis not present

## 2015-06-04 DIAGNOSIS — M25552 Pain in left hip: Secondary | ICD-10-CM | POA: Insufficient documentation

## 2015-06-04 DIAGNOSIS — C50419 Malignant neoplasm of upper-outer quadrant of unspecified female breast: Secondary | ICD-10-CM

## 2015-06-04 DIAGNOSIS — E669 Obesity, unspecified: Secondary | ICD-10-CM | POA: Insufficient documentation

## 2015-06-04 DIAGNOSIS — G4730 Sleep apnea, unspecified: Secondary | ICD-10-CM | POA: Insufficient documentation

## 2015-06-04 DIAGNOSIS — Z7984 Long term (current) use of oral hypoglycemic drugs: Secondary | ICD-10-CM | POA: Insufficient documentation

## 2015-06-04 DIAGNOSIS — Z170 Estrogen receptor positive status [ER+]: Secondary | ICD-10-CM | POA: Insufficient documentation

## 2015-06-04 DIAGNOSIS — C50412 Malignant neoplasm of upper-outer quadrant of left female breast: Secondary | ICD-10-CM | POA: Diagnosis not present

## 2015-06-04 DIAGNOSIS — Z79899 Other long term (current) drug therapy: Secondary | ICD-10-CM | POA: Insufficient documentation

## 2015-06-04 DIAGNOSIS — F410 Panic disorder [episodic paroxysmal anxiety] without agoraphobia: Secondary | ICD-10-CM | POA: Insufficient documentation

## 2015-06-04 DIAGNOSIS — M129 Arthropathy, unspecified: Secondary | ICD-10-CM | POA: Insufficient documentation

## 2015-06-04 LAB — CBC WITH DIFFERENTIAL/PLATELET
Basophils Absolute: 0.1 10*3/uL (ref 0–0.1)
Basophils Relative: 1 %
EOS ABS: 0.3 10*3/uL (ref 0–0.7)
EOS PCT: 5 %
HCT: 37.9 % (ref 35.0–47.0)
Hemoglobin: 12.8 g/dL (ref 12.0–16.0)
LYMPHS ABS: 2.1 10*3/uL (ref 1.0–3.6)
Lymphocytes Relative: 34 %
MCH: 30.2 pg (ref 26.0–34.0)
MCHC: 33.7 g/dL (ref 32.0–36.0)
MCV: 89.8 fL (ref 80.0–100.0)
MONO ABS: 0.4 10*3/uL (ref 0.2–0.9)
Monocytes Relative: 6 %
Neutro Abs: 3.3 10*3/uL (ref 1.4–6.5)
Neutrophils Relative %: 54 %
PLATELETS: 288 10*3/uL (ref 150–440)
RBC: 4.22 MIL/uL (ref 3.80–5.20)
RDW: 14 % (ref 11.5–14.5)
WBC: 6.2 10*3/uL (ref 3.6–11.0)

## 2015-06-04 LAB — COMPREHENSIVE METABOLIC PANEL
ALK PHOS: 47 U/L (ref 38–126)
ALT: 15 U/L (ref 14–54)
ANION GAP: 6 (ref 5–15)
AST: 16 U/L (ref 15–41)
Albumin: 3.9 g/dL (ref 3.5–5.0)
BUN: 13 mg/dL (ref 6–20)
CALCIUM: 8.6 mg/dL — AB (ref 8.9–10.3)
CO2: 28 mmol/L (ref 22–32)
Chloride: 103 mmol/L (ref 101–111)
Creatinine, Ser: 0.62 mg/dL (ref 0.44–1.00)
GFR calc non Af Amer: >60 mL/min (ref >60)
Glucose, Bld: 123 mg/dL — ABNORMAL HIGH (ref 65–99)
Potassium: 3.8 mmol/L (ref 3.5–5.1)
SODIUM: 137 mmol/L (ref 135–145)
Total Bilirubin: 0.3 mg/dL (ref 0.3–1.2)
Total Protein: 7.5 g/dL (ref 6.5–8.1)

## 2015-06-04 NOTE — Progress Notes (Signed)
Patient states she is having left hip pain that radiates into her groin.

## 2015-06-04 NOTE — Progress Notes (Signed)
[place] @ [facility] [phone]) [phone]  Fax:(336) [place]: Jan 21, 1971  MR#: [id number]  JYN#:[id number]  Patient Care Team: Marguerita Merles, MD as PCP - General (Family Medicine) Forest Gleason, MD (Unknown Physician Specialty) Robert Bellow, MD (General Surgery) Maceo Pro, MD as Consulting Physician (Obstetrics and Gynecology)  CHIEF COMPLAINT:  Chief Complaint  Patient presents with  . Breast Cancer   Oncology History   Chief Complaint/Diagnosis:   45 year old female with 2 adjacent separate primaries of the left breast status post wide local excision the largest being a T1 C. N0 M0 lesion the other a T1 B. N0 lesion (both lesions are only  a few millimeter apart,And according to pathologist tumors are similar)Most likely we are dealing with T2, N0, M0 tumor  Estrogen receptor positive (weak staining) progesterone receptor positive (weak staining) and HER-2 receptor negative by FISH.  Status post lumpectomy on May 21, 2011 patient is on NSABP protocol B. 47 2. finished 6 cycles of chemotherapy 3. Currently on Tamoxifen        Oncology Flowsheet 09/30/2014 09/30/2014 10/07/2014  ondansetron (ZOFRAN) IJ - - -  ondansetron (ZOFRAN) IV 4 mg 4 mg -    INTERVAL HISTORY: 45 year old lady with a history of stage II carcinoma breast status post lumpectomy radiation therapy and chemotherapy.  According to patient's and was doing very well except 2 weeks ago started having right lower quadrant pain.  Patient presented to emergency room she was found to have constipation.  The scan and ultrasound was done.  CT scan revealed borderline enlarged pelvic lymph nodes.  Ultrasound was reported to be negative.  Patient continues to have some discomfort.  Constipation is a major problem.  Patient is somewhat depressed SEPTEMBER, 2016 pATIENT HAD A REPEAT ct SCAN WHICH SHOWS COMPLETE RESOLUTION OF ABNORMAL ABDOMINAL FINDING.  pATIENT CONTINUES TO BE VERY  DEPRESSED.  ACCORDING TO HER   SHE  HAD A GYNECOLOGICAL EVALUATION AND ENDOMETRIAL BIOPSY.  SHE DOES NOT REMEMBER name of gynecologist.   noVaginal bleeding. Patient is here for further follow-up and treatment consideration According to the patient biopsy has been reported to be negative. PATIENT complains of left flank pain and left hip pain. Here for further follow-up and treatment consideration    REVIEW OF SYSTEMS:   Gen. status: Somewhat depressed lady not any acute distress Patient is moderately obese and somewhat depressed.  HEENT: No difficulty swallowing.  GI: Right lower quadrant discomfort.  Constipation.  GU: No dysuria hematuria.  Lower extremity no swelling.  Skin: No rash.  Lungs: No shortness of breath patient does have sleep apnea syndrome cardiac: No chest pain or paroxysmal nocturnal dyspnea Neurological system no headache no dizziness   Recent complaints of left flank pain left hip pain or group to 3 weeks.  No vaginal bleeding. As per HPI. Otherwise, a complete review of systems is negatve.  PAST MEDICAL HISTORY: Past Medical History  Diagnosis Date  . Diabetes mellitus without complication ([place])   . Arthritis   . Hypertension   . Malignant neoplasm of 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 ([place]) 05/21/2011    Left breast, 2 foci: 1.2 and 1.5 cm, histologic grade 3, ER 1-5%; PR 1-5%, HER-2/neu not overexpressed.  . Sleep apnea   . Panic attacks     PAST SURGICAL HISTORY: Past Surgical History  Procedure Laterality Date  . Breast lumpectomy  2013  . Breast reduction surgery  2005  . Oophorectomy Right 2013  .  Breast mammosite Left February 2013  . Colonoscopy with propofol N/A 01/09/2015    Procedure: COLONOSCOPY WITH PROPOFOL;  Surgeon: Lucilla Lame, MD;  Location: [facility] ENDOSCOPY;  Service: Endoscopy;  Laterality: N/A;  . Esophagogastroduodenoscopy (egd) with propofol N/A 01/09/2015    Procedure: ESOPHAGOGASTRODUODENOSCOPY (EGD) WITH PROPOFOL;  Surgeon: Lucilla Lame, MD;  Location: [facility] ENDOSCOPY;  Service: Endoscopy;  Laterality: N/A;    FAMILY HISTORY Family History  Problem Relation Age of Onset  . Hypertension Mother   . Heart disease Mother   . Diabetes Mother     ADVANCED DIRECTIVES:  Does not have advance healthcare directive.  Information was given HEALTH MAINTENANCE: Social History  Substance Use Topics  . Smoking status: Former Smoker -- 26 years  . Smokeless tobacco: Never Used  . Alcohol Use: Yes     Comment: occasionally      No Known Allergies  Current Outpatient Prescriptions  Medication Sig Dispense Refill  . ALPRAZolam (XANAX) 1 MG tablet Take 1 mg by mouth 2 (two) times daily.     . Furosemide (LASIX PO) Take 1 tablet by mouth daily.    . Ginkgo Biloba (GINKOBA PO) Take 25 mg by mouth daily.    Marland Kitchen losartan (COZAAR) 25 MG tablet Take 25 mg by mouth daily.    . Melatonin 10 MG TBDP Take by mouth.    . metFORMIN (GLUCOPHAGE) 500 MG tablet Take 500 mg by mouth daily with breakfast.     . methylPREDNISolone (MEDROL DOSEPAK) 4 MG TBPK tablet Follow directions as printed on medication pack. 21 tablet 0  . ondansetron (ZOFRAN ODT) 4 MG disintegrating tablet Take 1 tablet (4 mg total) by mouth every 8 (eight) hours as needed for nausea or vomiting. 30 tablet 1  . POTASSIUM PO Take 1 tablet by mouth 2 (two) times daily.    . tamoxifen (NOLVADEX) 20 MG tablet TAKE 1 TABLET BY MOUTH EVERY DAY 30 tablet 0  . venlafaxine XR (EFFEXOR-XR) 150 MG 24 hr capsule Take 150 mg by mouth daily with breakfast.     . busPIRone (BUSPAR) 5 MG tablet TK 1 T PO BID. DO NOT DRIVE OR OPERATE MACHINERY  0  . lactulose (CHRONULAC) 10 GM/15ML solution Reported on 06/04/2015  3   No current facility-administered medications for this visit.    OBJECTIVE:  Filed Vitals:   06/04/15 0958  BP: 126/73  Pulse: 90  Temp: 96.4 F (35.8 C)  Resp: 18     Body mass index is 49.64 kg/(m^2).    ECOG FS:1 - Symptomatic but completely  ambulatory  PHYSICAL EXAM: GENERAL:  Well developed, well nourished, sitting comfortably in the exam room in no acute distress. MENTAL STATUS:  Alert and o patient is moderately obese riented to person, place and time.  She is depressed HEAD:  Normocephalic, atraumatic, face symmetric, no Cushingoid features. EYES:   Pupils equal round and reactive to light and accomodation.  No conjunctivitis or scleral icterus. ENT:  Oropharynx clear without lesion.  Tongue normal. Mucous membranes moist.  RESPIRATORY:  Clear to auscultation without rales, wheezes or rhonchi. CARDIOVASCULAR:  Regular rate and rhythm without murmur, rub or gallop. BREAST:  Right breast without masses, skin changes or nipple discharge.  Left breast without masses, skin changes or nipple discharge.  Examination of both breasts shows no evidence of palpable masses.  Right breast shows changes of radiation therapy ABDOMEN:  Soft, non-tender, with active bowel sounds, and no hepatosplenomegaly.  No masses. BACK:  No CVA  tenderness.  No tenderness on percussion of the back or rib cage. SKIN:  No rashes, ulcers or lesions. EXTREMITIES: No edema, no skin discoloration or tenderness.  No palpable cords. LYMPH NODES: No palpable cervical, supraclavicular, axillary or inguinal adenopathy  NEUROLOGICAL: Unremarkable. PSYCH:  Appropriate.   LAB RESULTS:  Appointment on 06/04/2015  Component Date Value Ref Range Status  . Sodium 06/04/2015 137  135 - 145 mmol/L Final  . Potassium 06/04/2015 3.8  3.5 - 5.1 mmol/L Final  . Chloride 06/04/2015 103  101 - 111 mmol/L Final  . CO2 06/04/2015 28  22 - 32 mmol/L Final  . Glucose, Bld 06/04/2015 123* 65 - 99 mg/dL Final  . BUN 06/04/2015 13  6 - 20 mg/dL Final  . Creatinine, Ser 06/04/2015 0.62  0.44 - 1.00 mg/dL Final  . Calcium 06/04/2015 8.6* 8.9 - 10.3 mg/dL Final  . Total Protein 06/04/2015 7.5  6.5 - 8.1 g/dL Final  . Albumin 06/04/2015 3.9  3.5 - 5.0 g/dL Final  . AST 06/04/2015  16  15 - 41 U/L Final  . ALT 06/04/2015 15  14 - 54 U/L Final  . Alkaline Phosphatase 06/04/2015 47  38 - 126 U/L Final  . Total Bilirubin 06/04/2015 0.3  0.3 - 1.2 mg/dL Final  . GFR calc non Af Amer 06/04/2015 >60  >60 mL/min Final  . GFR calc Af Amer 06/04/2015 >60  >60 mL/min Final   Comment: (NOTE) The eGFR has been calculated using the CKD EPI equation. This calculation has not been validated in all clinical situations. eGFR's persistently <60 mL/min signify possible Chronic Kidney Disease.   . Anion gap 06/04/2015 6  5 - 15 Final    Assessment Arrange for the mammogram in April of 2017 Left flank pain etiology not clear there is some hip pain will proceed with bone scan Patient had an endometrial biopsy we do not have report patient was told was negative All of the lab data has been reviewed independently    t  :    Patient expressed understanding and was in agreement with this plan. She also understands that She can call clinic at any time with any questions, concerns, or complaints.    No matching staging information was found for the patient.  Forest Gleason, MD   06/04/2015 10:08 AM

## 2015-06-05 LAB — CANCER ANTIGEN 27.29: CA 27.29: 17.2 U/mL (ref 0.0–38.6)

## 2015-06-12 ENCOUNTER — Encounter: Admission: RE | Admit: 2015-06-12 | Payer: Medicare Other | Source: Ambulatory Visit

## 2015-07-10 ENCOUNTER — Other Ambulatory Visit: Payer: Self-pay | Admitting: Family Medicine

## 2015-07-16 ENCOUNTER — Other Ambulatory Visit: Payer: Self-pay | Admitting: *Deleted

## 2015-07-16 MED ORDER — TAMOXIFEN CITRATE 20 MG PO TABS: 20.0000 mg | ORAL_TABLET | Freq: Every day | ORAL | Status: DC

## 2015-08-10 ENCOUNTER — Other Ambulatory Visit: Payer: Medicare Other

## 2015-08-10 ENCOUNTER — Ambulatory Visit: Payer: Medicare Other

## 2015-08-24 ENCOUNTER — Ambulatory Visit: Payer: Medicare Other | Attending: Oncology

## 2015-08-24 ENCOUNTER — Other Ambulatory Visit: Payer: Medicare Other

## 2015-09-05 ENCOUNTER — Ambulatory Visit
Admission: RE | Admit: 2015-09-05 | Discharge: 2015-09-05 | Disposition: A | Discharge status: Home / Self Care | Payer: Medicare Other | Source: Ambulatory Visit | Attending: Oncology | Admitting: Oncology

## 2015-09-05 ENCOUNTER — Other Ambulatory Visit: Payer: Self-pay | Admitting: Oncology

## 2015-09-05 DIAGNOSIS — C50419 Malignant neoplasm of upper-outer quadrant of unspecified female breast: Secondary | ICD-10-CM

## 2015-09-05 DIAGNOSIS — Z853 Personal history of malignant neoplasm of breast: Secondary | ICD-10-CM | POA: Diagnosis present

## 2015-09-05 DIAGNOSIS — Z9889 Other specified postprocedural states: Secondary | ICD-10-CM | POA: Insufficient documentation

## 2015-09-05 DIAGNOSIS — R921 Mammographic calcification found on diagnostic imaging of breast: Secondary | ICD-10-CM | POA: Diagnosis not present

## 2015-09-05 DIAGNOSIS — M25552 Pain in left hip: Secondary | ICD-10-CM

## 2015-09-05 HISTORY — DX: Malignant neoplasm of unspecified site of unspecified female breast: C50.919

## 2015-10-28 ENCOUNTER — Emergency Department (HOSPITAL_BASED_OUTPATIENT_CLINIC_OR_DEPARTMENT_OTHER): Payer: Medicare Other

## 2015-10-28 ENCOUNTER — Encounter (HOSPITAL_BASED_OUTPATIENT_CLINIC_OR_DEPARTMENT_OTHER): Payer: Self-pay | Admitting: Emergency Medicine

## 2015-10-28 ENCOUNTER — Emergency Department (HOSPITAL_BASED_OUTPATIENT_CLINIC_OR_DEPARTMENT_OTHER)
Admission: EM | Admit: 2015-10-28 | Discharge: 2015-10-28 | Disposition: A | Discharge status: Home / Self Care | Payer: Medicare Other | Attending: Emergency Medicine | Admitting: Emergency Medicine

## 2015-10-28 DIAGNOSIS — Z853 Personal history of malignant neoplasm of breast: Secondary | ICD-10-CM | POA: Insufficient documentation

## 2015-10-28 DIAGNOSIS — Z792 Long term (current) use of antibiotics: Secondary | ICD-10-CM | POA: Diagnosis not present

## 2015-10-28 DIAGNOSIS — F410 Panic disorder [episodic paroxysmal anxiety] without agoraphobia: Secondary | ICD-10-CM | POA: Insufficient documentation

## 2015-10-28 DIAGNOSIS — R5383 Other fatigue: Secondary | ICD-10-CM | POA: Insufficient documentation

## 2015-10-28 DIAGNOSIS — Z87891 Personal history of nicotine dependence: Secondary | ICD-10-CM | POA: Diagnosis not present

## 2015-10-28 DIAGNOSIS — Z7984 Long term (current) use of oral hypoglycemic drugs: Secondary | ICD-10-CM | POA: Insufficient documentation

## 2015-10-28 DIAGNOSIS — I10 Essential (primary) hypertension: Secondary | ICD-10-CM | POA: Diagnosis not present

## 2015-10-28 DIAGNOSIS — Z79899 Other long term (current) drug therapy: Secondary | ICD-10-CM | POA: Diagnosis not present

## 2015-10-28 DIAGNOSIS — M1990 Unspecified osteoarthritis, unspecified site: Secondary | ICD-10-CM | POA: Diagnosis not present

## 2015-10-28 DIAGNOSIS — E119 Type 2 diabetes mellitus without complications: Secondary | ICD-10-CM | POA: Diagnosis not present

## 2015-10-28 DIAGNOSIS — G44039 Episodic paroxysmal hemicrania, not intractable: Secondary | ICD-10-CM | POA: Diagnosis not present

## 2015-10-28 DIAGNOSIS — R51 Headache: Secondary | ICD-10-CM | POA: Diagnosis present

## 2015-10-28 LAB — BASIC METABOLIC PANEL
ANION GAP: 7 (ref 5–15)
BUN: 14 mg/dL (ref 6–20)
CHLORIDE: 103 mmol/L (ref 101–111)
CO2: 28 mmol/L (ref 22–32)
Calcium: 8.4 mg/dL — ABNORMAL LOW (ref 8.9–10.3)
Creatinine, Ser: 0.82 mg/dL (ref 0.44–1.00)
Glucose, Bld: 111 mg/dL — ABNORMAL HIGH (ref 65–99)
Potassium: 3.6 mmol/L (ref 3.5–5.1)
SODIUM: 138 mmol/L (ref 135–145)

## 2015-10-28 LAB — URINALYSIS, ROUTINE W REFLEX MICROSCOPIC
Bilirubin Urine: NEGATIVE
GLUCOSE, UA: NEGATIVE mg/dL
HGB URINE DIPSTICK: NEGATIVE
Ketones, ur: NEGATIVE mg/dL
Nitrite: NEGATIVE
PROTEIN: NEGATIVE mg/dL
SPECIFIC GRAVITY, URINE: 1.013 (ref 1.005–1.030)
pH: 5.5 (ref 5.0–8.0)

## 2015-10-28 LAB — CBC WITH DIFFERENTIAL/PLATELET
Basophils Absolute: 0.1 10*3/uL (ref 0.0–0.1)
Basophils Relative: 1 %
Eosinophils Absolute: 0.3 10*3/uL (ref 0.0–0.7)
Eosinophils Relative: 4 %
HCT: 36.3 % (ref 36.0–46.0)
HEMOGLOBIN: 12 g/dL (ref 12.0–15.0)
LYMPHS ABS: 3 10*3/uL (ref 0.7–4.0)
LYMPHS PCT: 33 %
MCH: 29.6 pg (ref 26.0–34.0)
MCHC: 33.1 g/dL (ref 30.0–36.0)
MCV: 89.6 fL (ref 78.0–100.0)
Monocytes Absolute: 0.8 10*3/uL (ref 0.1–1.0)
Monocytes Relative: 9 %
NEUTROS PCT: 53 %
Neutro Abs: 5 10*3/uL (ref 1.7–7.7)
PLATELETS: 307 10*3/uL (ref 150–400)
RBC: 4.05 MIL/uL (ref 3.87–5.11)
RDW: 12.9 % (ref 11.5–15.5)
WBC: 9.2 10*3/uL (ref 4.0–10.5)

## 2015-10-28 LAB — URINE MICROSCOPIC-ADD ON: RBC / HPF: NONE SEEN RBC/hpf (ref 0–5)

## 2015-10-28 LAB — SEDIMENTATION RATE: SED RATE: 8 mm/h (ref 0–22)

## 2015-10-28 MED ORDER — DEXAMETHASONE SODIUM PHOSPHATE 10 MG/ML IJ SOLN
10.0000 mg | Freq: Once | INTRAMUSCULAR | Status: AC
  Administered 2015-10-28: 10 mg via INTRAVENOUS
  Filled 2015-10-28: qty 1

## 2015-10-28 MED ORDER — BUTALBITAL-APAP-CAFFEINE 50-325-40 MG PO TABS: 1.0000 | ORAL_TABLET | Freq: Four times a day (QID) | ORAL | Status: DC | PRN

## 2015-10-28 MED ORDER — SODIUM CHLORIDE 0.9 % IV BOLUS (SEPSIS)
500.0000 mL | Freq: Once | INTRAVENOUS | Status: AC
  Administered 2015-10-28: 500 mL via INTRAVENOUS

## 2015-10-28 MED ORDER — METOCLOPRAMIDE HCL 5 MG/ML IJ SOLN
10.0000 mg | Freq: Once | INTRAMUSCULAR | Status: AC
  Administered 2015-10-28: 10 mg via INTRAVENOUS
  Filled 2015-10-28: qty 2

## 2015-10-28 MED ORDER — DIPHENHYDRAMINE HCL 50 MG/ML IJ SOLN
25.0000 mg | Freq: Once | INTRAMUSCULAR | Status: AC
  Administered 2015-10-28: 25 mg via INTRAVENOUS
  Filled 2015-10-28: qty 1

## 2015-10-28 NOTE — ED Provider Notes (Signed)
CSN: [id number]     Arrival date & time 10/28/15  1334 History   First MD Initiated Contact with Patient 10/28/15 1419     Chief Complaint  Patient presents with  . Headache     (Consider location/radiation/quality/duration/timing/severity/associated sxs/prior Treatment) HPI Comments: Patient presents with a headache. She has a history of diabetes, hypertension, panic attacks and prior breast cancer status post chemotherapy and radiation therapy. She reports a 3 to four-day history of worsening left-sided headache. She says it's in her left forehead. It radiates down her left face and the left side of her neck. She has some tingling in her face. She denies any numbness or weakness to her extremities. She denies any balance problems. She states over last 2 weeks she's been feeling not quite herself. She's had some intermittent nausea and intermittent dizziness. She says her blood pressures been a little. She denies any chest pain or shortness of breath. She denies any speech deficits. She states her vision has been intermittently blurry. She saw her PCP 2 days ago who started her on a medication for her headaches. She states she can take it twice a day but it hasn't been helping her headache. She also increased her blood pressure medicine. She has had headaches in the past but says never a similar headache to this.  Patient is a 45 y.o. female presenting with headaches.  Headache Associated symptoms: dizziness, fatigue, nausea and numbness   Associated symptoms: no abdominal pain, no back pain, no congestion, no cough, no diarrhea, no fever, no vomiting and no weakness     Past Medical History  Diagnosis Date  . Diabetes mellitus without complication ([place])   . Arthritis   . Hypertension   . Malignant neoplasm of 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 ([place]) 05/21/2011    Left breast, 2 foci: 1.2 and 1.5 cm, histologic grade 3, ER 1-5%; PR 1-5%, HER-2/neu not overexpressed.  . Sleep apnea   .  Panic attacks   . Breast cancer ([place])     left breast - chemo and radiation   Past Surgical History  Procedure Laterality Date  . Breast lumpectomy  2013  . Breast reduction surgery  2005  . Oophorectomy Right 2013  . Breast mammosite Left February 2013  . Colonoscopy with propofol N/A 01/09/2015    Procedure: COLONOSCOPY WITH PROPOFOL;  Surgeon: Lucilla Lame, MD;  Location: [facility] ENDOSCOPY;  Service: Endoscopy;  Laterality: N/A;  . Esophagogastroduodenoscopy (egd) with propofol N/A 01/09/2015    Procedure: ESOPHAGOGASTRODUODENOSCOPY (EGD) WITH PROPOFOL;  Surgeon: Lucilla Lame, MD;  Location: [facility] ENDOSCOPY;  Service: Endoscopy;  Laterality: N/A;  . Reduction mammaplasty     Family History  Problem Relation Age of Onset  . Hypertension Mother   . Heart disease Mother   . Diabetes Mother   . Breast cancer Cousin    Social History  Substance Use Topics  . Smoking status: Former Smoker -- 26 years  . Smokeless tobacco: Never Used  . Alcohol Use: Yes     Comment: occasionally   OB History    Gravida Para Term Preterm AB TAB SAB Ectopic Multiple Living   '7 6   1  1   6      '$ Obstetric Comments   1st Menstrual Cycle:  12  1st Pregnancy:  16     Review of Systems  Constitutional: Positive for fatigue. Negative for fever, chills and diaphoresis.  HENT: Negative for congestion, rhinorrhea and sneezing.   Eyes: Negative.  Respiratory: Negative for cough, chest tightness and shortness of breath.   Cardiovascular: Negative for chest pain and leg swelling.  Gastrointestinal: Positive for nausea. Negative for vomiting, abdominal pain, diarrhea and blood in stool.  Genitourinary: Negative for frequency, hematuria, flank pain and difficulty urinating.  Musculoskeletal: Negative for back pain and arthralgias.  Skin: Negative for rash.  Neurological: Positive for dizziness, numbness and headaches. Negative for speech difficulty and weakness.      Allergies  Review of patient's allergies  indicates no known allergies.  Home Medications   Prior to Admission medications   Medication Sig Start Date End Date Taking? Authorizing Provider  ciprofloxacin (CIPRO) 500 MG tablet Take 500 mg by mouth 2 (two) times daily.   Yes Historical Provider, MD  omeprazole (PRILOSEC) 10 MG capsule Take 10 mg by mouth daily.   Yes Historical Provider, MD  ALPRAZolam Duanne Moron) 1 MG tablet Take 1 mg by mouth 2 (two) times daily.  01/16/14   Historical Provider, MD  busPIRone (BUSPAR) 5 MG tablet TK 1 T PO BID. DO NOT DRIVE OR OPERATE MACHINERY 05/15/15   Historical Provider, MD  Furosemide (LASIX PO) Take 1 tablet by mouth daily.    Historical Provider, MD  Ginkgo Biloba (GINKOBA PO) Take 25 mg by mouth daily.    Historical Provider, MD  lactulose [facility]) 10 GM/15ML solution Reported on 06/04/2015 11/03/14   Historical Provider, MD  losartan (COZAAR) 25 MG tablet Take 25 mg by mouth daily.    Historical Provider, MD  Melatonin 10 MG TBDP Take by mouth.    Historical Provider, MD  metFORMIN (GLUCOPHAGE) 500 MG tablet Take 500 mg by mouth daily with breakfast.  10/23/13   Historical Provider, MD  methylPREDNISolone (MEDROL DOSEPAK) 4 MG TBPK tablet Follow directions as printed on medication pack. 02/05/15   Lucilla Lame, MD  ondansetron (ZOFRAN ODT) 4 MG disintegrating tablet Take 1 tablet (4 mg total) by mouth every 8 (eight) hours as needed for nausea or vomiting. 12/07/14   Lucilla Lame, MD  POTASSIUM PO Take 1 tablet by mouth 2 (two) times daily.    Historical Provider, MD  tamoxifen (NOLVADEX) 20 MG tablet Take 1 tablet (20 mg total) by mouth daily. 07/16/15   Forest Gleason, MD  venlafaxine XR (EFFEXOR-XR) 150 MG 24 hr capsule Take 150 mg by mouth daily with breakfast.  01/11/14   Historical Provider, MD   BP 135/77 mmHg  Pulse 77  Temp(Src) 98.1 F (36.7 C) (Oral)  Resp 18  Ht '5\' 7"'$  (1.702 m)  Wt 320 lb (145.151 kg)  BMI 50.11 kg/m2  SpO2 100% Physical Exam  Constitutional: She is oriented to person,  place, and time. She appears well-developed and well-nourished.  HENT:  Head: Normocephalic and atraumatic.  Positive tenderness to the left side of face including over the temporal artery. There is no pain to the jaw. Any dizziness. No pain on palpation of the neck. She states the pain ends at the base of her skull.  Eyes: Pupils are equal, round, and reactive to light.  Neck: Normal range of motion. Neck supple.  Cardiovascular: Normal rate, regular rhythm and normal heart sounds.   Pulmonary/Chest: Effort normal and breath sounds normal. No respiratory distress. She has no wheezes. She has no rales. She exhibits no tenderness.  Abdominal: Soft. Bowel sounds are normal. There is no tenderness. There is no rebound and no guarding.  Musculoskeletal: Normal range of motion. She exhibits no edema.  Lymphadenopathy:    She has  no cervical adenopathy.  Neurological: She is alert and oriented to person, place, and time.  Patient has some subjective numbness to light touch to the left side her face including the forehead. Other cranial nerves are intact. Motor 5 out of 5 all extremities, sensation grossly intact to light touch all extremities, gait normal, finger to nose intact, no pronator drift  Skin: Skin is warm and dry. No rash noted.  Psychiatric: She has a normal mood and affect.    ED Course  Procedures (including critical care time) Labs Review Labs Reviewed  BASIC METABOLIC PANEL - Abnormal; Notable for the following:    Glucose, Bld 111 (*)    Calcium 8.4 (*)    All other components within normal limits  URINALYSIS, ROUTINE W REFLEX MICROSCOPIC (NOT AT [facility]) - Abnormal; Notable for the following:    APPearance CLOUDY (*)    Leukocytes, UA LARGE (*)    All other components within normal limits  URINE MICROSCOPIC-ADD ON - Abnormal; Notable for the following:    Squamous Epithelial / LPF 0-5 (*)    Bacteria, UA MANY (*)    All other components within normal limits  URINE CULTURE   CBC WITH DIFFERENTIAL/PLATELET  SEDIMENTATION RATE    Imaging Review Ct Head Wo Contrast  10/28/2015  CLINICAL DATA:  Headache for 3 days. Nausea and vomiting, dizziness, blurred vision. History of hypertension. History of breast cancer. EXAM: CT HEAD WITHOUT CONTRAST TECHNIQUE: Contiguous axial images were obtained from the base of the skull through the vertex without intravenous contrast. COMPARISON:  Head CT dated 12/18/2012. FINDINGS: Brain: Ventricles are normal in size and configuration. All areas of the brain demonstrate normal gray-white matter attenuation. There is no mass, hemorrhage, edema or other evidence of acute parenchymal abnormality. No extra-axial hemorrhage. Vascular: No hyperdense vessel or unexpected calcification. Skull: Negative for fracture or focal lesion. Sinuses/Orbits: No acute findings. Other: None. IMPRESSION: Normal head CT. Electronically Signed   By: Franki Cabot M.D.   On: 10/28/2015 15:00   I have personally reviewed and evaluated these images and lab results as part of my medical decision-making.   EKG Interpretation None      MDM   Final diagnoses:  Nonintractable paroxysmal hemicrania, unspecified chronicity pattern    Patient presents with a headache. It's unilateral. She has remote history of breast cancer. Given this I did do a head CT which is negative. She reports some numbness to the left side of her face but has no other neurologic deficits. She was given a migraine cocktail and her headaches feeling better. She states the numbness has improved. She does have tenderness over the temporal artery. Given this, I did check a sedimentation rate which is currently pending. Her other labs are unremarkable. Her urine does have signs of infection but she's only taken 24 hours worth of antibiotics. She's currently on Cipro. I advised her to continue the Cipro in her urine was sent for culture.  ESR pending.  Dr. Regenia Skeeter to follow  Malvin Johns,  MD 10/28/15 [phone]

## 2015-10-28 NOTE — ED Provider Notes (Signed)
ESR normal. Headache is much improved. Will give fioricet Rx and recommend PCP f/u. Discussed return precautions.  Sherwood Gambler, MD 10/28/15 [phone]

## 2015-10-28 NOTE — ED Notes (Signed)
Patient reports that she has had HTN since Friday with a headache. Reports that she is also having tingling to her head where the headache is. Patient reports that she is supposed to be taking medications for a Bladder infection but is unable r/t the nausea with the Headache

## 2015-10-28 NOTE — ED Notes (Signed)
MD at bedside. 

## 2015-10-30 LAB — URINE CULTURE: Culture: <10000 — AB

## 2015-11-07 ENCOUNTER — Other Ambulatory Visit: Payer: Self-pay | Admitting: Oncology

## 2015-11-12 ENCOUNTER — Encounter (HOSPITAL_BASED_OUTPATIENT_CLINIC_OR_DEPARTMENT_OTHER): Payer: Self-pay | Admitting: Emergency Medicine

## 2015-11-12 ENCOUNTER — Emergency Department (HOSPITAL_BASED_OUTPATIENT_CLINIC_OR_DEPARTMENT_OTHER)
Admission: EM | Admit: 2015-11-12 | Discharge: 2015-11-12 | Disposition: A | Discharge status: Home / Self Care | Payer: Medicare Other | Attending: Emergency Medicine | Admitting: Emergency Medicine

## 2015-11-12 DIAGNOSIS — Z853 Personal history of malignant neoplasm of breast: Secondary | ICD-10-CM | POA: Insufficient documentation

## 2015-11-12 DIAGNOSIS — E860 Dehydration: Secondary | ICD-10-CM | POA: Diagnosis not present

## 2015-11-12 DIAGNOSIS — I10 Essential (primary) hypertension: Secondary | ICD-10-CM | POA: Insufficient documentation

## 2015-11-12 DIAGNOSIS — Z87891 Personal history of nicotine dependence: Secondary | ICD-10-CM | POA: Diagnosis not present

## 2015-11-12 DIAGNOSIS — Z7984 Long term (current) use of oral hypoglycemic drugs: Secondary | ICD-10-CM | POA: Insufficient documentation

## 2015-11-12 DIAGNOSIS — E119 Type 2 diabetes mellitus without complications: Secondary | ICD-10-CM | POA: Insufficient documentation

## 2015-11-12 DIAGNOSIS — Z79899 Other long term (current) drug therapy: Secondary | ICD-10-CM | POA: Diagnosis not present

## 2015-11-12 DIAGNOSIS — M25512 Pain in left shoulder: Secondary | ICD-10-CM | POA: Diagnosis present

## 2015-11-12 DIAGNOSIS — M62838 Other muscle spasm: Secondary | ICD-10-CM | POA: Insufficient documentation

## 2015-11-12 DIAGNOSIS — M1990 Unspecified osteoarthritis, unspecified site: Secondary | ICD-10-CM | POA: Diagnosis not present

## 2015-11-12 LAB — CBC
HEMATOCRIT: 40.7 % (ref 36.0–46.0)
HEMOGLOBIN: 13.9 g/dL (ref 12.0–15.0)
MCH: 30 pg (ref 26.0–34.0)
MCHC: 34.2 g/dL (ref 30.0–36.0)
MCV: 87.7 fL (ref 78.0–100.0)
Platelets: 307 10*3/uL (ref 150–400)
RBC: 4.64 MIL/uL (ref 3.87–5.11)
RDW: 13.1 % (ref 11.5–15.5)
WBC: 11.1 10*3/uL — AB (ref 4.0–10.5)

## 2015-11-12 LAB — BASIC METABOLIC PANEL
ANION GAP: 12 (ref 5–15)
BUN: 30 mg/dL — ABNORMAL HIGH (ref 6–20)
CALCIUM: 9.4 mg/dL (ref 8.9–10.3)
CHLORIDE: 100 mmol/L — AB (ref 101–111)
CO2: 26 mmol/L (ref 22–32)
Creatinine, Ser: 1.26 mg/dL — ABNORMAL HIGH (ref 0.44–1.00)
GFR calc non Af Amer: 50 mL/min — ABNORMAL LOW (ref >60)
GFR, EST AFRICAN AMERICAN: 58 mL/min — AB (ref >60)
Glucose, Bld: 95 mg/dL (ref 65–99)
POTASSIUM: 3.8 mmol/L (ref 3.5–5.1)
Sodium: 138 mmol/L (ref 135–145)

## 2015-11-12 LAB — CBG MONITORING, ED: GLUCOSE-CAPILLARY: 93 mg/dL (ref 65–99)

## 2015-11-12 MED ORDER — DIAZEPAM 5 MG/ML IJ SOLN
5.0000 mg | Freq: Once | INTRAMUSCULAR | Status: AC
  Administered 2015-11-12: 5 mg via INTRAVENOUS
  Filled 2015-11-12: qty 2

## 2015-11-12 MED ORDER — DIAZEPAM 5 MG PO TABS: ORAL_TABLET | ORAL | Status: DC

## 2015-11-12 MED ORDER — FENTANYL CITRATE (PF) 100 MCG/2ML IJ SOLN
100.0000 ug | Freq: Once | INTRAMUSCULAR | Status: AC
  Administered 2015-11-12: 100 ug via INTRAVENOUS
  Filled 2015-11-12: qty 2

## 2015-11-12 MED ORDER — SODIUM CHLORIDE 0.9 % IV BOLUS (SEPSIS)
1000.0000 mL | Freq: Once | INTRAVENOUS | Status: AC
  Administered 2015-11-12: 1000 mL via INTRAVENOUS

## 2015-11-12 MED FILL — diazePAM 5 MG TABS: 5 | 4 days supply | Qty: 8 | Fill #0

## 2015-11-12 NOTE — Discharge Instructions (Signed)
Please read and follow all provided instructions.  Your diagnoses today include:  1. Muscle spasm   2. Dehydration     Tests performed today include:  Blood counts and electrolytes - shows mild dehydration  Vital signs. See below for your results today.   Medications prescribed:   Valium - muscle relaxer medication  DO NOT drive or perform any activities that require you to be awake and alert because this medicine can make you drowsy.   Take any prescribed medications only as directed.  Home care instructions:  Follow any educational materials contained in this packet.  Follow-up instructions: Please follow-up with your primary care provider in the next 3 days for further evaluation of your symptoms.   Return instructions:   Please return to the Emergency Department if you experience worsening symptoms.   Please return if you have any other emergent concerns.  Additional Information:  Your vital signs today were: BP 103/88 mmHg   Pulse 85   Temp(Src) 98.1 F (36.7 C) (Oral)   Resp 16   Ht 5\' 7"  (1.702 m)   Wt 145.151 kg   BMI 50.11 kg/m2   SpO2 99% If your blood pressure (BP) was elevated above 135/85 this visit, please have this repeated by your doctor within one month. --------------

## 2015-11-12 NOTE — ED Notes (Signed)
Pt requesting a CBG to be done.

## 2015-11-12 NOTE — ED Notes (Signed)
Pt reports muscle spasms in the left neck, bilateral arms and back for about a month. Reports nausea due to pain has taken [facility] powder no relief. Denies Heart Hx other than HTN.

## 2015-11-12 NOTE — ED Provider Notes (Signed)
CSN: [id number]     Arrival date & time 11/12/15  1059 History   First MD Initiated Contact with Patient 11/12/15 1147     Chief Complaint  Patient presents with  . Muscle Pain     (Consider location/radiation/quality/duration/timing/severity/associated sxs/prior Treatment) HPI Comments: Patient with history of breast cancer, currently on tamoxifen, history of diabetes, hypertension, anxiety, presented to the emergency department for headache on 10/28/2015 and had a normal head CT and reassuring lab work -- presents with one month history of muscle spasms in her left neck, shoulder, upper arm, and left lateral chest. Patient states that this is been waxing and waning over the past month. Today, symptoms are severe. She denies injury. No fevers, nausea, vomiting, diarrhea. No other chest pain or shortness of breath. Patient describes the intermittent spasms as a "catching sensation". She has taken ibuprofen and BC powder without relief. The onset of this condition was acute. The course is constant. Aggravating factors: none. Alleviating factors: none.    The history is provided by the patient.    Past Medical History  Diagnosis Date  . Diabetes mellitus without complication ([place])   . Arthritis   . Hypertension   . Malignant neoplasm of 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 ([place]) 05/21/2011    Left breast, 2 foci: 1.2 and 1.5 cm, histologic grade 3, ER 1-5%; PR 1-5%, HER-2/neu not overexpressed.  . Sleep apnea   . Panic attacks   . Breast cancer ([place])     left breast - chemo and radiation   Past Surgical History  Procedure Laterality Date  . Breast lumpectomy  2013  . Breast reduction surgery  2005  . Oophorectomy Right 2013  . Breast mammosite Left February 2013  . Colonoscopy with propofol N/A 01/09/2015    Procedure: COLONOSCOPY WITH PROPOFOL;  Surgeon: Lucilla Lame, MD;  Location: [facility] ENDOSCOPY;  Service: Endoscopy;  Laterality: N/A;  . Esophagogastroduodenoscopy (egd) with  propofol N/A 01/09/2015    Procedure: ESOPHAGOGASTRODUODENOSCOPY (EGD) WITH PROPOFOL;  Surgeon: Lucilla Lame, MD;  Location: [facility] ENDOSCOPY;  Service: Endoscopy;  Laterality: N/A;  . Reduction mammaplasty     Family History  Problem Relation Age of Onset  . Hypertension Mother   . Heart disease Mother   . Diabetes Mother   . Breast cancer Cousin    Social History  Substance Use Topics  . Smoking status: Former Smoker -- 26 years  . Smokeless tobacco: Never Used  . Alcohol Use: Yes     Comment: occasionally   OB History    Gravida Para Term Preterm AB TAB SAB Ectopic Multiple Living   '7 6   1  1   6      '$ Obstetric Comments   1st Menstrual Cycle:  12  1st Pregnancy:  16     Review of Systems  Constitutional: Negative for fever.  HENT: Negative for rhinorrhea and sore throat.   Eyes: Negative for redness.  Respiratory: Negative for cough.   Cardiovascular: Negative for chest pain.  Gastrointestinal: Negative for nausea, vomiting, abdominal pain and diarrhea.  Genitourinary: Negative for dysuria.  Musculoskeletal: Positive for myalgias, neck pain and neck stiffness. Negative for arthralgias.  Skin: Negative for rash.  Neurological: Negative for headaches.    Allergies  Review of patient's allergies indicates no known allergies.  Home Medications   Prior to Admission medications   Medication Sig Start Date End Date Taking? Authorizing Provider  busPIRone (BUSPAR) 5 MG tablet TK 1 T PO BID. DO NOT DRIVE  OR OPERATE MACHINERY 05/15/15  Yes Historical Provider, MD  Furosemide (LASIX PO) Take 1 tablet by mouth daily.   Yes Historical Provider, MD  Ginkgo Biloba (GINKOBA PO) Take 25 mg by mouth daily.   Yes Historical Provider, MD  losartan (COZAAR) 25 MG tablet Take 25 mg by mouth daily.   Yes Historical Provider, MD  metFORMIN (GLUCOPHAGE) 500 MG tablet Take 500 mg by mouth daily with breakfast.  10/23/13  Yes Historical Provider, MD  omeprazole (PRILOSEC) 10 MG capsule Take 10  mg by mouth daily.   Yes Historical Provider, MD  POTASSIUM PO Take 1 tablet by mouth 2 (two) times daily.   Yes Historical Provider, MD  venlafaxine XR (EFFEXOR-XR) 150 MG 24 hr capsule Take 150 mg by mouth daily with breakfast.  01/11/14  Yes Historical Provider, MD  ALPRAZolam Duanne Moron) 1 MG tablet Take 1 mg by mouth 2 (two) times daily.  01/16/14   Historical Provider, MD  butalbital-acetaminophen-caffeine (FIORICET) 50-325-40 MG tablet Take 1-2 tablets by mouth every 6 (six) hours as needed for headache. 10/28/15 10/27/16  Sherwood Gambler, MD  ciprofloxacin (CIPRO) 500 MG tablet Take 500 mg by mouth 2 (two) times daily.    Historical Provider, MD  lactulose (CHRONULAC) 10 GM/15ML solution TAKE 15 ML BY MOUTH TWICE DAILY AS NEEDED IF NO BOWEL MOVEMENT IN 24 HOURS, STOP IF BOWEL MOVEMENT OCCURS 11/07/15   Cammie Sickle, MD  Melatonin 10 MG TBDP Take by mouth.    Historical Provider, MD  methylPREDNISolone (MEDROL DOSEPAK) 4 MG TBPK tablet Follow directions as printed on medication pack. 02/05/15   Lucilla Lame, MD  ondansetron (ZOFRAN ODT) 4 MG disintegrating tablet Take 1 tablet (4 mg total) by mouth every 8 (eight) hours as needed for nausea or vomiting. 12/07/14   Lucilla Lame, MD  tamoxifen (NOLVADEX) 20 MG tablet Take 1 tablet (20 mg total) by mouth daily. 07/16/15   Forest Gleason, MD   BP 120/78 mmHg  Pulse 104  Temp(Src) 98.1 F (36.7 C) (Oral)  Resp 22  Ht '5\' 7"'$  (1.702 m)  Wt 145.151 kg  BMI 50.11 kg/m2  SpO2 100%   Physical Exam  Constitutional: She appears well-developed and well-nourished. She appears distressed.  Patient has intermittent severe pain due to apparent spasms. Uncomfortable appearing.  HENT:  Head: Normocephalic and atraumatic.  Eyes: Conjunctivae are normal. Right eye exhibits no discharge. Left eye exhibits no discharge.  Neck: Normal range of motion. Neck supple.  Cardiovascular: Normal rate, regular rhythm and normal heart sounds.   Pulmonary/Chest: Effort normal  and breath sounds normal.  Abdominal: Soft. There is no tenderness.  Musculoskeletal: She exhibits tenderness.  Patient with generalized tenderness to palpation of the left paraspinous musculature, left posterior shoulder, left superior lateral chest wall.  Neurological: She is alert.  Skin: Skin is warm and dry.  Psychiatric: She has a normal mood and affect.  Nursing note and vitals reviewed.   ED Course  Procedures (including critical care time) Labs Review Labs Reviewed  CBC - Abnormal; Notable for the following:    WBC 11.1 (*)    All other components within normal limits  BASIC METABOLIC PANEL - Abnormal; Notable for the following:    Chloride 100 (*)    BUN 30 (*)    Creatinine, Ser 1.26 (*)    GFR calc non Af Amer 50 (*)    GFR calc Af Amer 58 (*)    All other components within normal limits  CBG MONITORING, ED  12:41 PM Patient seen and examined. Work-up initiated. Medications ordered.   Vital signs reviewed and are as follows: BP 120/78 mmHg  Pulse 104  Temp(Src) 98.1 F (36.7 C) (Oral)  Resp 22  Ht '5\' 7"'$  (1.702 m)  Wt 145.151 kg  BMI 50.11 kg/m2  SpO2 100%  1:27 PM Labs suggest some dehydration. Fluids ordered. Pain still present after valium. Fentanyl ordered.   2:58 PM patient feeling somewhat better. Will discharge to home after remainder of fluid bolus with oral Valium for muscle spasms.  Patient counseled on proper use of muscle relaxant medication.  They were told not to drink alcohol, drive any vehicle, or do any dangerous activities while taking this medication.  Patient verbalized understanding.   MDM   Final diagnoses:  Muscle spasm  Dehydration   Muscle spasms: Ongoing for 1 month unclear etiology. Improved with treatment. Electrolytes are okay. No concern for rhabdo. No antipsychotics. Unclear if related to Tamoxifen. Will defer to her PCP/specialists.   Dehydration: slightly elevated BUN/creatinine. Treated with fluids.    Carlisle Cater, PA-C 11/12/15 [place], DO 11/12/15 1523

## 2015-11-19 ENCOUNTER — Telehealth: Payer: Self-pay | Admitting: *Deleted

## 2015-11-19 ENCOUNTER — Other Ambulatory Visit: Payer: Self-pay | Admitting: Internal Medicine

## 2015-11-19 NOTE — Telephone Encounter (Signed)
Called to report that she went to ED over weekend for joint pain and muscle spasms. ASking we call her with an appt. She has not been assigned to a new provider yet. Please advise

## 2015-11-20 NOTE — Telephone Encounter (Signed)
appt moved to 7/24 per VO Dr Grayland Ormond

## 2015-11-20 NOTE — Telephone Encounter (Signed)
Patient has called again this morning asking for something to be done regarding her symptoms

## 2015-11-26 ENCOUNTER — Inpatient Hospital Stay: Payer: Medicare Other

## 2015-11-26 ENCOUNTER — Inpatient Hospital Stay: Payer: Medicare Other | Attending: Oncology | Admitting: Oncology

## 2015-11-26 VITALS — BP 143/101 | HR 81 | Temp 97.1°F | Resp 18 | Wt 319.4 lb

## 2015-11-26 DIAGNOSIS — Z7984 Long term (current) use of oral hypoglycemic drugs: Secondary | ICD-10-CM | POA: Diagnosis not present

## 2015-11-26 DIAGNOSIS — Z170 Estrogen receptor positive status [ER+]: Secondary | ICD-10-CM | POA: Insufficient documentation

## 2015-11-26 DIAGNOSIS — Z9221 Personal history of antineoplastic chemotherapy: Secondary | ICD-10-CM | POA: Diagnosis not present

## 2015-11-26 DIAGNOSIS — C50412 Malignant neoplasm of upper-outer quadrant of left female breast: Secondary | ICD-10-CM

## 2015-11-26 DIAGNOSIS — Z923 Personal history of irradiation: Secondary | ICD-10-CM | POA: Insufficient documentation

## 2015-11-26 DIAGNOSIS — Z79899 Other long term (current) drug therapy: Secondary | ICD-10-CM | POA: Diagnosis not present

## 2015-11-26 DIAGNOSIS — Z87891 Personal history of nicotine dependence: Secondary | ICD-10-CM | POA: Insufficient documentation

## 2015-11-26 DIAGNOSIS — C50419 Malignant neoplasm of upper-outer quadrant of unspecified female breast: Secondary | ICD-10-CM

## 2015-11-26 DIAGNOSIS — I10 Essential (primary) hypertension: Secondary | ICD-10-CM | POA: Diagnosis not present

## 2015-11-26 DIAGNOSIS — F410 Panic disorder [episodic paroxysmal anxiety] without agoraphobia: Secondary | ICD-10-CM | POA: Diagnosis not present

## 2015-11-26 DIAGNOSIS — M25552 Pain in left hip: Secondary | ICD-10-CM

## 2015-11-26 DIAGNOSIS — Z79810 Long term (current) use of selective estrogen receptor modulators (SERMs): Secondary | ICD-10-CM | POA: Diagnosis not present

## 2015-11-26 DIAGNOSIS — F419 Anxiety disorder, unspecified: Secondary | ICD-10-CM | POA: Diagnosis not present

## 2015-11-26 DIAGNOSIS — G4730 Sleep apnea, unspecified: Secondary | ICD-10-CM | POA: Diagnosis not present

## 2015-11-26 DIAGNOSIS — Z803 Family history of malignant neoplasm of breast: Secondary | ICD-10-CM | POA: Insufficient documentation

## 2015-11-26 DIAGNOSIS — M129 Arthropathy, unspecified: Secondary | ICD-10-CM | POA: Diagnosis not present

## 2015-11-26 DIAGNOSIS — R42 Dizziness and giddiness: Secondary | ICD-10-CM | POA: Diagnosis not present

## 2015-11-26 LAB — COMPREHENSIVE METABOLIC PANEL
ALBUMIN: 4 g/dL (ref 3.5–5.0)
ALT: 12 U/L — ABNORMAL LOW (ref 14–54)
ANION GAP: 8 (ref 5–15)
AST: 16 U/L (ref 15–41)
Alkaline Phosphatase: 49 U/L (ref 38–126)
BUN: 11 mg/dL (ref 6–20)
CO2: 27 mmol/L (ref 22–32)
Calcium: 8.5 mg/dL — ABNORMAL LOW (ref 8.9–10.3)
Chloride: 101 mmol/L (ref 101–111)
Creatinine, Ser: 0.72 mg/dL (ref 0.44–1.00)
GFR calc Af Amer: >60 mL/min (ref >60)
GFR calc non Af Amer: >60 mL/min (ref >60)
GLUCOSE: 118 mg/dL — AB (ref 65–99)
POTASSIUM: 3.7 mmol/L (ref 3.5–5.1)
SODIUM: 136 mmol/L (ref 135–145)
Total Bilirubin: 0.3 mg/dL (ref 0.3–1.2)
Total Protein: 7.4 g/dL (ref 6.5–8.1)

## 2015-11-26 LAB — CBC WITH DIFFERENTIAL/PLATELET
BASOS PCT: 1 %
Basophils Absolute: 0.1 10*3/uL (ref 0–0.1)
EOS ABS: 0.4 10*3/uL (ref 0–0.7)
EOS PCT: 5 %
HCT: 40 % (ref 35.0–47.0)
HEMOGLOBIN: 13.2 g/dL (ref 12.0–16.0)
Lymphocytes Relative: 33 %
Lymphs Abs: 2.6 10*3/uL (ref 1.0–3.6)
MCH: 29.3 pg (ref 26.0–34.0)
MCHC: 33.1 g/dL (ref 32.0–36.0)
MCV: 88.8 fL (ref 80.0–100.0)
MONO ABS: 0.5 10*3/uL (ref 0.2–0.9)
Monocytes Relative: 7 %
NEUTROS PCT: 56 %
Neutro Abs: 4.5 10*3/uL (ref 1.4–6.5)
PLATELETS: 293 10*3/uL (ref 150–440)
RBC: 4.5 MIL/uL (ref 3.80–5.20)
RDW: 14.4 % (ref 11.5–14.5)
WBC: 8 10*3/uL (ref 3.6–11.0)

## 2015-11-26 NOTE — Progress Notes (Signed)
Having dizziness, joint pain that pt thinks is related to taking tamoxifen. Pt currently still taking tamoxifen.

## 2015-11-29 NOTE — Progress Notes (Signed)
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 Patient appeared to have 2 adjacent primaries.  INTERVAL HISTORY: Patient returns to clinic today as an add-on with complaints of increased muscle cramping and dizziness which she believes is related to tamoxifen. She is recently evaluated in the ER and no obvious etiology was determined. She otherwise feels well and is asymptomatic. She has no other neurologic complaints. She denies any recent fevers. She has no chest pain or shortness of breath. She has a good appetite and denies weight loss. She denies any nausea, vomiting, constipation, or diarrhea. She has no urinary complaints. Patient otherwise feels well and offers no further specific complaints.  REVIEW OF SYSTEMS:   Review of Systems  Constitutional: Negative.  Negative for fever, malaise/fatigue and weight loss.  Respiratory: Negative.  Negative for cough and shortness of breath.   Cardiovascular: Negative.  Negative for chest pain.  Gastrointestinal: Negative.  Negative for abdominal pain.  Musculoskeletal:       Muscle cramping  Neurological: Positive for dizziness. Negative for weakness.  Psychiatric/Behavioral: The patient is nervous/anxious.     As per HPI. Otherwise, a complete review of systems is negatve.  PAST MEDICAL HISTORY: Past Medical History:  Diagnosis Date  . Arthritis   . Breast cancer ([place])    left breast - chemo and radiation  . Diabetes mellitus without complication ([place])   . Hypertension   . Malignant  neoplasm of 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 ([place]) 05/21/2011   Left breast, 2 foci: 1.2 and 1.5 cm, histologic grade 3, ER 1-5%; PR 1-5%, HER-2/neu not overexpressed.  . Panic attacks   . Sleep apnea     PAST SURGICAL HISTORY: Past Surgical History:  Procedure Laterality Date  . BREAST LUMPECTOMY  2013  . BREAST MAMMOSITE Left February 2013  . BREAST REDUCTION SURGERY  2005  . COLONOSCOPY WITH PROPOFOL N/A 01/09/2015   Procedure: COLONOSCOPY WITH PROPOFOL;  Surgeon: Lucilla Lame, MD;  Location: [facility] ENDOSCOPY;  Service: Endoscopy;  Laterality: N/A;  . ESOPHAGOGASTRODUODENOSCOPY (EGD) WITH PROPOFOL N/A 01/09/2015   Procedure: ESOPHAGOGASTRODUODENOSCOPY (EGD) WITH PROPOFOL;  Surgeon: Lucilla Lame, MD;  Location: [facility] ENDOSCOPY;  Service: Endoscopy;  Laterality: N/A;  . OOPHORECTOMY Right 2013  . REDUCTION MAMMAPLASTY      FAMILY HISTORY: Family History  Problem Relation Age of Onset  . Hypertension Mother   . Heart disease Mother   . Diabetes Mother   . Breast cancer Cousin        ADVANCED DIRECTIVES:    HEALTH MAINTENANCE: Social History  Substance Use Topics  . Smoking status: Former Smoker    Years: 26.00  . Smokeless tobacco: Never Used  . Alcohol use Yes     Comment: occasionally     Colonoscopy:  PAP:  Bone density:  Lipid panel:  No Known Allergies  Current Outpatient Prescriptions  Medication Sig Dispense Refill  . busPIRone (BUSPAR) 5 MG tablet TK 1 T PO BID. DO NOT DRIVE OR OPERATE MACHINERY  0  . diazepam (VALIUM) 5 MG tablet Take 1-2 tablets every 8 hours as needed for muscle spasm 8 tablet 0  .  Furosemide (LASIX PO) Take 1 tablet by mouth daily.    . Ginkgo Biloba (GINKOBA PO) Take 25 mg by mouth daily.    Marland Kitchen ibuprofen (ADVIL,MOTRIN) 800 MG tablet Take 800 mg by mouth every 6 (six) hours as needed.    . lactulose (CHRONULAC) 10 GM/15ML solution TAKE 15 ML BY MOUTH TWICE DAILY AS NEEDED IF NO BOWEL MOVEMENT IN 24 HOURS, STOP IF BOWEL MOVEMENT OCCURS  450 mL 0  . losartan (COZAAR) 25 MG tablet Take 25 mg by mouth daily.    . metFORMIN (GLUCOPHAGE) 500 MG tablet Take 500 mg by mouth daily with breakfast.     . omeprazole (PRILOSEC) 10 MG capsule Take 10 mg by mouth daily.    . ondansetron (ZOFRAN ODT) 4 MG disintegrating tablet Take 1 tablet (4 mg total) by mouth every 8 (eight) hours as needed for nausea or vomiting. 30 tablet 1  . POTASSIUM PO Take 1 tablet by mouth 2 (two) times daily.    . tamoxifen (NOLVADEX) 20 MG tablet Take 1 tablet (20 mg total) by mouth daily. 90 tablet 1  . venlafaxine XR (EFFEXOR-XR) 150 MG 24 hr capsule Take 150 mg by mouth daily with breakfast.      No current facility-administered medications for this visit.     OBJECTIVE: Vitals:   11/26/15 1548  BP: (!) 143/101  Pulse: 81  Resp: 18  Temp: 97.1 F (36.2 C)     Body mass index is 50.03 kg/m.    ECOG FS:0 - Asymptomatic  General: Well-developed, well-nourished, no acute distress. Eyes: Pink conjunctiva, anicteric sclera. Breasts: Exam deferred today. Lungs: Clear to auscultation bilaterally. Heart: Regular rate and rhythm. No rubs, murmurs, or gallops. Abdomen: Soft, nontender, nondistended. No organomegaly noted, normoactive bowel sounds. Musculoskeletal: No edema, cyanosis, or clubbing. Neuro: Alert, answering all questions appropriately. Cranial nerves grossly intact. Skin: No rashes or petechiae noted. Psych: Normal affect. Lymphatics: No cervical, calvicular, axillary or inguinal LAD.   LAB RESULTS:  Lab Results  Component Value Date   NA 136 11/26/2015   K 3.7 11/26/2015   CL 101 11/26/2015   CO2 27 11/26/2015   GLUCOSE 118 (H) 11/26/2015   BUN 11 11/26/2015   CREATININE 0.72 11/26/2015   CALCIUM 8.5 (L) 11/26/2015   PROT 7.4 11/26/2015   ALBUMIN 4.0 11/26/2015   AST 16 11/26/2015   ALT 12 (L) 11/26/2015   ALKPHOS 49 11/26/2015   BILITOT 0.3 11/26/2015   GFRNONAA >60 11/26/2015   GFRAA >60 11/26/2015    Lab Results    Component Value Date   WBC 8.0 11/26/2015   NEUTROABS 4.5 11/26/2015   HGB 13.2 11/26/2015   HCT 40.0 11/26/2015   MCV 88.8 11/26/2015   PLT 293 11/26/2015     STUDIES: No results found.  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 Patient appeared to have 2 adjacent primaries.  PLAN:    1. 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 Patient had lumpectomy and MammoSite treatment in February 2013. She also received adjuvant chemotherapy. She initiated tamoxifen in approximately July 2013. She will require to take this for 5 years completing in July 2018. Patient's most recent mammogram on Sep 05, 2015 was reported as BI-RADS 2. Repeat in May 2018. Return to clinic in 6 months  for routine evaluation. 2. Muscle cramping: Unlikely related to tamoxifen. Despite this, patient has been instructed to hold treatment for 1 month to see if her symptoms improve. 3. Dizziness: Unclear etiology. Possibly related to elevated blood pressure. Monitor. 4. Hypertension: Patient blood pressure is mildly elevated today, continue treatment per primary care.     Patient expressed understanding and was in agreement with this plan. She also understands that She can call clinic at any time with any questions, concerns, or complaints.    Lloyd Huger, MD   11/29/2015 9:33 AM

## 2015-12-03 ENCOUNTER — Other Ambulatory Visit: Payer: Medicare Other

## 2015-12-03 ENCOUNTER — Ambulatory Visit: Payer: Medicare Other | Admitting: Internal Medicine

## 2015-12-21 ENCOUNTER — Other Ambulatory Visit: Payer: Medicare Other

## 2015-12-21 ENCOUNTER — Ambulatory Visit: Payer: Medicare Other

## 2016-01-04 ENCOUNTER — Other Ambulatory Visit: Payer: Self-pay | Admitting: *Deleted

## 2016-01-04 MED ORDER — TAMOXIFEN CITRATE 20 MG PO TABS: 20.0000 mg | ORAL_TABLET | Freq: Every day | ORAL | 1 refills | Status: DC

## 2016-01-21 ENCOUNTER — Other Ambulatory Visit: Payer: Medicare Other

## 2016-01-21 ENCOUNTER — Ambulatory Visit: Payer: Medicare Other

## 2016-04-22 ENCOUNTER — Emergency Department (HOSPITAL_BASED_OUTPATIENT_CLINIC_OR_DEPARTMENT_OTHER)
Admission: EM | Admit: 2016-04-22 | Discharge: 2016-04-22 | Disposition: A | Discharge status: Home / Self Care | Payer: Medicare Other | Attending: Emergency Medicine | Admitting: Emergency Medicine

## 2016-04-22 ENCOUNTER — Encounter (HOSPITAL_BASED_OUTPATIENT_CLINIC_OR_DEPARTMENT_OTHER): Payer: Self-pay | Admitting: *Deleted

## 2016-04-22 DIAGNOSIS — Z7984 Long term (current) use of oral hypoglycemic drugs: Secondary | ICD-10-CM | POA: Insufficient documentation

## 2016-04-22 DIAGNOSIS — Z79899 Other long term (current) drug therapy: Secondary | ICD-10-CM | POA: Insufficient documentation

## 2016-04-22 DIAGNOSIS — I10 Essential (primary) hypertension: Secondary | ICD-10-CM | POA: Insufficient documentation

## 2016-04-22 DIAGNOSIS — Z853 Personal history of malignant neoplasm of breast: Secondary | ICD-10-CM | POA: Diagnosis not present

## 2016-04-22 DIAGNOSIS — H8301 Labyrinthitis, right ear: Secondary | ICD-10-CM | POA: Diagnosis not present

## 2016-04-22 DIAGNOSIS — M25561 Pain in right knee: Secondary | ICD-10-CM | POA: Insufficient documentation

## 2016-04-22 DIAGNOSIS — E119 Type 2 diabetes mellitus without complications: Secondary | ICD-10-CM | POA: Insufficient documentation

## 2016-04-22 DIAGNOSIS — R42 Dizziness and giddiness: Secondary | ICD-10-CM | POA: Diagnosis present

## 2016-04-22 LAB — CBC WITH DIFFERENTIAL/PLATELET
Basophils Absolute: 0 10*3/uL (ref 0.0–0.1)
Basophils Relative: 1 %
EOS ABS: 0.5 10*3/uL (ref 0.0–0.7)
EOS PCT: 6 %
HCT: 37.3 % (ref 36.0–46.0)
Hemoglobin: 12.1 g/dL (ref 12.0–15.0)
LYMPHS ABS: 2.8 10*3/uL (ref 0.7–4.0)
Lymphocytes Relative: 34 %
MCH: 29.8 pg (ref 26.0–34.0)
MCHC: 32.4 g/dL (ref 30.0–36.0)
MCV: 91.9 fL (ref 78.0–100.0)
MONO ABS: 0.6 10*3/uL (ref 0.1–1.0)
MONOS PCT: 7 %
Neutro Abs: 4.3 10*3/uL (ref 1.7–7.7)
Neutrophils Relative %: 52 %
PLATELETS: 300 10*3/uL (ref 150–400)
RBC: 4.06 MIL/uL (ref 3.87–5.11)
RDW: 13.6 % (ref 11.5–15.5)
WBC: 8.2 10*3/uL (ref 4.0–10.5)

## 2016-04-22 LAB — BASIC METABOLIC PANEL
Anion gap: 9 (ref 5–15)
BUN: 16 mg/dL (ref 6–20)
CO2: 30 mmol/L (ref 22–32)
CREATININE: 0.61 mg/dL (ref 0.44–1.00)
Calcium: 8.9 mg/dL (ref 8.9–10.3)
Chloride: 105 mmol/L (ref 101–111)
GFR calc Af Amer: >60 mL/min (ref >60)
GLUCOSE: 134 mg/dL — AB (ref 65–99)
Potassium: 4.2 mmol/L (ref 3.5–5.1)
SODIUM: 144 mmol/L (ref 135–145)

## 2016-04-22 MED ORDER — IBUPROFEN 800 MG PO TABS
800.0000 mg | ORAL_TABLET | Freq: Once | ORAL | Status: AC
  Administered 2016-04-22: 800 mg via ORAL
  Filled 2016-04-22: qty 1

## 2016-04-22 MED ORDER — DEXAMETHASONE 6 MG PO TABS
10.0000 mg | ORAL_TABLET | Freq: Once | ORAL | Status: AC
  Administered 2016-04-22: 10 mg via ORAL
  Filled 2016-04-22: qty 1

## 2016-04-22 MED ORDER — MECLIZINE HCL 25 MG PO TABS
25.0000 mg | ORAL_TABLET | Freq: Once | ORAL | Status: AC
  Administered 2016-04-22: 25 mg via ORAL
  Filled 2016-04-22: qty 1

## 2016-04-22 MED ORDER — MECLIZINE HCL 25 MG PO TABS: 25.0000 mg | ORAL_TABLET | Freq: Three times a day (TID) | ORAL | 0 refills | Status: DC | PRN

## 2016-04-22 MED ORDER — ACETAMINOPHEN 500 MG PO TABS
1000.0000 mg | ORAL_TABLET | Freq: Once | ORAL | Status: AC
  Administered 2016-04-22: 1000 mg via ORAL
  Filled 2016-04-22: qty 2

## 2016-04-22 MED FILL — MECLIZINE 25 MG TABLET: 25 | 10 days supply | Qty: 30 | Fill #0

## 2016-04-22 NOTE — Discharge Instructions (Signed)
Return for worsening, difficulty with walking, speech, or weakness to one side or the other.  Discuss your blood pressure with your PCP and see if they want to change your medications

## 2016-04-22 NOTE — ED Triage Notes (Signed)
Pt reports bilateral leg pain, increased when working.  States that she is also dizzy because of her HTN.

## 2016-04-22 NOTE — ED Provider Notes (Signed)
[place] [facility] DEPT [facility] Provider Note   CSN: [id number] Arrival date & time: 04/22/16  1415     History   Chief Complaint Chief Complaint  Patient presents with  . Dizziness    HPI Sherry Price is a 45 y.o. female.  45 yo F with a chief complaint of dizziness. She describes this as a lightheadedness and then further describes it is a bit of swelling in her head. She denies feeling like she is to pass out. Denies chest pain or shortness of breath. She has been having knee pain that is worse on movement and worse and of the day. Denies any lower extremity swelling. Denies history of PE or DVT. Patient has a complicated history with breast cancer on tamoxifen. She feels that the symptoms happened acutely when she turns her head or moves her eyes and then spontaneously resolves when she holds still. Denies any difficulty with gait other than her right knee pain.   The history is provided by the patient.  Dizziness  Quality:  Lightheadedness and head spinning Severity:  Moderate Onset quality:  Gradual Duration:  3 weeks Timing:  Intermittent Progression:  Worsening Chronicity:  New Context: eye movement and head movement   Relieved by:  Being still and closing eyes Worsened by:  Eye movement and turning head Ineffective treatments:  None tried Associated symptoms: no chest pain, no headaches, no nausea, no palpitations, no shortness of breath and no vomiting     Past Medical History:  Diagnosis Date  . Arthritis   . Breast cancer ([place])    left breast - chemo and radiation  . Diabetes mellitus without complication ([place])   . Hypertension   . Malignant neoplasm of 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 ([place]) 05/21/2011   Left breast, 2 foci: 1.2 and 1.5 cm, histologic grade 3, ER 1-5%; PR 1-5%, HER-2/neu not overexpressed.  . Panic attacks   . Sleep apnea     Patient Active Problem List   Diagnosis Date Noted  . Cough with expectoration 02/27/2015  . Benign  essential HTN 02/19/2015  . Heartburn   . Gastritis   . Change in bowel habits   . Benign neoplasm of ascending colon   . Anxiety 10/26/2013  . Affective bipolar disorder ([place]) 10/26/2013  . Essential (primary) hypertension 10/26/2013  . 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 Extreme obesity ([place]) 10/26/2013  . Bipolar affective disorder ([place]) 10/26/2013  . Malignant neoplasm of breast ([place]) 10/26/2013  . Morbid obesity ([place]) 10/26/2013  . Diabetes mellitus ([place]) 10/26/2013  . 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 Breast cancer of upper-outer quadrant of left female breast ([place]) 05/21/2011    Past Surgical History:  Procedure Laterality Date  . BREAST LUMPECTOMY  2013  . BREAST MAMMOSITE Left February 2013  . BREAST REDUCTION SURGERY  2005  . COLONOSCOPY WITH PROPOFOL N/A 01/09/2015   Procedure: COLONOSCOPY WITH PROPOFOL;  Surgeon: Lucilla Lame, MD;  Location: [facility] ENDOSCOPY;  Service: Endoscopy;  Laterality: N/A;  . ESOPHAGOGASTRODUODENOSCOPY (EGD) WITH PROPOFOL N/A 01/09/2015   Procedure: ESOPHAGOGASTRODUODENOSCOPY (EGD) WITH PROPOFOL;  Surgeon: Lucilla Lame, MD;  Location: [facility] ENDOSCOPY;  Service: Endoscopy;  Laterality: N/A;  . OOPHORECTOMY Right 2013  . REDUCTION MAMMAPLASTY      OB History    Gravida Para Term Preterm AB Living   '7 6     1 6   '$ SAB TAB Ectopic Multiple Live Births   1  Obstetric Comments   1st Menstrual Cycle:  12  1st Pregnancy:  16       Home Medications    Prior to Admission medications   Medication Sig Start Date End Date Taking? Authorizing Provider  clonazePAM (KLONOPIN) 0.5 MG tablet Take 0.5 mg by mouth 2 (two) times daily as needed for anxiety.   Yes Historical Provider, MD  DULoxetine (CYMBALTA) 20 MG capsule Take 20 mg by mouth daily.   Yes Historical Provider, MD  busPIRone (BUSPAR) 5 MG tablet TK 1 T PO BID. DO NOT DRIVE OR OPERATE MACHINERY 05/15/15   Historical Provider, MD  diazepam  (VALIUM) 5 MG tablet Take 1-2 tablets every 8 hours as needed for muscle spasm 11/12/15   Carlisle Cater, PA-C  Furosemide (LASIX PO) Take 1 tablet by mouth daily.    Historical Provider, MD  Ginkgo Biloba (GINKOBA PO) Take 25 mg by mouth daily.    Historical Provider, MD  ibuprofen (ADVIL,MOTRIN) 800 MG tablet Take 800 mg by mouth every 6 (six) hours as needed.    Historical Provider, MD  lactulose (CHRONULAC) 10 GM/15ML solution TAKE 15 ML BY MOUTH TWICE DAILY AS NEEDED IF NO BOWEL MOVEMENT IN 24 HOURS, STOP IF BOWEL MOVEMENT OCCURS 11/07/15   Cammie Sickle, MD  losartan (COZAAR) 25 MG tablet Take 25 mg by mouth daily.    Historical Provider, MD  meclizine (ANTIVERT) 25 MG tablet Take 1 tablet (25 mg total) by mouth 3 (three) times daily as needed. 04/22/16   Deno Etienne, DO  metFORMIN (GLUCOPHAGE) 500 MG tablet Take 500 mg by mouth daily with breakfast.  10/23/13   Historical Provider, MD  omeprazole (PRILOSEC) 10 MG capsule Take 10 mg by mouth daily.    Historical Provider, MD  ondansetron (ZOFRAN ODT) 4 MG disintegrating tablet Take 1 tablet (4 mg total) by mouth every 8 (eight) hours as needed for nausea or vomiting. 12/07/14   Lucilla Lame, MD  POTASSIUM PO Take 1 tablet by mouth 2 (two) times daily.    Historical Provider, MD  tamoxifen (NOLVADEX) 20 MG tablet Take 1 tablet (20 mg total) by mouth daily. 01/04/16   Lloyd Huger, MD  venlafaxine XR (EFFEXOR-XR) 150 MG 24 hr capsule Take 150 mg by mouth daily with breakfast.  01/11/14   Historical Provider, MD    Family History Family History  Problem Relation Age of Onset  . Hypertension Mother   . Heart disease Mother   . Diabetes Mother   . Breast cancer Cousin     Social History Social History  Substance Use Topics  . Smoking status: Former Smoker    Years: 26.00  . Smokeless tobacco: Never Used  . Alcohol use Yes     Comment: occasionally     Allergies   Patient has no known allergies.   Review of Systems Review of  Systems  Constitutional: Negative for chills and fever.  HENT: Negative for congestion and rhinorrhea.   Eyes: Negative for redness and visual disturbance.  Respiratory: Negative for shortness of breath and wheezing.   Cardiovascular: Negative for chest pain and palpitations.  Gastrointestinal: Negative for nausea and vomiting.  Genitourinary: Negative for dysuria and urgency.  Musculoskeletal: Negative for arthralgias and myalgias.  Skin: Negative for pallor and wound.  Neurological: Positive for dizziness. Negative for headaches.     Physical Exam Updated Vital Signs BP 154/96 (BP Location: Right Arm)   Pulse 96   Temp 98.2 F (36.8 C) (Oral)  Resp 18   Ht '5\' 6"'$  (1.676 m)   Wt (!) 330 lb (149.7 kg)   SpO2 99%   BMI 53.26 kg/m   Physical Exam  Constitutional: She is oriented to person, place, and time. She appears well-developed and well-nourished. No distress.  HENT:  Head: Normocephalic and atraumatic.  Swollen turbinates, posterior nasal drip, no noted sinus ttp, tm normal bilaterally.    Eyes: EOM are normal. Pupils are equal, round, and reactive to light.  Neck: Normal range of motion. Neck supple.  Cardiovascular: Normal rate and regular rhythm.  Exam reveals no gallop and no friction rub.   No murmur heard. Pulmonary/Chest: Effort normal. She has no wheezes. She has no rales.  Abdominal: Soft. She exhibits no distension. There is no tenderness.  Musculoskeletal: She exhibits no edema or tenderness.  Neurological: She is alert and oriented to person, place, and time. She has normal strength. No cranial nerve deficit or sensory deficit. She displays a negative Romberg sign. Coordination and gait normal. GCS eye subscore is 4. GCS verbal subscore is 5. GCS motor subscore is 6. She displays no Babinski's sign on the right side.  Reflex Scores:      Tricep reflexes are 2+ on the right side and 2+ on the left side.      Bicep reflexes are 2+ on the right side and 2+ on  the left side.      Brachioradialis reflexes are 2+ on the right side and 2+ on the left side.      Patellar reflexes are 2+ on the right side and 2+ on the left side.      Achilles reflexes are 2+ on the right side and 2+ on the left side. Right-sided fast going nystagmus  Skin: Skin is warm and dry. She is not diaphoretic.  Psychiatric: She has a normal mood and affect. Her behavior is normal.  Nursing note and vitals reviewed.    ED Treatments / Results  Labs (all labs ordered are listed, but only abnormal results are displayed) Labs Reviewed  BASIC METABOLIC PANEL - Abnormal; Notable for the following:       Result Value   Glucose, Bld 134 (*)    All other components within normal limits  CBC WITH DIFFERENTIAL/PLATELET    EKG  EKG Interpretation  Date/Time:  Tuesday April 22 2016 14:28:59 EST Ventricular Rate:  96 PR Interval:  156 QRS Duration: 86 QT Interval:  386 QTC Calculation: 487 R Axis:   36 Text Interpretation:  Normal sinus rhythm new  Prolonged QT Confirmed by Maryan Rued  MD, Loree Fee ([id number]) on 04/22/2016 2:37:21 PM       Radiology No results found.  Procedures Procedures (including critical care time)  Medications Ordered in ED Medications  acetaminophen (TYLENOL) tablet 1,000 mg (1,000 mg Oral Given 04/22/16 1535)  ibuprofen (ADVIL,MOTRIN) tablet 800 mg (800 mg Oral Given 04/22/16 1536)  meclizine (ANTIVERT) tablet 25 mg (25 mg Oral Given 04/22/16 1554)  dexamethasone (DECADRON) tablet 10 mg (10 mg Oral Given 04/22/16 1554)     Initial Impression / Assessment and Plan / ED Course  I have reviewed the triage vital signs and the nursing notes.  Pertinent labs & imaging results that were available during my care of the patient were reviewed by me and considered in my medical decision making (see chart for details).  Clinical Course     45 yo F With a chief complaint of dizziness. Sounds vertiginous by history. Re-created with extraocular  movement. Right-sided vascular nystagmus. Patient with a URI concurrently. Will treat for possible labyrinthitis. This patient has multiple other comorbidities that could be contributing will obtain basic labs. Patient is also concerned about her hypertension. Suggested that she follow-up with her family physician. Do not feel this is a posterior circulation stroke as patient is able to ambulate without difficulty and the symptoms are sudden in onset and offset.  Labs unremarkable.  D/c home.  PCP follow up.   4:23 PM:  I have discussed the diagnosis/risks/treatment options with the patient and family and believe the pt to be eligible for discharge home to follow-up with PCP, ortho. We also discussed returning to the ED immediately if new or worsening sx occur. We discussed the sx which are most concerning (e.g., sudden worsening pain, fever, inability to tolerate by mouth,inability to walk, other neuro symptoms) that necessitate immediate return. Medications administered to the patient during their visit and any new prescriptions provided to the patient are listed below.  Medications given during this visit Medications  acetaminophen (TYLENOL) tablet 1,000 mg (1,000 mg Oral Given 04/22/16 1535)  ibuprofen (ADVIL,MOTRIN) tablet 800 mg (800 mg Oral Given 04/22/16 1536)  meclizine (ANTIVERT) tablet 25 mg (25 mg Oral Given 04/22/16 1554)  dexamethasone (DECADRON) tablet 10 mg (10 mg Oral Given 04/22/16 1554)     The patient appears reasonably screen and/or stabilized for discharge and I doubt any other medical condition or other [facility] requiring further screening, evaluation, or treatment in the ED at this time prior to discharge.    Final Clinical Impressions(s) / ED Diagnoses   Final diagnoses:  Labyrinthitis of right ear  Hypertension, unspecified type  Acute pain of right knee    New Prescriptions New Prescriptions   MECLIZINE (ANTIVERT) 25 MG TABLET    Take 1 tablet (25 mg total) by mouth  3 (three) times daily as needed.     Deno Etienne, DO 04/22/16 1623

## 2016-05-27 NOTE — Progress Notes (Signed)
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 Patient appeared to have 2 adjacent primaries.  INTERVAL HISTORY: Patient returns to clinic today for six-month evaluation.  She complains of increased muscle cramping, reporting 5/10 pain in right knee, left lower arm, and left neck, unrelieved with tylenol and ibuprofen.  Her blood pressure is also elevated today at 167/109.  She also complains of nausea with head movement, and dizziness.  She reports increasing fatigue, weakness, and shortness of breath over the past 2 months, and recently had started taking cymbalta.  She reports feeling like giving up if she has to live with this pain indefinitely.  She reports having seen her PCP within the past 2 weeks, with another scheduled appointment on 06/17/16.  She states that the has been to the ER a couple of times related to the pain as well, with no determination of obvious etiology.  She otherwise feels well and is asymptomatic. She has no other neurologic complaints. She denies any recent fevers. She has no chest pain or shortness of breath. She has a good appetite and denies weight loss. She denies any nausea, vomiting, constipation, or diarrhea. She has no urinary complaints. Patient otherwise feels well and offers no further specific complaints.  REVIEW OF SYSTEMS:   Review of Systems  Constitutional: Positive for malaise/fatigue. Negative for fever and weight loss.  Respiratory: Positive for shortness of breath. Negative for cough.   Cardiovascular:  Negative.  Negative for chest pain.  Gastrointestinal: Positive for constipation and nausea. Negative for abdominal pain, diarrhea and vomiting.  Genitourinary: Negative.   Musculoskeletal:       Muscle cramping  Neurological: Positive for dizziness and weakness.  Psychiatric/Behavioral: The patient is nervous/anxious and has insomnia.     As per HPI. Otherwise, a complete review of systems is negative.  PAST MEDICAL HISTORY: Past Medical History:  Diagnosis Date  . Arthritis   . Breast cancer ([place])    left breast - chemo and radiation  . Diabetes mellitus without complication ([place])   . Hypertension   . Malignant neoplasm of 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 ([place]) 05/21/2011   Left breast, 2 foci: 1.2 and 1.5 cm, histologic grade 3, ER 1-5%; PR 1-5%, HER-2/neu not overexpressed.  . Panic attacks   . Sleep apnea     PAST SURGICAL HISTORY: Past Surgical History:  Procedure Laterality Date  . BREAST LUMPECTOMY  2013  . BREAST MAMMOSITE Left February 2013  . BREAST REDUCTION SURGERY  2005  . COLONOSCOPY WITH PROPOFOL N/A 01/09/2015   Procedure: COLONOSCOPY WITH PROPOFOL;  Surgeon: Lucilla Lame, MD;  Location: [facility] ENDOSCOPY;  Service: Endoscopy;  Laterality: N/A;  . ESOPHAGOGASTRODUODENOSCOPY (EGD) WITH PROPOFOL N/A 01/09/2015   Procedure: ESOPHAGOGASTRODUODENOSCOPY (EGD) WITH PROPOFOL;  Surgeon: Lucilla Lame, MD;  Location: [facility] ENDOSCOPY;  Service: Endoscopy;  Laterality: N/A;  . OOPHORECTOMY Right 2013  . REDUCTION MAMMAPLASTY      FAMILY HISTORY: Family History  Problem Relation Age of Onset  . Hypertension Mother   . Heart disease Mother   . Diabetes Mother   . Breast cancer Cousin  ADVANCED DIRECTIVES:    HEALTH MAINTENANCE: Social History  Substance Use Topics  . Smoking status: Former Smoker    Years: 26.00  . Smokeless tobacco: Never Used  . Alcohol use Yes     Comment: occasionally     Colonoscopy:  PAP:  Bone density:  Lipid panel:  No  Known Allergies  Current Outpatient Prescriptions  Medication Sig Dispense Refill  . busPIRone (BUSPAR) 5 MG tablet TK 1 T PO BID. DO NOT DRIVE OR OPERATE MACHINERY  0  . clonazePAM (KLONOPIN) 0.5 MG tablet Take 0.5 mg by mouth 2 (two) times daily as needed for anxiety.    . DULoxetine (CYMBALTA) 60 MG capsule TK ONE C PO BID  1  . furosemide (LASIX) 40 MG tablet TK 1 T ONCE A DAY  0  . ibuprofen (ADVIL,MOTRIN) 800 MG tablet Take 800 mg by mouth every 6 (six) hours as needed.    . lactulose (CHRONULAC) 10 GM/15ML solution TAKE 15 ML BY MOUTH TWICE DAILY AS NEEDED IF NO BOWEL MOVEMENT IN 24 HOURS, STOP IF BOWEL MOVEMENT OCCURS 450 mL 0  . losartan (COZAAR) 100 MG tablet TK 1 T PO QD  2  . meclizine (ANTIVERT) 25 MG tablet Take 1 tablet (25 mg total) by mouth 3 (three) times daily as needed. 30 tablet 0  . metFORMIN (GLUCOPHAGE) 500 MG tablet Take 500 mg by mouth 2 (two) times daily with a meal.     . ondansetron (ZOFRAN ODT) 4 MG disintegrating tablet Take 1 tablet (4 mg total) by mouth every 8 (eight) hours as needed for nausea or vomiting. 30 tablet 1  . POTASSIUM PO Take 1 tablet by mouth 2 (two) times daily.    . tamoxifen (NOLVADEX) 20 MG tablet Take 1 tablet (20 mg total) by mouth daily. 90 tablet 1  . venlafaxine XR (EFFEXOR-XR) 150 MG 24 hr capsule Take 150 mg by mouth daily with breakfast.     . Ginkgo Biloba (GINKOBA PO) Take 25 mg by mouth daily.    Marland Kitchen omeprazole (PRILOSEC) 10 MG capsule Take 10 mg by mouth daily.    . orphenadrine (NORFLEX) 100 MG tablet TK 1 T PO  BID PRN P  2   No current facility-administered medications for this visit.     OBJECTIVE: Vitals:   05/28/16 1535  BP: (!) 167/109  Pulse: 93  Resp: 20  Temp: 97.8 F (36.6 C)     Body mass index is 54.41 kg/m.    ECOG FS:0 - Asymptomatic  General: Well-developed, well-nourished, no acute distress. Eyes: Pink conjunctiva, anicteric sclera. Breasts: Exam deferred today. Lungs: Clear to auscultation  bilaterally. Heart: Regular rate and rhythm. No rubs, murmurs, or gallops. Abdomen: Soft, nontender, nondistended. No organomegaly noted, normoactive bowel sounds. Musculoskeletal: No edema, cyanosis, or clubbing. Neuro: Alert, answering all questions appropriately. Cranial nerves grossly intact. Skin: No rashes or petechiae noted. Psych: Pt has sad affect, appearing subdued. Lymphatics: No cervical, calvicular, axillary or inguinal LAD.   LAB RESULTS:  Lab Results  Component Value Date   NA 144 04/22/2016   K 4.2 04/22/2016   CL 105 04/22/2016   CO2 30 04/22/2016   GLUCOSE 134 (H) 04/22/2016   BUN 16 04/22/2016   CREATININE 0.61 04/22/2016   CALCIUM 8.9 04/22/2016   PROT 7.4 11/26/2015   ALBUMIN 4.0 11/26/2015   AST 16 11/26/2015   ALT 12 (L) 11/26/2015   ALKPHOS 49 11/26/2015   BILITOT 0.3 11/26/2015   GFRNONAA >60 04/22/2016  GFRAA >60 04/22/2016    Lab Results  Component Value Date   WBC 8.2 04/22/2016   NEUTROABS 4.3 04/22/2016   HGB 12.1 04/22/2016   HCT 37.3 04/22/2016   MCV 91.9 04/22/2016   PLT 300 04/22/2016     STUDIES: No results found.  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 Patient appeared to have 2 adjacent primaries.  PLAN:    1. 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 Patient had lumpectomy and MammoSite treatment in February 2013. She also received adjuvant chemotherapy. She initiated tamoxifen in approximately July 2013. She will require to take this for 5 years completing in July 2018. Patient's most recent mammogram on Sep 05, 2015 was reported as BI-RADS 2. Repeat mammogram to be scheduled for May 2018. Return to clinic in 6 months for routine evaluation. 2. Muscle cramping: Unlikely related to  tamoxifen. Despite this, patient had previously been instructed to hold treatment for 1 month to see if her symptoms improve.  They did not and she has resumed taking. 3. Dizziness: Unclear etiology. Possibly related to elevated blood pressure. Monitor. 4. Hypertension: Patient blood pressure is mildly elevated today, continue treatment per primary care.   5. [place]: Discussed 4-7-8 breathing technique at bedtime: breathe in to count of 4, hold breath for count of 7, exhale for count of 8; do 3-5 times for letting go of overactive thoughts. 6. Fatigue: Discussed energizing breathing techniques; provided breath ratio chart handout outlining relaxing, balancing, and energizing breathing techniques.    Patient expressed understanding and was in agreement with this plan. She also understands that She can call clinic at any time with any questions, concerns, or complaints.   Lucendia Herrlich, NP  06/01/16 8:56 AM   Patient was seen and evaluated independently and I agree with the assessment and plan as dictated above. Patient has been instructed to continue close follow-up with primary care to continue to address her multiple medical complaints which are unrelated to tamoxifen.  Lloyd Huger, MD 06/01/16 8:57 AM

## 2016-05-28 ENCOUNTER — Inpatient Hospital Stay: Payer: Medicare Other | Attending: Oncology | Admitting: Oncology

## 2016-05-28 VITALS — BP 167/109 | HR 93 | Temp 97.8°F | Resp 20 | Wt 337.1 lb

## 2016-05-28 DIAGNOSIS — M79632 Pain in left forearm: Secondary | ICD-10-CM | POA: Diagnosis not present

## 2016-05-28 DIAGNOSIS — R5383 Other fatigue: Secondary | ICD-10-CM | POA: Insufficient documentation

## 2016-05-28 DIAGNOSIS — Z923 Personal history of irradiation: Secondary | ICD-10-CM | POA: Insufficient documentation

## 2016-05-28 DIAGNOSIS — M542 Cervicalgia: Secondary | ICD-10-CM | POA: Insufficient documentation

## 2016-05-28 DIAGNOSIS — K5900 Constipation, unspecified: Secondary | ICD-10-CM | POA: Insufficient documentation

## 2016-05-28 DIAGNOSIS — R0602 Shortness of breath: Secondary | ICD-10-CM | POA: Diagnosis not present

## 2016-05-28 DIAGNOSIS — Z803 Family history of malignant neoplasm of breast: Secondary | ICD-10-CM | POA: Insufficient documentation

## 2016-05-28 DIAGNOSIS — G4730 Sleep apnea, unspecified: Secondary | ICD-10-CM | POA: Diagnosis not present

## 2016-05-28 DIAGNOSIS — C50412 Malignant neoplasm of upper-outer quadrant of left female breast: Secondary | ICD-10-CM | POA: Insufficient documentation

## 2016-05-28 DIAGNOSIS — Z87891 Personal history of nicotine dependence: Secondary | ICD-10-CM | POA: Diagnosis not present

## 2016-05-28 DIAGNOSIS — Z7984 Long term (current) use of oral hypoglycemic drugs: Secondary | ICD-10-CM | POA: Insufficient documentation

## 2016-05-28 DIAGNOSIS — R110 Nausea: Secondary | ICD-10-CM | POA: Diagnosis not present

## 2016-05-28 DIAGNOSIS — Z9221 Personal history of antineoplastic chemotherapy: Secondary | ICD-10-CM | POA: Insufficient documentation

## 2016-05-28 DIAGNOSIS — Z170 Estrogen receptor positive status [ER+]: Secondary | ICD-10-CM | POA: Insufficient documentation

## 2016-05-28 DIAGNOSIS — E119 Type 2 diabetes mellitus without complications: Secondary | ICD-10-CM | POA: Diagnosis not present

## 2016-05-28 DIAGNOSIS — R531 Weakness: Secondary | ICD-10-CM | POA: Insufficient documentation

## 2016-05-28 DIAGNOSIS — R42 Dizziness and giddiness: Secondary | ICD-10-CM | POA: Diagnosis not present

## 2016-05-28 DIAGNOSIS — F419 Anxiety disorder, unspecified: Secondary | ICD-10-CM | POA: Insufficient documentation

## 2016-05-28 DIAGNOSIS — M25561 Pain in right knee: Secondary | ICD-10-CM | POA: Insufficient documentation

## 2016-05-28 DIAGNOSIS — I10 Essential (primary) hypertension: Secondary | ICD-10-CM | POA: Diagnosis not present

## 2016-05-28 NOTE — Progress Notes (Signed)
Patient is here for follow up, she mentions pain in her knees that is worse, she hurts all over. She does have SOB. She does have some left arm pain but denies jaw pain. She feels very weak and fatigued

## 2016-06-07 IMAGING — US US ABDOMEN LIMITED
1 series · 14 of 25 positions shown · non-contrast
Comparison: Prior ultrasound from 07/04/2013.

CLINICAL DATA: Initial valuation for acute right upper quadrant
pain.

EXAM:
US ABDOMEN LIMITED - RIGHT UPPER QUADRANT

[Series 1: us abdomen limited · 0.24mm/px · 14 of 34 slices shown]
[im 1/34]
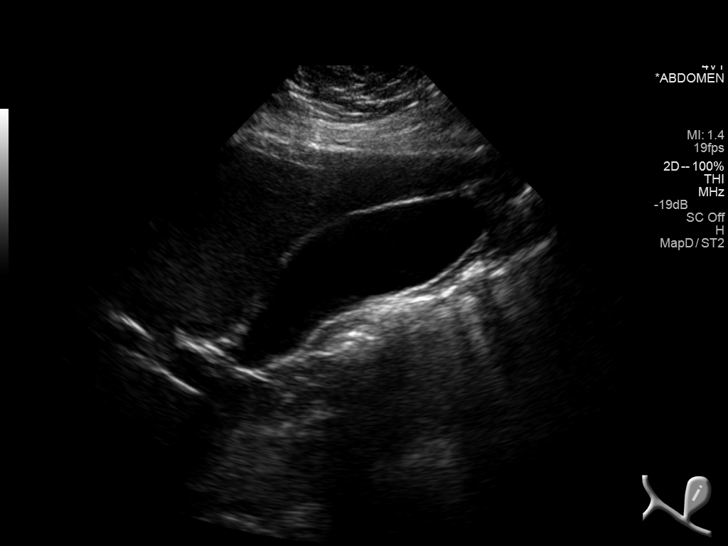
[im 3/34]
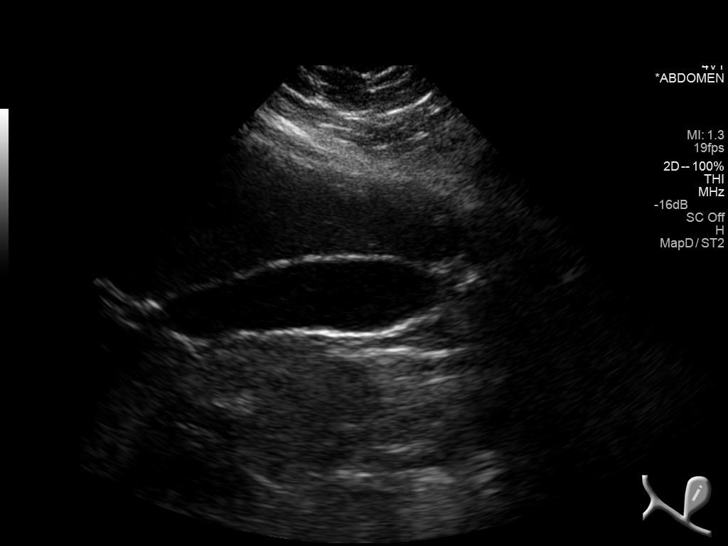
[im 6/34]
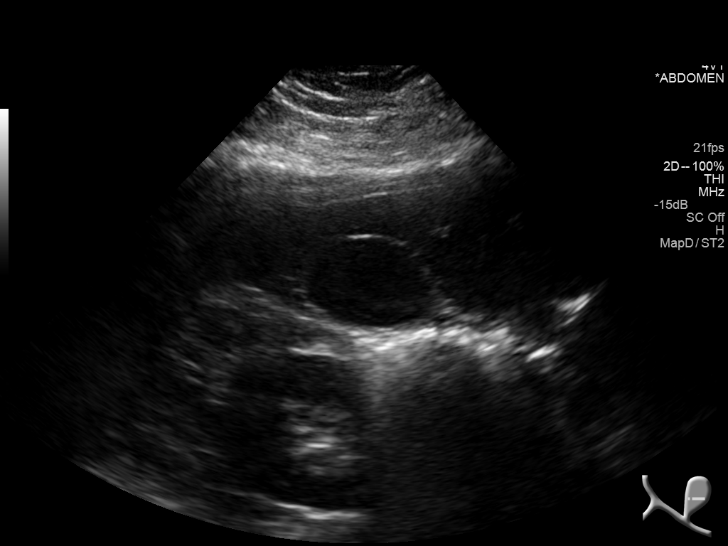
[im 9/34]
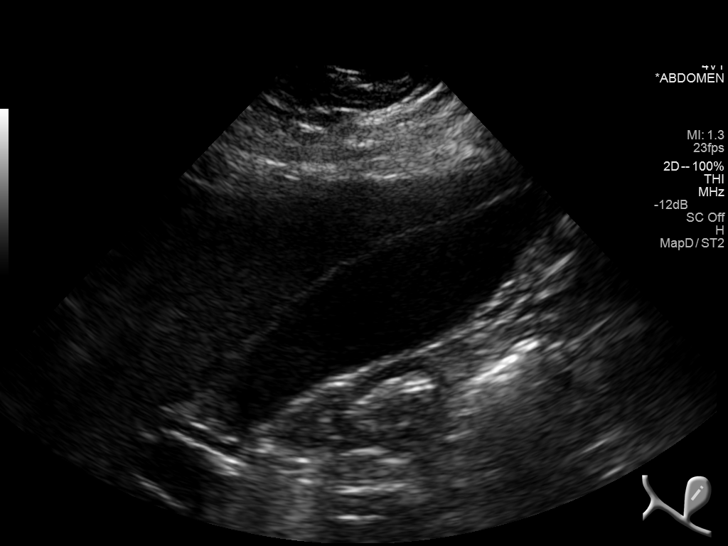
[im 12/34]
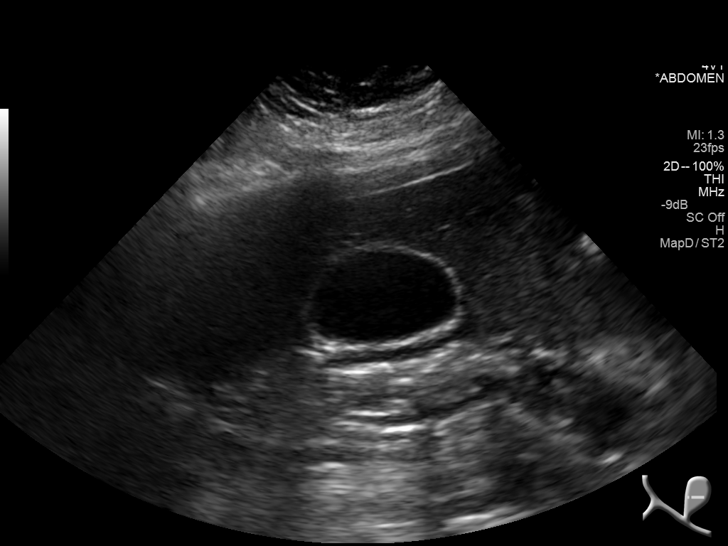
[im 13/34]
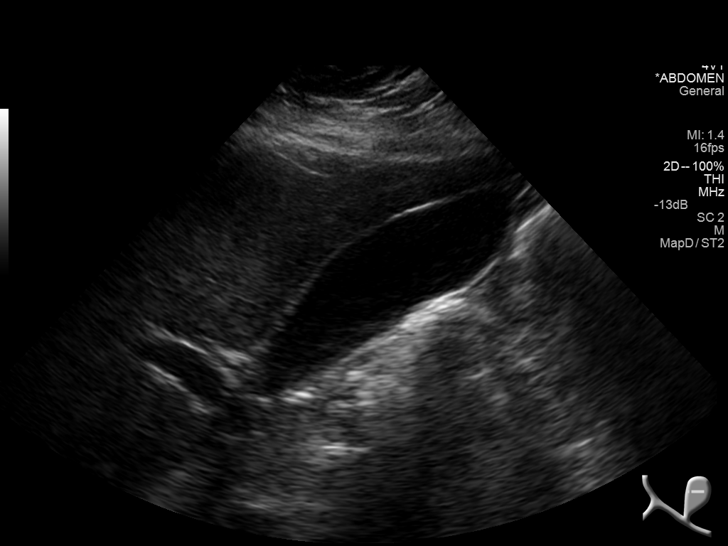
[im 16/34]
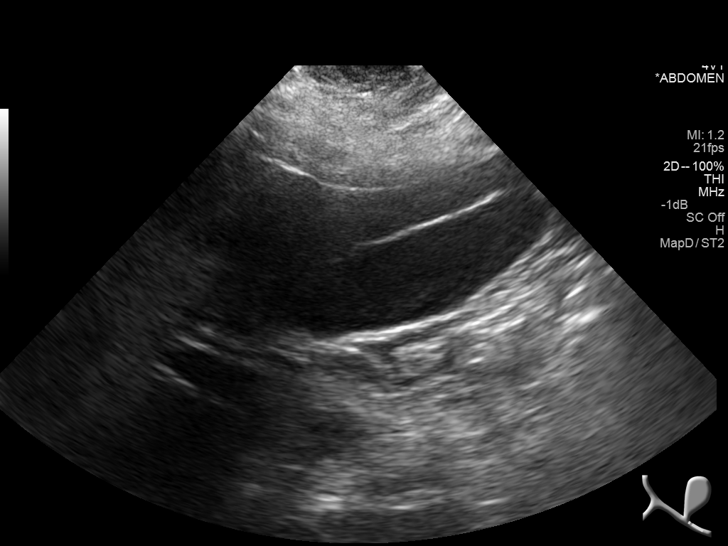
[im 18/34]
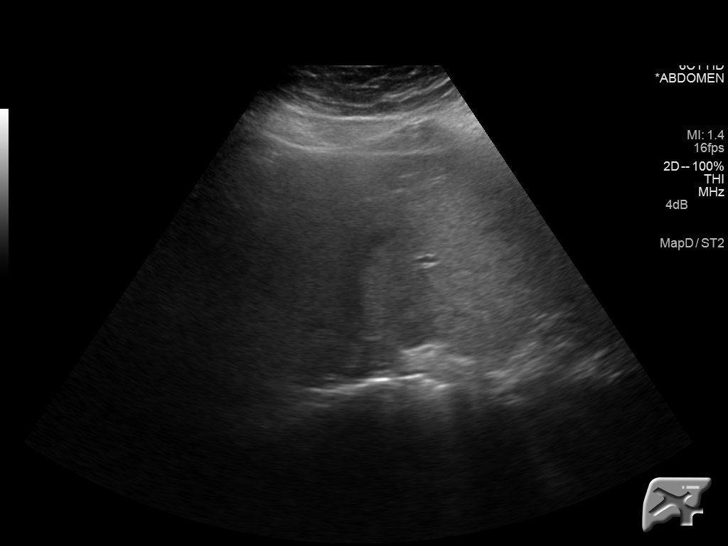
[im 21/34]
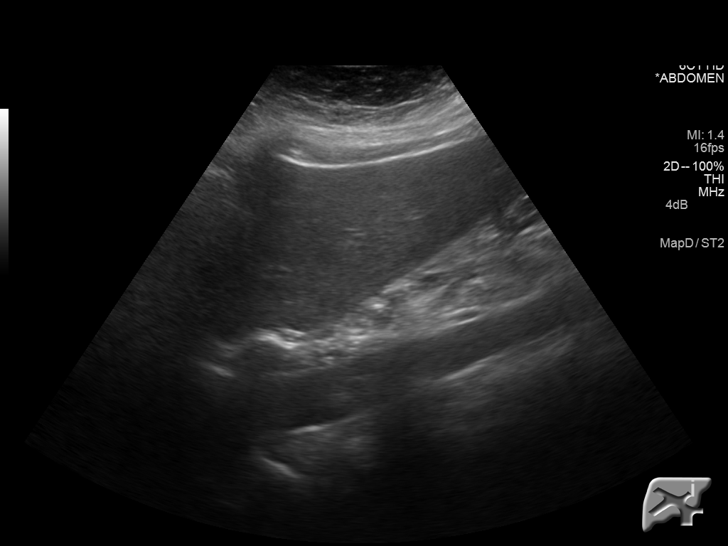
[im 23/34]
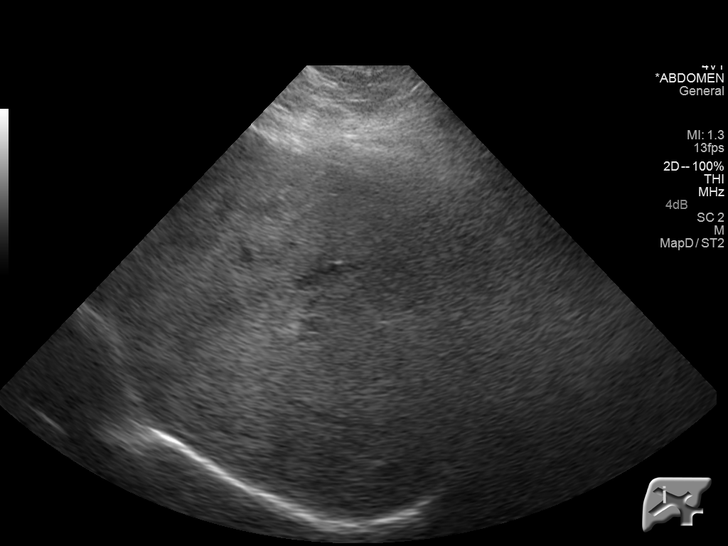
[im 25/34]
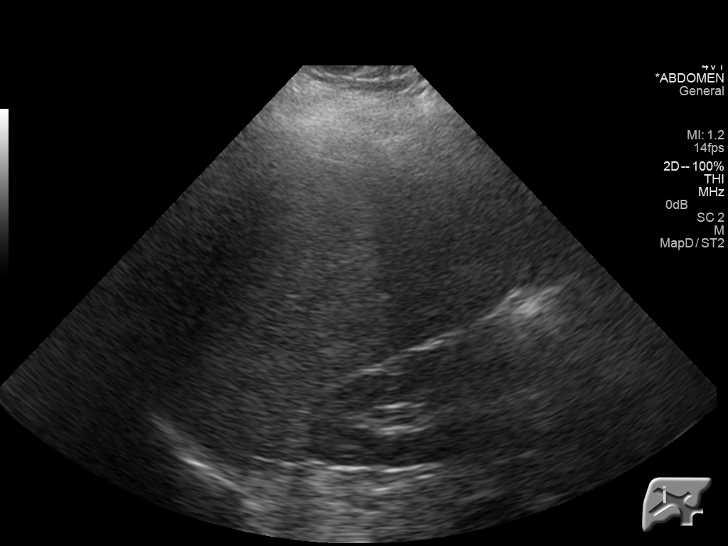
[im 28/34]
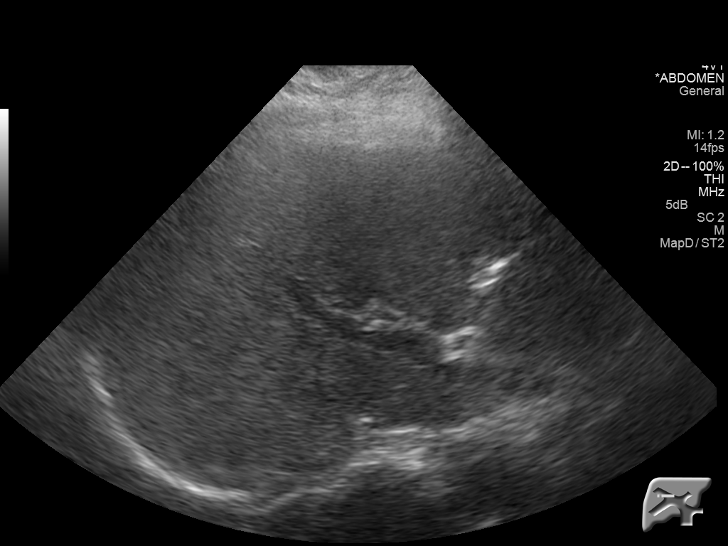
[im 31/34]
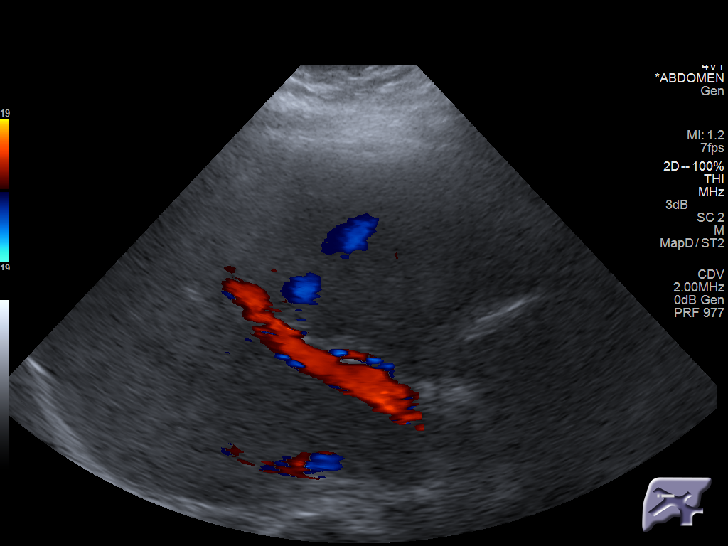
[im 34/34]
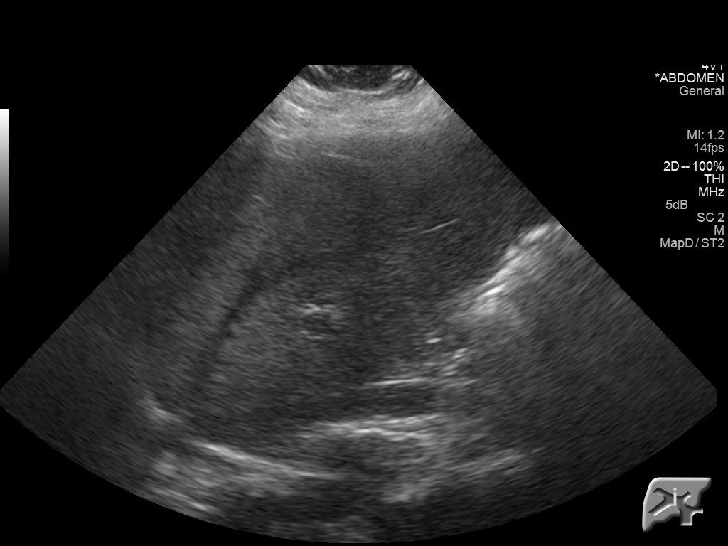

[14 of 25 positions shown; findings below may reference images not displayed]

FINDINGS: Gallbladder:

No gallstones or wall thickening visualized. No sonographic Murphy
sign noted.

Common bile duct:

Diameter: 3.3 mm

Liver:

No focal lesion identified. Diffusely increased echogenicity
suggestive of steatosis.
IMPRESSION: 1. No sonographic evidence for cholelithiasis, acute cholecystitis,
or biliary dilatation.
2. Hepatic steatosis.

## 2016-06-26 ENCOUNTER — Encounter (HOSPITAL_BASED_OUTPATIENT_CLINIC_OR_DEPARTMENT_OTHER): Payer: Self-pay | Admitting: Emergency Medicine

## 2016-06-26 ENCOUNTER — Emergency Department (HOSPITAL_BASED_OUTPATIENT_CLINIC_OR_DEPARTMENT_OTHER)
Admission: EM | Admit: 2016-06-26 | Discharge: 2016-06-26 | Disposition: A | Discharge status: Home / Self Care | Payer: Medicare Other | Attending: Emergency Medicine | Admitting: Emergency Medicine

## 2016-06-26 DIAGNOSIS — Z853 Personal history of malignant neoplasm of breast: Secondary | ICD-10-CM | POA: Diagnosis not present

## 2016-06-26 DIAGNOSIS — Z79899 Other long term (current) drug therapy: Secondary | ICD-10-CM | POA: Insufficient documentation

## 2016-06-26 DIAGNOSIS — I10 Essential (primary) hypertension: Secondary | ICD-10-CM | POA: Insufficient documentation

## 2016-06-26 DIAGNOSIS — Z87891 Personal history of nicotine dependence: Secondary | ICD-10-CM | POA: Insufficient documentation

## 2016-06-26 DIAGNOSIS — E119 Type 2 diabetes mellitus without complications: Secondary | ICD-10-CM | POA: Insufficient documentation

## 2016-06-26 DIAGNOSIS — Z7984 Long term (current) use of oral hypoglycemic drugs: Secondary | ICD-10-CM | POA: Diagnosis not present

## 2016-06-26 DIAGNOSIS — T7840XA Allergy, unspecified, initial encounter: Secondary | ICD-10-CM | POA: Diagnosis not present

## 2016-06-26 MED ORDER — FAMOTIDINE IN NACL 20-0.9 MG/50ML-% IV SOLN
20.0000 mg | Freq: Once | INTRAVENOUS | Status: AC
  Administered 2016-06-26: 20 mg via INTRAVENOUS
  Filled 2016-06-26: qty 50

## 2016-06-26 MED ORDER — DEXAMETHASONE SODIUM PHOSPHATE 10 MG/ML IJ SOLN
10.0000 mg | Freq: Once | INTRAMUSCULAR | Status: AC
  Administered 2016-06-26: 10 mg via INTRAVENOUS
  Filled 2016-06-26: qty 1

## 2016-06-26 MED ORDER — DIPHENHYDRAMINE HCL 50 MG/ML IJ SOLN
50.0000 mg | Freq: Once | INTRAMUSCULAR | Status: AC
  Administered 2016-06-26: 50 mg via INTRAVENOUS
  Filled 2016-06-26: qty 1

## 2016-06-26 MED ORDER — HYDROXYZINE HCL 25 MG PO TABS: 25.0000 mg | ORAL_TABLET | Freq: Four times a day (QID) | ORAL | 0 refills | Status: DC | PRN

## 2016-06-26 NOTE — ED Notes (Signed)
Pt reports improvement in itching and periorbital swelling appears to be improving.

## 2016-06-26 NOTE — ED Provider Notes (Signed)
[facility] Provider Note: Georgena Spurling, MD, FACEP  CSN: [id number] MRN: [id number] ARRIVAL: 06/26/16 at [place] Problem   HISTORY OF PRESENT ILLNESS  Sherry Price is a 46 y.o. female who has developed several pruritic, edematous skin lesions over the past 5 days. The first one developed on her right forearm and she has been scratching it and placing Benadryl and Neosporin topically. Yesterday she developed a lesion on her left forearm. This morning she awakened with swelling above her right thigh and this concerned her. In addition to the lesions being pruritic there is also pain associated with the right eyelid lesion. She is not sure what made triggered this. She has been looking for evidence of bed bugs in her house without finding any. She is on tamoxifen for breast cancer. She has recently had her antihypertensive regimen changed. She is not on an ACE inhibitor or angiotensin receptor blocker.  Past Medical History:  Diagnosis Date  . Arthritis   . Breast cancer ([place])    left breast - chemo and radiation  . Diabetes mellitus without complication ([place])   . Hypertension   . Malignant neoplasm of 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 ([place]) 05/21/2011   Left breast, 2 foci: 1.2 and 1.5 cm, histologic grade 3, ER 1-5%; PR 1-5%, HER-2/neu not overexpressed.  . Panic attacks   . Sleep apnea     Past Surgical History:  Procedure Laterality Date  . BREAST LUMPECTOMY  2013  . BREAST MAMMOSITE Left February 2013  . BREAST REDUCTION SURGERY  2005  . COLONOSCOPY WITH PROPOFOL N/A 01/09/2015   Procedure: COLONOSCOPY WITH PROPOFOL;  Surgeon: Lucilla Lame, MD;  Location: [facility] ENDOSCOPY;  Service: Endoscopy;  Laterality: N/A;  . ESOPHAGOGASTRODUODENOSCOPY (EGD) WITH PROPOFOL N/A 01/09/2015   Procedure: ESOPHAGOGASTRODUODENOSCOPY (EGD) WITH PROPOFOL;  Surgeon: Lucilla Lame, MD;  Location: [facility] ENDOSCOPY;  Service: Endoscopy;  Laterality:  N/A;  . OOPHORECTOMY Right 2013  . REDUCTION MAMMAPLASTY      Family History  Problem Relation Age of Onset  . Hypertension Mother   . Heart disease Mother   . Diabetes Mother   . Breast cancer Cousin     Social History  Substance Use Topics  . Smoking status: Former Smoker    Years: 26.00  . Smokeless tobacco: Never Used  . Alcohol use Yes     Comment: occasionally    Prior to Admission medications   Medication Sig Start Date End Date Taking? Authorizing Provider  busPIRone (BUSPAR) 5 MG tablet TK 1 T PO BID. DO NOT DRIVE OR OPERATE MACHINERY 05/15/15   Historical Provider, MD  clonazePAM (KLONOPIN) 0.5 MG tablet Take 0.5 mg by mouth 2 (two) times daily as needed for anxiety.    Historical Provider, MD  DULoxetine (CYMBALTA) 60 MG capsule TK ONE C PO BID 05/06/16   Historical Provider, MD  furosemide (LASIX) 40 MG tablet TK 1 T ONCE A DAY 05/07/16   Historical Provider, MD  Ginkgo Biloba (GINKOBA PO) Take 25 mg by mouth daily.    Historical Provider, MD  ibuprofen (ADVIL,MOTRIN) 800 MG tablet Take 800 mg by mouth every 6 (six) hours as needed.    Historical Provider, MD  lactulose (CHRONULAC) 10 GM/15ML solution TAKE 15 ML BY MOUTH TWICE DAILY AS NEEDED IF NO BOWEL MOVEMENT IN 24 HOURS, STOP IF BOWEL MOVEMENT OCCURS 11/07/15   Cammie Sickle, MD  losartan (COZAAR) 100 MG tablet TK 1 T PO  QD 04/29/16   Historical Provider, MD  meclizine (ANTIVERT) 25 MG tablet Take 1 tablet (25 mg total) by mouth 3 (three) times daily as needed. 04/22/16   Deno Etienne, DO  metFORMIN (GLUCOPHAGE) 500 MG tablet Take 500 mg by mouth 2 (two) times daily with a meal.  10/23/13   Historical Provider, MD  omeprazole (PRILOSEC) 10 MG capsule Take 10 mg by mouth daily.    Historical Provider, MD  ondansetron (ZOFRAN ODT) 4 MG disintegrating tablet Take 1 tablet (4 mg total) by mouth every 8 (eight) hours as needed for nausea or vomiting. 12/07/14   Lucilla Lame, MD  orphenadrine (NORFLEX) 100 MG tablet TK 1 T PO   BID PRN P 05/09/16   Historical Provider, MD  POTASSIUM PO Take 1 tablet by mouth 2 (two) times daily.    Historical Provider, MD  tamoxifen (NOLVADEX) 20 MG tablet Take 1 tablet (20 mg total) by mouth daily. 01/04/16   Lloyd Huger, MD  venlafaxine XR (EFFEXOR-XR) 150 MG 24 hr capsule Take 150 mg by mouth daily with breakfast.  01/11/14   Historical Provider, MD    Allergies Patient has no known allergies.   REVIEW OF SYSTEMS  Negative except as noted here or in the History of Present Illness.   PHYSICAL EXAMINATION  Initial Vital Signs Blood pressure (!) 154/121, pulse 107, temperature 97.6 F (36.4 C), temperature source Oral, resp. rate 16, SpO2 99 %.  Examination General: Well-developed, obese female in no acute distress; appearance consistent with age of record HENT: normocephalic; atraumatic Eyes: pupils equal, round and reactive to light; extraocular muscles intact; arcus senilis bilaterally  Neck: supple Heart: regular rate and rhythm Lungs: clear to auscultation bilaterally Abdomen: soft;  obese nontender; bowel sounds present Extremities: No deformity; full range of motion; pulses normal Neurologic: Awake, alert and oriented; motor function intact in all extremities and symmetric; no facial droop Skin: Warm and dry; resolving lesion of right forearm with evidence of scratching; raised, edematous lesion of left forearm without erythema or warmth:   Right supraorbital edema without erythema or warmth:  Psychiatric: Normal mood and affect   RESULTS  Summary of this visit's results, reviewed by myself:   EKG Interpretation  Date/Time:    Ventricular Rate:    PR Interval:    QRS Duration:   QT Interval:    QTC Calculation:   R Axis:     Text Interpretation:        Laboratory Studies: No results found for this or any previous visit (from the past 24 hour(s)). Imaging Studies: No results found.  ED COURSE  Nursing notes and initial vitals signs, including  pulse oximetry, reviewed.  Vitals:   06/26/16 0409  BP: (!) 154/121  Pulse: 107  Resp: 16  Temp: 97.6 F (36.4 C)  TempSrc: Oral  SpO2: 99%   4:58 AM Itching and edema improved after IV medications. This appears to be an allergic reaction with localized angioedema. It does not appear to be infectious. There is no airway involvement or compromise.  PROCEDURES    ED DIAGNOSES     ICD-9-CM ICD-10-CM   1. Allergic reaction, initial encounter 995.3 T78.40XA        Shanon Rosser, MD 06/26/16 0500

## 2016-06-26 NOTE — ED Triage Notes (Signed)
Pt has area on right upper arm, left forearm and right breast that have been itching and are now red and swollen. Pt awoke this morning with swelling around right eye.

## 2016-07-23 ENCOUNTER — Emergency Department
Admission: EM | Admit: 2016-07-23 | Discharge: 2016-07-23 | Disposition: A | Discharge status: Home / Self Care | Payer: Medicare Other | Attending: Emergency Medicine | Admitting: Emergency Medicine

## 2016-07-23 DIAGNOSIS — R1030 Lower abdominal pain, unspecified: Secondary | ICD-10-CM | POA: Insufficient documentation

## 2016-07-23 DIAGNOSIS — Z87891 Personal history of nicotine dependence: Secondary | ICD-10-CM | POA: Insufficient documentation

## 2016-07-23 DIAGNOSIS — E119 Type 2 diabetes mellitus without complications: Secondary | ICD-10-CM | POA: Diagnosis not present

## 2016-07-23 DIAGNOSIS — J020 Streptococcal pharyngitis: Secondary | ICD-10-CM | POA: Insufficient documentation

## 2016-07-23 DIAGNOSIS — I10 Essential (primary) hypertension: Secondary | ICD-10-CM | POA: Diagnosis not present

## 2016-07-23 DIAGNOSIS — Z7984 Long term (current) use of oral hypoglycemic drugs: Secondary | ICD-10-CM | POA: Insufficient documentation

## 2016-07-23 DIAGNOSIS — Z79899 Other long term (current) drug therapy: Secondary | ICD-10-CM | POA: Insufficient documentation

## 2016-07-23 DIAGNOSIS — R079 Chest pain, unspecified: Secondary | ICD-10-CM | POA: Insufficient documentation

## 2016-07-23 DIAGNOSIS — Z853 Personal history of malignant neoplasm of breast: Secondary | ICD-10-CM | POA: Diagnosis not present

## 2016-07-23 DIAGNOSIS — M542 Cervicalgia: Secondary | ICD-10-CM | POA: Diagnosis present

## 2016-07-23 LAB — URINALYSIS, COMPLETE (UACMP) WITH MICROSCOPIC
BACTERIA UA: NONE SEEN
Bilirubin Urine: NEGATIVE
Glucose, UA: NEGATIVE mg/dL
Hgb urine dipstick: NEGATIVE
Ketones, ur: NEGATIVE mg/dL
Leukocytes, UA: NEGATIVE
Nitrite: NEGATIVE
Protein, ur: NEGATIVE mg/dL
SPECIFIC GRAVITY, URINE: 1.024 (ref 1.005–1.030)
pH: 5 (ref 5.0–8.0)

## 2016-07-23 LAB — COMPREHENSIVE METABOLIC PANEL
ALBUMIN: 3.4 g/dL — AB (ref 3.5–5.0)
ALT: 17 U/L (ref 14–54)
AST: 21 U/L (ref 15–41)
Alkaline Phosphatase: 54 U/L (ref 38–126)
Anion gap: 7 (ref 5–15)
BILIRUBIN TOTAL: 0.4 mg/dL (ref 0.3–1.2)
BUN: 10 mg/dL (ref 6–20)
CHLORIDE: 104 mmol/L (ref 101–111)
CO2: 27 mmol/L (ref 22–32)
CREATININE: 0.62 mg/dL (ref 0.44–1.00)
Calcium: 8.5 mg/dL — ABNORMAL LOW (ref 8.9–10.3)
GFR calc Af Amer: >60 mL/min (ref >60)
GFR calc non Af Amer: >60 mL/min (ref >60)
GLUCOSE: 154 mg/dL — AB (ref 65–99)
POTASSIUM: 3.7 mmol/L (ref 3.5–5.1)
Sodium: 138 mmol/L (ref 135–145)
TOTAL PROTEIN: 6.9 g/dL (ref 6.5–8.1)

## 2016-07-23 LAB — CBC
HEMATOCRIT: 34.4 % — AB (ref 35.0–47.0)
Hemoglobin: 11.6 g/dL — ABNORMAL LOW (ref 12.0–16.0)
MCH: 29.8 pg (ref 26.0–34.0)
MCHC: 33.6 g/dL (ref 32.0–36.0)
MCV: 88.5 fL (ref 80.0–100.0)
PLATELETS: 290 10*3/uL (ref 150–440)
RBC: 3.88 MIL/uL (ref 3.80–5.20)
RDW: 15 % — AB (ref 11.5–14.5)
WBC: 19.1 10*3/uL — ABNORMAL HIGH (ref 3.6–11.0)

## 2016-07-23 LAB — TROPONIN I: TROPONIN I: 0.03 ng/mL — AB (ref <0.03)

## 2016-07-23 LAB — POCT RAPID STREP A: STREPTOCOCCUS, GROUP A SCREEN (DIRECT): POSITIVE — AB

## 2016-07-23 LAB — LIPASE, BLOOD: Lipase: <10 U/L — ABNORMAL LOW (ref 11–51)

## 2016-07-23 MED ORDER — CEPHALEXIN 500 MG PO CAPS
500.0000 mg | ORAL_CAPSULE | Freq: Once | ORAL | Status: AC
  Administered 2016-07-23: 500 mg via ORAL
  Filled 2016-07-23: qty 1

## 2016-07-23 MED ORDER — CEPHALEXIN 500 MG PO CAPS: 500.0000 mg | ORAL_CAPSULE | Freq: Three times a day (TID) | ORAL | 0 refills | Status: DC

## 2016-07-23 NOTE — Discharge Instructions (Signed)
Please take your antibiotics as prescribed for their entire course. Return to the emergency department for any acute/concerning symptoms otherwise this follow-up to primary care doctor tomorrow for recheck/reevaluation.

## 2016-07-23 NOTE — ED Notes (Signed)
Pt c/o sore throat, neck pain, chest pain and lower abd pain, this has been going on for "months" but has worsened over past couple days.

## 2016-07-23 NOTE — ED Provider Notes (Signed)
[facility] Emergency Department Provider Note  Time seen: 11:23 AM  I have reviewed the triage vital signs and the nursing notes.   HISTORY  Chief Complaint Abdominal Pain    HPI Sherry Price is a 46 y.o. female with a past medical history of diabetes, hypertension, presents the emergency department with multiple complaints. According to the patient for the past 2 months she has been feeling weak and fatigued. States intermittent chest pains or she describes as sharp central chest pains over the past 2-3 months. Also states neck pain or she again describes as sharp stabbing pain in the back of her neck for the past 2 or 3 months intermittently. States she is also having a sore throat which started this morning, several weeks of lower abdominal discomfort although denies any dysuria or hematuria. When asked why the patient came to the emergency department today she states more so because of the sore throat but wants to be evaluated for her other complaints as well.  Past Medical History:  Diagnosis Date  . Arthritis   . Breast cancer ([place])    left breast - chemo and radiation  . Diabetes mellitus without complication ([place])   . Hypertension   . Malignant neoplasm of 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 ([place]) 05/21/2011   Left breast, 2 foci: 1.2 and 1.5 cm, histologic grade 3, ER 1-5%; PR 1-5%, HER-2/neu not overexpressed.  . Panic attacks   . Sleep apnea     Patient Active Problem List   Diagnosis Date Noted  . Cough with expectoration 02/27/2015  . Benign essential HTN 02/19/2015  . Heartburn   . Gastritis   . Change in bowel habits   . Benign neoplasm of ascending colon   . Anxiety 10/26/2013  . Affective bipolar disorder ([place]) 10/26/2013  . Essential (primary) hypertension 10/26/2013  . 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 Extreme obesity ([place]) 10/26/2013  . Bipolar affective disorder ([place]) 10/26/2013  .  Malignant neoplasm of breast ([place]) 10/26/2013  . Morbid obesity ([place]) 10/26/2013  . Diabetes mellitus ([place]) 10/26/2013  . 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 Breast cancer of upper-outer quadrant of left female breast ([place]) 05/21/2011    Past Surgical History:  Procedure Laterality Date  . BREAST LUMPECTOMY  2013  . BREAST MAMMOSITE Left February 2013  . BREAST REDUCTION SURGERY  2005  . COLONOSCOPY WITH PROPOFOL N/A 01/09/2015   Procedure: COLONOSCOPY WITH PROPOFOL;  Surgeon: Lucilla Lame, MD;  Location: [facility] ENDOSCOPY;  Service: Endoscopy;  Laterality: N/A;  . ESOPHAGOGASTRODUODENOSCOPY (EGD) WITH PROPOFOL N/A 01/09/2015   Procedure: ESOPHAGOGASTRODUODENOSCOPY (EGD) WITH PROPOFOL;  Surgeon: Lucilla Lame, MD;  Location: [facility] ENDOSCOPY;  Service: Endoscopy;  Laterality: N/A;  . OOPHORECTOMY Right 2013  . REDUCTION MAMMAPLASTY      Prior to Admission medications   Medication Sig Start Date End Date Taking? Authorizing Provider  busPIRone (BUSPAR) 5 MG tablet TK 1 T PO BID. DO NOT DRIVE OR OPERATE MACHINERY 05/15/15   Historical Provider, MD  clonazePAM (KLONOPIN) 0.5 MG tablet Take 0.5 mg by mouth 2 (two) times daily as needed for anxiety.    Historical Provider, MD  DULoxetine (CYMBALTA) 60 MG capsule TK ONE C PO BID 05/06/16   Historical Provider, MD  furosemide (LASIX) 40 MG tablet TK 1 T ONCE A DAY 05/07/16   Historical Provider, MD  Ginkgo Biloba (GINKOBA PO) Take 25 mg by mouth daily.    Historical Provider, MD  hydrOXYzine (ATARAX/VISTARIL)  25 MG tablet Take 1-2 tablets (25-50 mg total) by mouth every 6 (six) hours as needed for itching (may cause drowsiness). 06/26/16   John Molpus, MD  ibuprofen (ADVIL,MOTRIN) 800 MG tablet Take 800 mg by mouth every 6 (six) hours as needed.    Historical Provider, MD  lactulose (CHRONULAC) 10 GM/15ML solution TAKE 15 ML BY MOUTH TWICE DAILY AS NEEDED IF NO BOWEL MOVEMENT IN 24 HOURS, STOP IF BOWEL MOVEMENT OCCURS 11/07/15   Cammie Sickle, MD   losartan (COZAAR) 100 MG tablet TK 1 T PO QD 04/29/16   Historical Provider, MD  meclizine (ANTIVERT) 25 MG tablet Take 1 tablet (25 mg total) by mouth 3 (three) times daily as needed. 04/22/16   Deno Etienne, DO  metFORMIN (GLUCOPHAGE) 500 MG tablet Take 500 mg by mouth 2 (two) times daily with a meal.  10/23/13   Historical Provider, MD  omeprazole (PRILOSEC) 10 MG capsule Take 10 mg by mouth daily.    Historical Provider, MD  ondansetron (ZOFRAN ODT) 4 MG disintegrating tablet Take 1 tablet (4 mg total) by mouth every 8 (eight) hours as needed for nausea or vomiting. 12/07/14   Lucilla Lame, MD  orphenadrine (NORFLEX) 100 MG tablet TK 1 T PO  BID PRN P 05/09/16   Historical Provider, MD  POTASSIUM PO Take 1 tablet by mouth 2 (two) times daily.    Historical Provider, MD  tamoxifen (NOLVADEX) 20 MG tablet Take 1 tablet (20 mg total) by mouth daily. 01/04/16   Lloyd Huger, MD  venlafaxine XR (EFFEXOR-XR) 150 MG 24 hr capsule Take 150 mg by mouth daily with breakfast.  01/11/14   Historical Provider, MD    No Known Allergies  Family History  Problem Relation Age of Onset  . Hypertension Mother   . Heart disease Mother   . Diabetes Mother   . Breast cancer Cousin     Social History Social History  Substance Use Topics  . Smoking status: Former Smoker    Years: 26.00  . Smokeless tobacco: Never Used  . Alcohol use Yes     Comment: occasionally    Review of Systems Constitutional: Fever to 100.0 in the emergency department. Cardiovascular: Intermittent chest pain 2 months Respiratory: Negative for shortness of breath.  Gastrointestinal: Intermittent lower abdominal pain for the past several months. Denies vomiting or diarrhea Genitourinary: Negative for dysuria. Neurological: Negative for headache 10-point ROS otherwise negative.  ____________________________________________   PHYSICAL EXAM:  VITAL SIGNS: ED Triage Vitals [07/23/16 1102]  Enc Vitals Group     BP (!) 162/72      Pulse Rate (!) 111     Resp 20     Temp 99.9 F (37.7 C)     Temp Source Oral     SpO2 100 %     Weight (!) 330 lb (149.7 kg)     Height '5\' 7"'$  (1.702 m)     Head Circumference      Peak Flow      Pain Score 6     Pain Loc      Pain Edu?      Excl. in [place]?     Constitutional: Alert and oriented. Well appearing and in no distress.Occasional cough during examination. Eyes: Normal exam ENT   Head: Normocephalic and atraumatic.   Nose: No congestion/rhinnorhea.   Mouth/Throat: Mucous membranes are moist. Moderate fragile erythema with mild exudates bilaterally no tonsillar hypertrophy or uvula deviation Cardiovascular: Normal rate, regular rhythm, around 100  bpm. No murmur Respiratory: Normal respiratory effort without tachypnea nor retractions. Breath sounds are clear  Gastrointestinal: Soft and nontender. No distention.  Obese. Musculoskeletal: Nontender with normal range of motion in all extremities.  Neurologic:  Normal speech and language. No gross focal neurologic deficits Skin:  Skin is warm, dry and intact.  Psychiatric: Mood and affect are normal.  ____________________________________________    EKG  EKG reviewed and interpreted by myself shows sinus tachycardia 111 bpm, narrow QRS, normal axis, normal intervals, no concerning ST changes.  __________________________________________   INITIAL IMPRESSION / ASSESSMENT AND PLAN / ED COURSE  Pertinent labs & imaging results that were available during my care of the patient were reviewed by me and considered in my medical decision making (see chart for details).  Patient presents to the emergency department with multiple complaints over the past 2 months. Patient's acute complaints today include sore throat which started this morning. Patient does have moderate fragile erythema with exudates. Patient's strep test is positive, white blood cell count of 19,000. Patient's labs otherwise at baseline. Troponin  normal at 0.03. EKG is reassuring. I suspect the patient's symptoms are due to acute pharyngitis. We will discharge with Keflex and have the patient follow-up with her primary care doctor.  ____________________________________________   FINAL CLINICAL IMPRESSION(S) / ED DIAGNOSES   streptococcal pharyngitis    Harvest Dark, MD 07/23/16 1358

## 2016-07-23 NOTE — ED Triage Notes (Signed)
Pt c/o sore throat with lower abd pain, generalized weakness , chest pain and neck pain for the past week.Sherry Price

## 2016-07-23 NOTE — ED Notes (Signed)
Critical value relayed to MD [facility]

## 2016-07-24 LAB — CULTURE, GROUP A STREP (THRC)

## 2016-08-04 ENCOUNTER — Other Ambulatory Visit: Payer: Self-pay

## 2016-08-04 MED ORDER — TAMOXIFEN CITRATE 20 MG PO TABS: 20.0000 mg | ORAL_TABLET | Freq: Every day | ORAL | 1 refills | Status: DC

## 2016-09-02 ENCOUNTER — Emergency Department (HOSPITAL_BASED_OUTPATIENT_CLINIC_OR_DEPARTMENT_OTHER): Payer: Medicare Other

## 2016-09-02 ENCOUNTER — Emergency Department (HOSPITAL_BASED_OUTPATIENT_CLINIC_OR_DEPARTMENT_OTHER)
Admission: EM | Admit: 2016-09-02 | Discharge: 2016-09-02 | Disposition: A | Discharge status: Home / Self Care | Payer: Medicare Other | Attending: Emergency Medicine | Admitting: Emergency Medicine

## 2016-09-02 ENCOUNTER — Encounter (HOSPITAL_BASED_OUTPATIENT_CLINIC_OR_DEPARTMENT_OTHER): Payer: Self-pay

## 2016-09-02 DIAGNOSIS — E119 Type 2 diabetes mellitus without complications: Secondary | ICD-10-CM | POA: Diagnosis not present

## 2016-09-02 DIAGNOSIS — Z791 Long term (current) use of non-steroidal anti-inflammatories (NSAID): Secondary | ICD-10-CM | POA: Insufficient documentation

## 2016-09-02 DIAGNOSIS — Z7984 Long term (current) use of oral hypoglycemic drugs: Secondary | ICD-10-CM | POA: Insufficient documentation

## 2016-09-02 DIAGNOSIS — Z87891 Personal history of nicotine dependence: Secondary | ICD-10-CM | POA: Diagnosis not present

## 2016-09-02 DIAGNOSIS — R519 Headache, unspecified: Secondary | ICD-10-CM

## 2016-09-02 DIAGNOSIS — Z79899 Other long term (current) drug therapy: Secondary | ICD-10-CM | POA: Diagnosis not present

## 2016-09-02 DIAGNOSIS — Z853 Personal history of malignant neoplasm of breast: Secondary | ICD-10-CM | POA: Insufficient documentation

## 2016-09-02 DIAGNOSIS — R42 Dizziness and giddiness: Secondary | ICD-10-CM

## 2016-09-02 DIAGNOSIS — R51 Headache: Secondary | ICD-10-CM

## 2016-09-02 DIAGNOSIS — I10 Essential (primary) hypertension: Secondary | ICD-10-CM | POA: Insufficient documentation

## 2016-09-02 LAB — BASIC METABOLIC PANEL
Anion gap: 8 (ref 5–15)
BUN: 11 mg/dL (ref 6–20)
CHLORIDE: 102 mmol/L (ref 101–111)
CO2: 29 mmol/L (ref 22–32)
Calcium: 8.6 mg/dL — ABNORMAL LOW (ref 8.9–10.3)
Creatinine, Ser: 0.56 mg/dL (ref 0.44–1.00)
Glucose, Bld: 110 mg/dL — ABNORMAL HIGH (ref 65–99)
POTASSIUM: 3.2 mmol/L — AB (ref 3.5–5.1)
SODIUM: 139 mmol/L (ref 135–145)

## 2016-09-02 LAB — URINALYSIS, ROUTINE W REFLEX MICROSCOPIC
Bilirubin Urine: NEGATIVE
GLUCOSE, UA: NEGATIVE mg/dL
Hgb urine dipstick: NEGATIVE
Ketones, ur: NEGATIVE mg/dL
LEUKOCYTES UA: NEGATIVE
NITRITE: NEGATIVE
PH: 7 (ref 5.0–8.0)
Protein, ur: NEGATIVE mg/dL
SPECIFIC GRAVITY, URINE: 1.015 (ref 1.005–1.030)

## 2016-09-02 LAB — CBC
HEMATOCRIT: 36.7 % (ref 36.0–46.0)
HEMOGLOBIN: 12.1 g/dL (ref 12.0–15.0)
MCH: 30.6 pg (ref 26.0–34.0)
MCHC: 33 g/dL (ref 30.0–36.0)
MCV: 92.7 fL (ref 78.0–100.0)
Platelets: 323 10*3/uL (ref 150–400)
RBC: 3.96 MIL/uL (ref 3.87–5.11)
RDW: 13.9 % (ref 11.5–15.5)
WBC: 8.1 10*3/uL (ref 4.0–10.5)

## 2016-09-02 MED ORDER — LABETALOL HCL 100 MG PO TABS
100.0000 mg | ORAL_TABLET | Freq: Once | ORAL | Status: DC
  Filled 2016-09-02: qty 1

## 2016-09-02 MED ORDER — ACETAMINOPHEN 500 MG PO TABS: 1000.0000 mg | ORAL_TABLET | Freq: Four times a day (QID) | ORAL | 0 refills | Status: DC | PRN

## 2016-09-02 MED ORDER — LABETALOL HCL 5 MG/ML IV SOLN
10.0000 mg | Freq: Once | INTRAVENOUS | Status: AC
  Administered 2016-09-02: 10 mg via INTRAVENOUS
  Filled 2016-09-02: qty 4

## 2016-09-02 MED ORDER — METOPROLOL SUCCINATE ER 25 MG PO TB24
25.0000 mg | ORAL_TABLET | Freq: Every day | ORAL | Status: DC
  Filled 2016-09-02: qty 1

## 2016-09-02 MED ORDER — POTASSIUM CHLORIDE ER 10 MEQ PO TBCR: 10.0000 meq | EXTENDED_RELEASE_TABLET | Freq: Every day | ORAL | 0 refills | Status: DC

## 2016-09-02 MED ORDER — ACETAMINOPHEN 500 MG PO TABS
1000.0000 mg | ORAL_TABLET | Freq: Once | ORAL | Status: AC
  Administered 2016-09-02: 1000 mg via ORAL
  Filled 2016-09-02: qty 2

## 2016-09-02 MED ORDER — METOPROLOL SUCCINATE ER 25 MG PO TB24: 25.0000 mg | ORAL_TABLET | Freq: Every day | ORAL | 0 refills | Status: DC

## 2016-09-02 MED ORDER — MECLIZINE HCL 25 MG PO TABS: 25.0000 mg | ORAL_TABLET | Freq: Three times a day (TID) | ORAL | 0 refills | Status: DC | PRN

## 2016-09-02 MED ORDER — MECLIZINE HCL 25 MG PO TABS
25.0000 mg | ORAL_TABLET | Freq: Once | ORAL | Status: AC
  Administered 2016-09-02: 25 mg via ORAL
  Filled 2016-09-02: qty 1

## 2016-09-02 NOTE — ED Triage Notes (Signed)
c/o dizziness x 2-3 days-states her PCP is adjusting meds for BP-tingling to left side of face, toothache left upper and pain to posterior neck

## 2016-09-02 NOTE — ED Provider Notes (Signed)
[facility] DEPT [facility] Provider Note   CSN: [id number] Arrival date & time: 09/02/16  1544     History   Chief Complaint Chief Complaint  Patient presents with  . Dizziness    HPI Sherry Price is a 46 y.o. female.  HPI Patient states she's had dizziness and headache for over a week. Dizziness has a spinning and off balance quality to it. It has been coming and going. It is made worse by changing position of her head or turning her eyes side to side. Patient reports that she is working with her primary care doctor to try to get better control over her hypertension. She reports that the symptoms seem to be associated with her blood pressure being elevated. Her doctor has her taking amlodipine 10 mg daily and hydralazine 50 mg 3 times a day. She reports they had to stop losartan because a while ago she got a lump-like swelling on her for head and one on her arm. Patient reports that she does have history of breast cancer which has been remission. She is taking tamoxifen and reports within the next year she will be cleared and will build to discontinue it. She reports since being on tamoxifen she has felt very fatigued and worn down. She does however note that because of her mastectomy the left arm sometimes gets pain and numbness sensation. In association with today's symptoms she does note that her left cheek has seemed to be a little bit numb but she also attributed that an upper tooth that is decayed. Past Medical History:  Diagnosis Date  . Arthritis   . Breast cancer ([place])    left breast - chemo and radiation  . Diabetes mellitus without complication ([place])   . Hypertension   . Malignant neoplasm of 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 ([place]) 05/21/2011   Left breast, 2 foci: 1.2 and 1.5 cm, histologic grade 3, ER 1-5%; PR 1-5%, HER-2/neu not overexpressed.  . Panic attacks   . Sleep apnea     Patient Active Problem List   Diagnosis Date Noted  . Cough with expectoration  02/27/2015  . Benign essential HTN 02/19/2015  . Heartburn   . Gastritis   . Change in bowel habits   . Benign neoplasm of ascending colon   . Anxiety 10/26/2013  . Affective bipolar disorder ([place]) 10/26/2013  . Essential (primary) hypertension 10/26/2013  . 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 Extreme obesity 10/26/2013  . Bipolar affective disorder ([place]) 10/26/2013  . Malignant neoplasm of breast ([place]) 10/26/2013  . Morbid obesity ([place]) 10/26/2013  . Diabetes mellitus ([place]) 10/26/2013  . 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 Breast cancer of upper-outer quadrant of left female breast ([place]) 05/21/2011    Past Surgical History:  Procedure Laterality Date  . BREAST LUMPECTOMY  2013  . BREAST MAMMOSITE Left February 2013  . BREAST REDUCTION SURGERY  2005  . COLONOSCOPY WITH PROPOFOL N/A 01/09/2015   Procedure: COLONOSCOPY WITH PROPOFOL;  Surgeon: Lucilla Lame, MD;  Location: [facility] ENDOSCOPY;  Service: Endoscopy;  Laterality: N/A;  . ESOPHAGOGASTRODUODENOSCOPY (EGD) WITH PROPOFOL N/A 01/09/2015   Procedure: ESOPHAGOGASTRODUODENOSCOPY (EGD) WITH PROPOFOL;  Surgeon: Lucilla Lame, MD;  Location: [facility] ENDOSCOPY;  Service: Endoscopy;  Laterality: N/A;  . OOPHORECTOMY Right 2013  . REDUCTION MAMMAPLASTY      OB History    Gravida Para Term Preterm AB Living   _0 SAB TAB Ectopic Multiple Live  Births   1              Obstetric Comments   1st Menstrual Cycle:  12  1st Pregnancy:  16       Home Medications    Prior to Admission medications   Medication Sig Start Date End Date Taking? Authorizing Provider  acetaminophen (TYLENOL) 500 MG tablet Take 2 tablets (1,000 mg total) by mouth every 6 (six) hours as needed. 09/02/16   Charlesetta Shanks, MD  amLODipine (NORVASC) 10 MG tablet Take 10 mg by mouth daily. 06/06/16   Historical Provider, MD  Aspirin-Acetaminophen-Caffeine (GOODY HEADACHE PO) Take 1 packet by mouth as needed.    Historical  Provider, MD  busPIRone (BUSPAR) 30 MG tablet TK 0.5 tab qd 05/15/15   Historical Provider, MD  cephALEXin (KEFLEX) 500 MG capsule Take 1 capsule (500 mg total) by mouth 3 (three) times daily. 07/23/16   Harvest Dark, MD  clonazePAM (KLONOPIN) 0.5 MG tablet Take 0.5 mg by mouth 2 (two) times daily as needed for anxiety.    Historical Provider, MD  DULoxetine (CYMBALTA) 60 MG capsule TK ONE C PO BID 05/06/16   Historical Provider, MD  furosemide (LASIX) 40 MG tablet TK 1 T ONCE A DAY 05/07/16   Historical Provider, MD  Ginkgo Biloba (GINKOBA PO) Take 25 mg by mouth daily.    Historical Provider, MD  hydrALAZINE (APRESOLINE) 50 MG tablet Take 50 mg by mouth 2 (two) times daily. 06/30/16   Historical Provider, MD  hydrOXYzine (ATARAX/VISTARIL) 25 MG tablet Take 1-2 tablets (25-50 mg total) by mouth every 6 (six) hours as needed for itching (may cause drowsiness). 06/26/16   John Molpus, MD  ibuprofen (ADVIL,MOTRIN) 800 MG tablet Take 800 mg by mouth every 6 (six) hours as needed.    Historical Provider, MD  lactulose (CHRONULAC) 10 GM/15ML solution TAKE 15 ML BY MOUTH TWICE DAILY AS NEEDED IF NO BOWEL MOVEMENT IN 24 HOURS, STOP IF BOWEL MOVEMENT OCCURS 11/07/15   Cammie Sickle, MD  meclizine (ANTIVERT) 25 MG tablet Take 1 tablet (25 mg total) by mouth 3 (three) times daily as needed. Patient not taking: Reported on 07/23/2016 04/22/16   Deno Etienne, DO  meclizine (ANTIVERT) 25 MG tablet Take 1 tablet (25 mg total) by mouth 3 (three) times daily as needed for dizziness. 09/02/16   Charlesetta Shanks, MD  metFORMIN (GLUCOPHAGE) 500 MG tablet Take 500 mg by mouth 2 (two) times daily with a meal.  10/23/13   Historical Provider, MD  metoprolol succinate (TOPROL-XL) 25 MG 24 hr tablet Take 1 tablet (25 mg total) by mouth daily. 09/02/16   Charlesetta Shanks, MD  omeprazole (PRILOSEC) 10 MG capsule Take 10 mg by mouth daily.    Historical Provider, MD  ondansetron (ZOFRAN ODT) 4 MG disintegrating tablet Take 1 tablet (4  mg total) by mouth every 8 (eight) hours as needed for nausea or vomiting. 12/07/14   Lucilla Lame, MD  orphenadrine (NORFLEX) 100 MG tablet TK 1 T PO  BID PRN P 05/09/16   Historical Provider, MD  potassium chloride (K-DUR) 10 MEQ tablet Take 1 tablet (10 mEq total) by mouth daily. 09/02/16   Charlesetta Shanks, MD  POTASSIUM PO Take 1 tablet by mouth 2 (two) times daily.    Historical Provider, MD  tamoxifen (NOLVADEX) 20 MG tablet Take 1 tablet (20 mg total) by mouth daily. 08/04/16   Lloyd Huger, MD  venlafaxine XR (EFFEXOR-XR) 150 MG 24 hr capsule Take 150 mg  by mouth daily with breakfast.  01/11/14   Historical Provider, MD    Family History Family History  Problem Relation Age of Onset  . Hypertension Mother   . Heart disease Mother   . Diabetes Mother   . Breast cancer Cousin     Social History Social History  Substance Use Topics  . Smoking status: Former Smoker    Years: 26.00  . Smokeless tobacco: Never Used  . Alcohol use Yes     Comment: occasionally     Allergies   Losartan   Review of Systems Review of Systems 10 Systems reviewed and are negative for acute change except as noted in the HPI.   Physical Exam Updated Vital Signs BP (!) 124/93   Pulse 74   Temp 98.2 F (36.8 C) (Oral)   Resp 18   Ht _0  (1.702 m)   Wt (!) 340 lb (154.2 kg)   LMP  (LMP Unknown)   SpO2 99%   BMI 53.25 kg/m   Physical Exam  Constitutional: She is oriented to person, place, and time. She appears well-developed and well-nourished. No distress.  Patient is alert and nontoxic. No respiratory distress. Obesity.  HENT:  Head: Normocephalic and atraumatic.  Right Ear: External ear normal.  Left Ear: External ear normal.  Nose: Nose normal.  Mouth/Throat: Oropharynx is clear and moist.  Bilateral TMs normal. Patient does have one isolated tooth in the left upper 2nd molar. Other adjacent teeth have been extracted. Tooth does not appear to have significant DKA or any drainage  discharge or swelling around it. Normal range of motion of the jaw.  Eyes: Conjunctivae and EOM are normal. Pupils are equal, round, and reactive to light.  Neck: Neck supple.  Cardiovascular: Normal rate and regular rhythm.   No murmur heard. Pulmonary/Chest: Effort normal and breath sounds normal. No respiratory distress.  Abdominal: Soft. There is no tenderness.  Musculoskeletal: She exhibits no edema.  Neurological: She is alert and oriented to person, place, and time. No cranial nerve deficit or sensory deficit. She exhibits normal muscle tone. Coordination normal.  Normal cognitive function. Normal finger-nose examination upper extremities. Normal heel shin exam lower extremities.  Skin: Skin is warm and dry. Rash noted.  Upper extremities have dry, hyperpigmented papules distributed sparsely over the upper extremities, a few are also present on the abdomen  Psychiatric: She has a normal mood and affect.  Nursing note and vitals reviewed.    ED Treatments / Results  Labs (all labs ordered are listed, but only abnormal results are displayed) Labs Reviewed  BASIC METABOLIC PANEL - Abnormal; Notable for the following:       Result Value   Potassium 3.2 (*)    Glucose, Bld 110 (*)    Calcium 8.6 (*)    All other components within normal limits  CBC  URINALYSIS, ROUTINE W REFLEX MICROSCOPIC  RPR  RAPID HIV SCREEN (HIV 1/2 AB+AG)    EKG  EKG Interpretation  Date/Time:  Tuesday Sep 02 2016 15:58:12 EDT Ventricular Rate:  92 PR Interval:    QRS Duration: 95 QT Interval:  412 QTC Calculation: 510 R Axis:   14 Text Interpretation:  Sinus rhythm Borderline prolonged QT interval Baseline wander in lead(s) V1 no change from previous Confirmed by Johnney Killian, MD, Jeannie Done [phone]) on 09/02/2016 4:01:53 PM       Radiology Ct Head Wo Contrast  Result Date: 09/02/2016 CLINICAL DATA:  Dizziness for 2 days. Left-sided facial tingling. Diabetes. EXAM: CT  HEAD WITHOUT CONTRAST TECHNIQUE:  Contiguous axial images were obtained from the base of the skull through the vertex without intravenous contrast. COMPARISON:  10/28/2015 FINDINGS: Brain: No mass lesion, hemorrhage, hydrocephalus, acute infarct, intra-axial, or extra-axial fluid collection. Vascular: No hyperdense vessel or unexpected calcification. Skull: Normal Sinuses/Orbits: Normal imaged portions of the orbits and globes. Clear paranasal sinuses and mastoid air cells. Other: None. IMPRESSION: Normal head CT. Electronically Signed   By: Abigail Miyamoto M.D.   On: 09/02/2016 17:35    Procedures Procedures (including critical care time)  Medications Ordered in ED Medications  metoprolol succinate (TOPROL-XL) 24 hr tablet 25 mg (not administered)  meclizine (ANTIVERT) tablet 25 mg (25 mg Oral Given 09/02/16 1742)  acetaminophen (TYLENOL) tablet 1,000 mg (1,000 mg Oral Given 09/02/16 1741)  labetalol (NORMODYNE,TRANDATE) injection 10 mg (10 mg Intravenous Given 09/02/16 1747)     Initial Impression / Assessment and Plan / ED Course  I have reviewed the triage vital signs and the nursing notes.  Pertinent labs & imaging results that were available during my care of the patient were reviewed by me and considered in my medical decision making (see chart for details).    Recheck 18:30. Patient reports she feels improved. She cannot turn her head side to side and eyes back and forth with much diminishment in dizziness. She reports after her labetalol, she felt that a "pressure"  Had been relieved (she cannot qualify what kind of pressure just generally improved) and she felt improved.  Final Clinical Impressions(s) / ED Diagnoses   Final diagnoses:  Vertigo  Essential hypertension  Nonintractable episodic headache, unspecified headache type   Patient presented with several symptoms. She has had ongoing dizziness which has a vertiginous quality to it all week. Patient has had vertigo diagnosis in the past. She responded well to  meclizine with significant improvement of her primary symptom of dizziness. At this time I feel that benign vertigo is most likely etiology. She has been having difficulty managing her blood pressures ever since discontinuing her ACE inhibitor and also hydrochlorothiazide. She is now taking amlodipine 10 mg once daily and hydralazine 50 mg 3 times daily. Diastolic pressures still remain elevated and heart rate was slightly elevated, at this time, I will opt to add a starting dose of Toprol as the patient had to be discontinued on ace inhibitors. She is counseled on close follow-up with her primary care physician this week to continue monitoring blood pressure and response to treatment. New Prescriptions New Prescriptions   ACETAMINOPHEN (TYLENOL) 500 MG TABLET    Take 2 tablets (1,000 mg total) by mouth every 6 (six) hours as needed.   MECLIZINE (ANTIVERT) 25 MG TABLET    Take 1 tablet (25 mg total) by mouth 3 (three) times daily as needed for dizziness.   METOPROLOL SUCCINATE (TOPROL-XL) 25 MG 24 HR TABLET    Take 1 tablet (25 mg total) by mouth daily.   POTASSIUM CHLORIDE (K-DUR) 10 MEQ TABLET    Take 1 tablet (10 mEq total) by mouth daily.     Charlesetta Shanks, MD 09/02/16 [phone]

## 2016-09-03 LAB — HIV ANTIBODY (ROUTINE TESTING W REFLEX): HIV Screen 4th Generation wRfx: NONREACTIVE

## 2016-09-03 LAB — RPR: RPR Ser Ql: NONREACTIVE

## 2016-09-08 ENCOUNTER — Ambulatory Visit
Admission: RE | Admit: 2016-09-08 | Discharge: 2016-09-08 | Disposition: A | Discharge status: Home / Self Care | Payer: Medicare Other | Source: Ambulatory Visit | Attending: Oncology | Admitting: Oncology

## 2016-09-08 DIAGNOSIS — C50412 Malignant neoplasm of upper-outer quadrant of left female breast: Secondary | ICD-10-CM | POA: Diagnosis present

## 2016-09-08 DIAGNOSIS — Z170 Estrogen receptor positive status [ER+]: Principal | ICD-10-CM

## 2016-09-08 HISTORY — DX: Personal history of antineoplastic chemotherapy: Z92.21

## 2016-09-08 HISTORY — DX: Personal history of irradiation: Z92.3

## 2016-09-16 ENCOUNTER — Emergency Department (HOSPITAL_BASED_OUTPATIENT_CLINIC_OR_DEPARTMENT_OTHER)
Admission: EM | Admit: 2016-09-16 | Discharge: 2016-09-16 | Disposition: A | Discharge status: Home / Self Care | Payer: Medicare Other | Attending: Emergency Medicine | Admitting: Emergency Medicine

## 2016-09-16 ENCOUNTER — Emergency Department (HOSPITAL_BASED_OUTPATIENT_CLINIC_OR_DEPARTMENT_OTHER): Payer: Medicare Other

## 2016-09-16 ENCOUNTER — Encounter (HOSPITAL_BASED_OUTPATIENT_CLINIC_OR_DEPARTMENT_OTHER): Payer: Self-pay | Admitting: Emergency Medicine

## 2016-09-16 DIAGNOSIS — Z853 Personal history of malignant neoplasm of breast: Secondary | ICD-10-CM | POA: Diagnosis not present

## 2016-09-16 DIAGNOSIS — E119 Type 2 diabetes mellitus without complications: Secondary | ICD-10-CM | POA: Diagnosis not present

## 2016-09-16 DIAGNOSIS — Z7984 Long term (current) use of oral hypoglycemic drugs: Secondary | ICD-10-CM | POA: Diagnosis not present

## 2016-09-16 DIAGNOSIS — Z87891 Personal history of nicotine dependence: Secondary | ICD-10-CM | POA: Diagnosis not present

## 2016-09-16 DIAGNOSIS — Z79899 Other long term (current) drug therapy: Secondary | ICD-10-CM | POA: Insufficient documentation

## 2016-09-16 DIAGNOSIS — R5383 Other fatigue: Secondary | ICD-10-CM | POA: Diagnosis present

## 2016-09-16 DIAGNOSIS — I10 Essential (primary) hypertension: Secondary | ICD-10-CM | POA: Diagnosis not present

## 2016-09-16 LAB — PREGNANCY, URINE: PREG TEST UR: NEGATIVE

## 2016-09-16 LAB — COMPREHENSIVE METABOLIC PANEL
ALK PHOS: 46 U/L (ref 38–126)
ALT: 19 U/L (ref 14–54)
AST: 25 U/L (ref 15–41)
Albumin: 3.6 g/dL (ref 3.5–5.0)
Anion gap: 10 (ref 5–15)
BUN: 10 mg/dL (ref 6–20)
CALCIUM: 8.1 mg/dL — AB (ref 8.9–10.3)
CO2: 24 mmol/L (ref 22–32)
CREATININE: 0.6 mg/dL (ref 0.44–1.00)
Chloride: 107 mmol/L (ref 101–111)
Glucose, Bld: 125 mg/dL — ABNORMAL HIGH (ref 65–99)
Potassium: 3.6 mmol/L (ref 3.5–5.1)
Sodium: 141 mmol/L (ref 135–145)
TOTAL PROTEIN: 6.5 g/dL (ref 6.5–8.1)
Total Bilirubin: 0.3 mg/dL (ref 0.3–1.2)

## 2016-09-16 LAB — CBC WITH DIFFERENTIAL/PLATELET
BASOS ABS: 0 10*3/uL (ref 0.0–0.1)
Basophils Relative: 0 %
Eosinophils Absolute: 0.2 10*3/uL (ref 0.0–0.7)
Eosinophils Relative: 2 %
HEMATOCRIT: 36 % (ref 36.0–46.0)
HEMOGLOBIN: 12 g/dL (ref 12.0–15.0)
LYMPHS PCT: 9 %
Lymphs Abs: 0.6 10*3/uL — ABNORMAL LOW (ref 0.7–4.0)
MCH: 30.5 pg (ref 26.0–34.0)
MCHC: 33.3 g/dL (ref 30.0–36.0)
MCV: 91.6 fL (ref 78.0–100.0)
MONO ABS: 0.3 10*3/uL (ref 0.1–1.0)
MONOS PCT: 4 %
NEUTROS PCT: 85 %
Neutro Abs: 5.9 10*3/uL (ref 1.7–7.7)
Platelets: 276 10*3/uL (ref 150–400)
RBC: 3.93 MIL/uL (ref 3.87–5.11)
RDW: 13.7 % (ref 11.5–15.5)
WBC: 7 10*3/uL (ref 4.0–10.5)

## 2016-09-16 LAB — URINALYSIS, ROUTINE W REFLEX MICROSCOPIC
BILIRUBIN URINE: NEGATIVE
Glucose, UA: NEGATIVE mg/dL
Hgb urine dipstick: NEGATIVE
KETONES UR: 15 mg/dL — AB
NITRITE: NEGATIVE
Protein, ur: NEGATIVE mg/dL
Specific Gravity, Urine: 1.014 (ref 1.005–1.030)
pH: 5.5 (ref 5.0–8.0)

## 2016-09-16 LAB — URINALYSIS, MICROSCOPIC (REFLEX): RBC / HPF: NONE SEEN RBC/hpf (ref 0–5)

## 2016-09-16 LAB — TROPONIN I: Troponin I: <0.03 ng/mL (ref <0.03)

## 2016-09-16 LAB — TSH: TSH: 1.244 u[IU]/mL (ref 0.350–4.500)

## 2016-09-16 MED ORDER — CALCIUM CARBONATE ANTACID 500 MG PO CHEW: 1.0000 | CHEWABLE_TABLET | Freq: Two times a day (BID) | ORAL | 0 refills | Status: AC

## 2016-09-16 NOTE — ED Triage Notes (Signed)
Patient reports weakness x 2 weeks.  Reports nausea and dizziness.  Denies chest pain, shortness of breath.

## 2016-09-16 NOTE — ED Provider Notes (Signed)
[facility] DEPT [facility] Provider Note   CSN: [id number] Arrival date & time: 09/16/16  1801  By signing my name below, I, Mayer Masker, attest that this documentation has been prepared under the direction and in the presence of Davonna Belling, MD. Electronically Signed: Mayer Masker, Scribe. 09/16/16. 7:23 PM.  History   Chief Complaint Chief Complaint  Patient presents with  . Weakness   The history is provided by the patient. No language interpreter was used.    HPI Comments: Sherry Price is a 46 y.o. female with a PMHx of breast cancer who presents to the Emergency Department complaining of gradually worsening, generalized weakness since 2 weeks. She states she is "worn out" and it feels like "2 elephants are on her back".  She has associated nausea, HA, and generalized discomfort. She denies fever, chills, cough, changes to her appetite, diarrhea, and swelling to lower extremities.    Past Medical History:  Diagnosis Date  . Arthritis   . Breast cancer ([place])    left breast - chemo and radiation  . Diabetes mellitus without complication ([place])   . Hypertension   . Malignant neoplasm of 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 ([place]) 05/21/2011   Left breast, 2 foci: 1.2 and 1.5 cm, histologic grade 3, ER 1-5%; PR 1-5%, HER-2/neu not overexpressed.  . Panic attacks   . Personal history of chemotherapy   . Personal history of radiation therapy   . Sleep apnea     Patient Active Problem List   Diagnosis Date Noted  . Cough with expectoration 02/27/2015  . Benign essential HTN 02/19/2015  . Heartburn   . Gastritis   . Change in bowel habits   . Benign neoplasm of ascending colon   . Anxiety 10/26/2013  . Affective bipolar disorder ([place]) 10/26/2013  . Essential (primary) hypertension 10/26/2013  . 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 Extreme obesity 10/26/2013  . Bipolar affective disorder ([place]) 10/26/2013  . Malignant neoplasm of  breast ([place]) 10/26/2013  . Morbid obesity ([place]) 10/26/2013  . Diabetes mellitus ([place]) 10/26/2013  . 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 Breast cancer of upper-outer quadrant of left female breast ([place]) 05/21/2011    Past Surgical History:  Procedure Laterality Date  . BREAST BIOPSY Left 2013   +  . BREAST BIOPSY Right    neg  . BREAST LUMPECTOMY  2013  . BREAST MAMMOSITE Left February 2013  . BREAST REDUCTION SURGERY  2005  . COLONOSCOPY WITH PROPOFOL N/A 01/09/2015   Procedure: COLONOSCOPY WITH PROPOFOL;  Surgeon: Lucilla Lame, MD;  Location: [facility] ENDOSCOPY;  Service: Endoscopy;  Laterality: N/A;  . ESOPHAGOGASTRODUODENOSCOPY (EGD) WITH PROPOFOL N/A 01/09/2015   Procedure: ESOPHAGOGASTRODUODENOSCOPY (EGD) WITH PROPOFOL;  Surgeon: Lucilla Lame, MD;  Location: [facility] ENDOSCOPY;  Service: Endoscopy;  Laterality: N/A;  . OOPHORECTOMY Right 2013  . REDUCTION MAMMAPLASTY      OB History    Gravida Para Term Preterm AB Living   _0 SAB TAB Ectopic Multiple Live Births   1              Obstetric Comments   1st Menstrual Cycle:  12  1st Pregnancy:  16       Home Medications    Prior to Admission medications   Medication Sig Start Date End Date Taking? Authorizing Provider  acetaminophen (TYLENOL) 500 MG tablet Take 2 tablets (1,000 mg total) by mouth every 6 (six) hours as  needed. 09/02/16   Charlesetta Shanks, MD  amLODipine (NORVASC) 10 MG tablet Take 10 mg by mouth daily. 06/06/16   [provider]  Aspirin-Acetaminophen-Caffeine (GOODY HEADACHE PO) Take 1 packet by mouth as needed.    [provider]  busPIRone (BUSPAR) 30 MG tablet TK 0.5 tab qd 05/15/15   [provider]  calcium carbonate (TUMS) 500 MG chewable tablet Chew 1 tablet (200 mg of elemental calcium total) by mouth 2 (two) times daily. 09/16/16   Davonna Belling, MD  cephALEXin (KEFLEX) 500 MG capsule Take 1 capsule (500 mg total) by mouth 3 (three) times daily. 07/23/16    Harvest Dark, MD  clonazePAM (KLONOPIN) 0.5 MG tablet Take 0.5 mg by mouth 2 (two) times daily as needed for anxiety.    [provider]  DULoxetine (CYMBALTA) 60 MG capsule TK ONE C PO BID 05/06/16   [provider]  furosemide (LASIX) 40 MG tablet TK 1 T ONCE A DAY 05/07/16   [provider]  Ginkgo Biloba (GINKOBA PO) Take 25 mg by mouth daily.    [provider]  hydrALAZINE (APRESOLINE) 50 MG tablet Take 50 mg by mouth 2 (two) times daily. 06/30/16   [provider]  hydrOXYzine (ATARAX/VISTARIL) 25 MG tablet Take 1-2 tablets (25-50 mg total) by mouth every 6 (six) hours as needed for itching (may cause drowsiness). 06/26/16   Molpus, John, MD  ibuprofen (ADVIL,MOTRIN) 800 MG tablet Take 800 mg by mouth every 6 (six) hours as needed.    [provider]  lactulose (CHRONULAC) 10 GM/15ML solution TAKE 15 ML BY MOUTH TWICE DAILY AS NEEDED IF NO BOWEL MOVEMENT IN 24 HOURS, STOP IF BOWEL MOVEMENT OCCURS 11/07/15   Cammie Sickle, MD  meclizine (ANTIVERT) 25 MG tablet Take 1 tablet (25 mg total) by mouth 3 (three) times daily as needed. Patient not taking: Reported on 07/23/2016 04/22/16   Deno Etienne, DO  meclizine (ANTIVERT) 25 MG tablet Take 1 tablet (25 mg total) by mouth 3 (three) times daily as needed for dizziness. 09/02/16   Charlesetta Shanks, MD  metFORMIN (GLUCOPHAGE) 500 MG tablet Take 500 mg by mouth 2 (two) times daily with a meal.  10/23/13   [provider]  metoprolol succinate (TOPROL-XL) 25 MG 24 hr tablet Take 1 tablet (25 mg total) by mouth daily. 09/02/16   Charlesetta Shanks, MD  omeprazole (PRILOSEC) 10 MG capsule Take 10 mg by mouth daily.    [provider]  ondansetron (ZOFRAN ODT) 4 MG disintegrating tablet Take 1 tablet (4 mg total) by mouth every 8 (eight) hours as needed for nausea or vomiting. 12/07/14   Lucilla Lame, MD  orphenadrine (NORFLEX) 100 MG tablet TK 1 T PO  BID PRN P 05/09/16   [provider]  potassium chloride (K-DUR) 10 MEQ tablet Take 1 tablet (10 mEq total) by mouth daily. 09/02/16   Charlesetta Shanks, MD  POTASSIUM PO Take 1 tablet by mouth 2 (two) times daily.    [provider]  tamoxifen (NOLVADEX) 20 MG tablet Take 1 tablet (20 mg total) by mouth daily. 08/04/16   Lloyd Huger, MD  venlafaxine XR (EFFEXOR-XR) 150 MG 24 hr capsule Take 150 mg by mouth daily with breakfast.  01/11/14   [provider]    Family History Family History  Problem Relation Age of Onset  . Hypertension Mother   . Heart disease Mother   . Diabetes Mother   . Breast cancer Cousin  Social History Social History  Substance Use Topics  . Smoking status: Former Smoker    Years: 26.00  . Smokeless tobacco: Never Used  . Alcohol use Yes     Comment: occasionally     Allergies   Losartan   Review of Systems Review of Systems  Constitutional: Negative for appetite change, chills and fever.  HENT: Negative for sore throat.   Eyes: Negative for pain and visual disturbance.  Respiratory: Negative for cough and shortness of breath.   Cardiovascular: Negative for chest pain and palpitations.  Gastrointestinal: Positive for nausea. Negative for abdominal pain, diarrhea and vomiting.  Genitourinary: Negative for dysuria and hematuria.  Musculoskeletal: Negative for arthralgias and back pain.  Skin: Negative for color change and rash.  Neurological: Positive for headaches.     Physical Exam Updated Vital Signs BP (!) 146/95 (BP Location: Right Arm)   Pulse (!) 102   Temp 98.4 F (36.9 C) (Oral)   Resp 16   Ht _0  (1.702 m)   Wt (!) 340 lb (154.2 kg)   LMP  (LMP Unknown)   SpO2 100%   BMI 53.25 kg/m   Physical Exam  Constitutional: She is oriented to person, place, and time. She appears well-developed and well-nourished.  HENT:  Head: Normocephalic and atraumatic.  Cardiovascular: Normal rate and regular rhythm.   Pulmonary/Chest: Effort normal.    Abdominal: There is no tenderness.  Musculoskeletal: She exhibits edema.  Mild bilateral lower extremity edema  Neurological: She is alert and oriented to person, place, and time.  Skin: Skin is warm and dry.  Psychiatric: She has a normal mood and affect.  Nursing note and vitals reviewed.    ED Treatments / Results  DIAGNOSTIC STUDIES: Oxygen Saturation is 100% on RA, normal by my interpretation.    COORDINATION OF CARE: 7:23 PM Discussed treatment plan with pt at bedside and pt agreed to plan. Labs (all labs ordered are listed, but only abnormal results are displayed) Labs Reviewed  URINALYSIS, ROUTINE W REFLEX MICROSCOPIC - Abnormal; Notable for the following:       Result Value   Ketones, ur 15 (*)    Leukocytes, UA TRACE (*)    All other components within normal limits  URINALYSIS, MICROSCOPIC (REFLEX) - Abnormal; Notable for the following:    Bacteria, UA RARE (*)    Squamous Epithelial / LPF 0-5 (*)    All other components within normal limits  COMPREHENSIVE METABOLIC PANEL - Abnormal; Notable for the following:    Glucose, Bld 125 (*)    Calcium 8.1 (*)    All other components within normal limits  CBC WITH DIFFERENTIAL/PLATELET - Abnormal; Notable for the following:    Lymphs Abs 0.6 (*)    All other components within normal limits  PREGNANCY, URINE  TROPONIN I  TSH    EKG  EKG Interpretation None       Radiology Dg Chest 2 View  Result Date: 09/16/2016 CLINICAL DATA:  Weakness fatigue and pain in the right leg EXAM: CHEST  2 VIEW COMPARISON:  09/30/2014 FINDINGS: The heart size and mediastinal contours are within normal limits. Both lungs are clear. The visualized skeletal structures are unremarkable. IMPRESSION: No active cardiopulmonary disease. Electronically Signed   By: Donavan Foil M.D.   On: 09/16/2016 19:42    Procedures Procedures (including critical care time)  Medications Ordered in ED Medications - No data to display   Initial  Impression / Assessment and Plan / ED Course  I have reviewed the triage vital signs and the nursing notes.  Pertinent labs & imaging results that were available during my care of the patient were reviewed by me and considered in my medical decision making (see chart for details).     Patient with fatigue. Has had some cramps. Generalized weakness for couple weeks. Some nausea. Labs reassuring but does have mild hypocalcemia. Has had the same in the past. Will supplement and will follow-up with her primary care doctor. Discharge home.  Final Clinical Impressions(s) / ED Diagnoses   Final diagnoses:  Fatigue, unspecified type  Hypocalcemia    New Prescriptions New Prescriptions   CALCIUM CARBONATE (TUMS) 500 MG CHEWABLE TABLET    Chew 1 tablet (200 mg of elemental calcium total) by mouth 2 (two) times daily.  I personally performed the services described in this documentation, which was scribed in my presence. The recorded information has been reviewed and is accurate.       Davonna Belling, MD 09/16/16 2204

## 2016-09-16 NOTE — ED Notes (Signed)
Pt states EKG has already been done. Awaiting to see the MD to notify him.

## 2016-09-16 NOTE — ED Notes (Signed)
Patient transported to X-ray 

## 2016-09-16 NOTE — ED Notes (Signed)
Pt called out for nurse asking now much longer.  States she is having leg cramps and cannot get comfortable and needs to go home. Encouraged pt to stay since all results are back and only waiting for EDP review.  Pt said she will try.

## 2016-09-19 ENCOUNTER — Encounter: Payer: Self-pay | Admitting: Emergency Medicine

## 2016-09-19 ENCOUNTER — Emergency Department
Admission: EM | Admit: 2016-09-19 | Discharge: 2016-09-19 | Disposition: A | Discharge status: Home / Self Care | Payer: Medicare Other | Attending: Emergency Medicine | Admitting: Emergency Medicine

## 2016-09-19 DIAGNOSIS — R112 Nausea with vomiting, unspecified: Secondary | ICD-10-CM | POA: Diagnosis present

## 2016-09-19 DIAGNOSIS — Z791 Long term (current) use of non-steroidal anti-inflammatories (NSAID): Secondary | ICD-10-CM | POA: Insufficient documentation

## 2016-09-19 DIAGNOSIS — Z853 Personal history of malignant neoplasm of breast: Secondary | ICD-10-CM | POA: Diagnosis not present

## 2016-09-19 DIAGNOSIS — Z7902 Long term (current) use of antithrombotics/antiplatelets: Secondary | ICD-10-CM | POA: Diagnosis not present

## 2016-09-19 DIAGNOSIS — Z87891 Personal history of nicotine dependence: Secondary | ICD-10-CM | POA: Insufficient documentation

## 2016-09-19 DIAGNOSIS — Z79899 Other long term (current) drug therapy: Secondary | ICD-10-CM | POA: Insufficient documentation

## 2016-09-19 DIAGNOSIS — Z7984 Long term (current) use of oral hypoglycemic drugs: Secondary | ICD-10-CM | POA: Diagnosis not present

## 2016-09-19 DIAGNOSIS — G8929 Other chronic pain: Secondary | ICD-10-CM

## 2016-09-19 DIAGNOSIS — E119 Type 2 diabetes mellitus without complications: Secondary | ICD-10-CM | POA: Diagnosis not present

## 2016-09-19 DIAGNOSIS — R5383 Other fatigue: Secondary | ICD-10-CM | POA: Diagnosis not present

## 2016-09-19 DIAGNOSIS — I10 Essential (primary) hypertension: Secondary | ICD-10-CM | POA: Diagnosis not present

## 2016-09-19 DIAGNOSIS — R197 Diarrhea, unspecified: Secondary | ICD-10-CM | POA: Diagnosis not present

## 2016-09-19 DIAGNOSIS — M25561 Pain in right knee: Secondary | ICD-10-CM | POA: Insufficient documentation

## 2016-09-19 LAB — COMPREHENSIVE METABOLIC PANEL
ALT: 32 U/L (ref 14–54)
ANION GAP: 8 (ref 5–15)
AST: 46 U/L — ABNORMAL HIGH (ref 15–41)
Albumin: 3.7 g/dL (ref 3.5–5.0)
Alkaline Phosphatase: 42 U/L (ref 38–126)
BUN: 8 mg/dL (ref 6–20)
CHLORIDE: 108 mmol/L (ref 101–111)
CO2: 29 mmol/L (ref 22–32)
Calcium: 9 mg/dL (ref 8.9–10.3)
Creatinine, Ser: 0.86 mg/dL (ref 0.44–1.00)
Glucose, Bld: 116 mg/dL — ABNORMAL HIGH (ref 65–99)
POTASSIUM: 3.5 mmol/L (ref 3.5–5.1)
SODIUM: 145 mmol/L (ref 135–145)
Total Bilirubin: 0.2 mg/dL — ABNORMAL LOW (ref 0.3–1.2)
Total Protein: 6.6 g/dL (ref 6.5–8.1)

## 2016-09-19 LAB — CBC
HCT: 35.2 % (ref 35.0–47.0)
Hemoglobin: 11.9 g/dL — ABNORMAL LOW (ref 12.0–16.0)
MCH: 30.9 pg (ref 26.0–34.0)
MCHC: 34 g/dL (ref 32.0–36.0)
MCV: 90.9 fL (ref 80.0–100.0)
PLATELETS: 285 10*3/uL (ref 150–440)
RBC: 3.87 MIL/uL (ref 3.80–5.20)
RDW: 15.3 % — ABNORMAL HIGH (ref 11.5–14.5)
WBC: 6 10*3/uL (ref 3.6–11.0)

## 2016-09-19 LAB — URINALYSIS, COMPLETE (UACMP) WITH MICROSCOPIC
Bilirubin Urine: NEGATIVE
GLUCOSE, UA: NEGATIVE mg/dL
Hgb urine dipstick: NEGATIVE
KETONES UR: NEGATIVE mg/dL
Nitrite: NEGATIVE
PH: 5 (ref 5.0–8.0)
PROTEIN: 30 mg/dL — AB
Specific Gravity, Urine: 1.019 (ref 1.005–1.030)

## 2016-09-19 LAB — TROPONIN I

## 2016-09-19 LAB — LIPASE, BLOOD: LIPASE: 15 U/L (ref 11–51)

## 2016-09-19 MED ORDER — ONDANSETRON 4 MG PO TBDP: ORAL_TABLET | ORAL | 0 refills | Status: DC

## 2016-09-19 MED ORDER — VENLAFAXINE HCL ER 75 MG PO CP24
150.0000 mg | ORAL_CAPSULE | Freq: Once | ORAL | Status: AC
Start: 2016-09-19 — End: 2016-09-19
  Administered 2016-09-19: 150 mg via ORAL
  Filled 2016-09-19: qty 2

## 2016-09-19 NOTE — ED Notes (Signed)
Pt reports seen Tuesday for same.   Pt states nausea and diarrhea x2-3 weeks. 1 episode vomiting today, ran out of zofran 3 days ago. Weakness and lethargy.    Pt states chronic knee pain and below the knee swelling that needs replacement but waiting on ortho MD.   Pt states recent med change to depression and BP meds by PCP and not tolerating adjustment.   Pt states reason for visit "I am just worn out and can't get all this managed at my primary doc and I just get sent home from the emergency room. I can't do it. I need to be admitted."

## 2016-09-19 NOTE — Discharge Instructions (Signed)
As we discussed, your workup today was reassuring.  Though we do not know exactly what is causing your symptoms, it appears that you have no emergent medical condition at this time and that you are safe to go home and follow up as recommended in this paperwork.  Most likely you are experiencing medication side effects, or more accurate, symptoms due to not taking medications to which your body is accustomed.  Please take all of your prescribed medications and follow up with your regular doctor to discuss all of your chronic medical issues and medications.

## 2016-09-19 NOTE — ED Notes (Signed)
MD Karma Greaser a bedside at this time.

## 2016-09-19 NOTE — ED Provider Notes (Signed)
[facility] Emergency Department Provider Note  ____________________________________________   First MD Initiated Contact with Patient 09/19/16 0502     (approximate)  I have reviewed the triage vital signs and the nursing notes.   HISTORY  Chief Complaint Emesis; Diarrhea; and Weakness    HPI Sherry Price is a 46 y.o. female with a variety of medical complaints and past medical history.  She mostly presents for evaluation of nausea and diarrhea that have been persistent for 2-3 weeks but seemed to have been worse over the last 4 days.She says that she feels generally tired and weak and reports that she has been to multiple emergency departments, multiple outpatient doctors, and states that she cannot get everything worked out and that her primary care doctor is not helping.  She refers to "her ER doctor" that, according to the medical record, she saw 2-3 days ago, who talked to her about medication changes but it was unclear exactly what changes these were.  Eventually after some discussion she did discuss that she has not been taking her venlafaxine for 4 days and that the nausea and vomiting seemed worse after that.  She also reports that she has chronic knee pain which is been going on for months and she has been to an orthopedic surgeon and knows that she needs to lose weight before they can operate on it but it is bothering her and she would like me to make the pain go away.  She denies fever/chills, chest pain, shortness of breath, abdominal pain, dysuria.  She describes all of her symptoms as severe and is frustrated at dealing with all of them and all the different doctor she has been to not been able to help her.   Past Medical History:  Diagnosis Date  . Arthritis   . Breast cancer ([place])    left breast - chemo and radiation  . Diabetes mellitus without complication ([place])   . Hypertension   . Malignant neoplasm of upper-outer quadrant of  female breast ([place]) 05/21/2011   Left breast, 2 foci: 1.2 and 1.5 cm, histologic grade 3, ER 1-5%; PR 1-5%, HER-2/neu not overexpressed.  . Panic attacks   . Personal history of chemotherapy   . Personal history of radiation therapy   . Sleep apnea     Patient Active Problem List   Diagnosis Date Noted  . Cough with expectoration 02/27/2015  . Benign essential HTN 02/19/2015  . Heartburn   . Gastritis   . Change in bowel habits   . Benign neoplasm of ascending colon   . Anxiety 10/26/2013  . Affective bipolar disorder ([place]) 10/26/2013  . Essential (primary) hypertension 10/26/2013  . 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 Extreme obesity 10/26/2013  . Bipolar affective disorder ([place]) 10/26/2013  . Malignant neoplasm of breast ([place]) 10/26/2013  . Morbid obesity ([place]) 10/26/2013  . Diabetes mellitus ([place]) 10/26/2013  . 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 Breast cancer of upper-outer quadrant of left female breast ([place]) 05/21/2011    Past Surgical History:  Procedure Laterality Date  . BREAST BIOPSY Left 2013   +  . BREAST BIOPSY Right    neg  . BREAST LUMPECTOMY  2013  . BREAST MAMMOSITE Left February 2013  . BREAST REDUCTION SURGERY  2005  . COLONOSCOPY WITH PROPOFOL N/A 01/09/2015   Procedure: COLONOSCOPY WITH PROPOFOL;  Surgeon: Lucilla Lame, MD;  Location: [facility] ENDOSCOPY;  Service: Endoscopy;  Laterality: N/A;  . ESOPHAGOGASTRODUODENOSCOPY (  EGD) WITH PROPOFOL N/A 01/09/2015   Procedure: ESOPHAGOGASTRODUODENOSCOPY (EGD) WITH PROPOFOL;  Surgeon: Lucilla Lame, MD;  Location: [facility] ENDOSCOPY;  Service: Endoscopy;  Laterality: N/A;  . OOPHORECTOMY Right 2013  . REDUCTION MAMMAPLASTY      Prior to Admission medications   Medication Sig Start Date End Date Taking? Authorizing Provider  acetaminophen (TYLENOL) 500 MG tablet Take 2 tablets (1,000 mg total) by mouth every 6 (six) hours as needed. 09/02/16   Charlesetta Shanks, MD  amLODipine (NORVASC)  10 MG tablet Take 10 mg by mouth daily. 06/06/16   [provider]  Aspirin-Acetaminophen-Caffeine (GOODY HEADACHE PO) Take 1 packet by mouth as needed.    [provider]  busPIRone (BUSPAR) 30 MG tablet TK 0.5 tab qd 05/15/15   [provider]  calcium carbonate (TUMS) 500 MG chewable tablet Chew 1 tablet (200 mg of elemental calcium total) by mouth 2 (two) times daily. 09/16/16   Davonna Belling, MD  cephALEXin (KEFLEX) 500 MG capsule Take 1 capsule (500 mg total) by mouth 3 (three) times daily. 07/23/16   Harvest Dark, MD  clonazePAM (KLONOPIN) 0.5 MG tablet Take 0.5 mg by mouth 2 (two) times daily as needed for anxiety.    [provider]  DULoxetine (CYMBALTA) 60 MG capsule TK ONE C PO BID 05/06/16   [provider]  furosemide (LASIX) 40 MG tablet TK 1 T ONCE A DAY 05/07/16   [provider]  Ginkgo Biloba (GINKOBA PO) Take 25 mg by mouth daily.    [provider]  hydrALAZINE (APRESOLINE) 50 MG tablet Take 50 mg by mouth 2 (two) times daily. 06/30/16   [provider]  hydrOXYzine (ATARAX/VISTARIL) 25 MG tablet Take 1-2 tablets (25-50 mg total) by mouth every 6 (six) hours as needed for itching (may cause drowsiness). 06/26/16   Molpus, John, MD  ibuprofen (ADVIL,MOTRIN) 800 MG tablet Take 800 mg by mouth every 6 (six) hours as needed.    [provider]  lactulose (CHRONULAC) 10 GM/15ML solution TAKE 15 ML BY MOUTH TWICE DAILY AS NEEDED IF NO BOWEL MOVEMENT IN 24 HOURS, STOP IF BOWEL MOVEMENT OCCURS 11/07/15   Cammie Sickle, MD  meclizine (ANTIVERT) 25 MG tablet Take 1 tablet (25 mg total) by mouth 3 (three) times daily as needed. Patient not taking: Reported on 07/23/2016 04/22/16   Deno Etienne, DO  meclizine (ANTIVERT) 25 MG tablet Take 1 tablet (25 mg total) by mouth 3 (three) times daily as needed for dizziness. 09/02/16   Charlesetta Shanks, MD  metFORMIN (GLUCOPHAGE) 500 MG tablet Take 500 mg by mouth 2 (two)  times daily with a meal.  10/23/13   [provider]  metoprolol succinate (TOPROL-XL) 25 MG 24 hr tablet Take 1 tablet (25 mg total) by mouth daily. 09/02/16   Charlesetta Shanks, MD  omeprazole (PRILOSEC) 10 MG capsule Take 10 mg by mouth daily.    [provider]  ondansetron (ZOFRAN ODT) 4 MG disintegrating tablet Allow 1-2 tablets to dissolve in your mouth every 8 hours as needed for nausea/vomiting 09/19/16   Hinda Kehr, MD  orphenadrine (NORFLEX) 100 MG tablet TK 1 T PO  BID PRN P 05/09/16   [provider]  potassium chloride (K-DUR) 10 MEQ tablet Take 1 tablet (10 mEq total) by mouth daily. 09/02/16   Charlesetta Shanks, MD  POTASSIUM PO Take 1 tablet by mouth 2 (two) times daily.    [provider]  tamoxifen (NOLVADEX) 20 MG tablet Take 1 tablet (  20 mg total) by mouth daily. 08/04/16   Lloyd Huger, MD  venlafaxine XR (EFFEXOR-XR) 150 MG 24 hr capsule Take 150 mg by mouth daily with breakfast.  01/11/14   [provider]    Allergies Losartan  Family History  Problem Relation Age of Onset  . Hypertension Mother   . Heart disease Mother   . Diabetes Mother   . Breast cancer Cousin     Social History Social History  Substance Use Topics  . Smoking status: Former Smoker    Years: 26.00  . Smokeless tobacco: Never Used  . Alcohol use Yes     Comment: occasionally    Review of Systems Constitutional: No fever/chills.  Generalized weakness and fatigue Eyes: No visual changes. ENT: No sore throat. Cardiovascular: Denies chest pain. Respiratory: Denies shortness of breath. Gastrointestinal: No abdominal pain.  Nausea and vomiting and diarrhea for 2-3 weeks, worse over the last 4 days Genitourinary: Negative for dysuria. Musculoskeletal: Negative for neck pain.  Negative for back pain.  Chronic right knee pain Integumentary: Negative for rash. Neurological: Negative for headaches, focal weakness or  numbness.   ____________________________________________   PHYSICAL EXAM:  VITAL SIGNS: ED Triage Vitals [09/19/16 0128]  Enc Vitals Group     BP 140/89     Pulse Rate 97     Resp 20     Temp 97.5 F (36.4 C)     Temp Source Oral     SpO2 99 %     Weight (!) 345 lb (156.5 kg)     Height '5\' 7"'$  (1.702 m)     Head Circumference      Peak Flow      Pain Score 7     Pain Loc      Pain Edu?      Excl. in [place]?     Constitutional: Alert and oriented. Well appearing and in no acute distress. Eyes: Conjunctivae are normal.  Head: Atraumatic. Nose: No congestion/rhinnorhea. Mouth/Throat: Mucous membranes are moist. Neck: No stridor.  No meningeal signs.   Cardiovascular: Normal rate, regular rhythm. Good peripheral circulation. Grossly normal heart sounds. Respiratory: Normal respiratory effort.  No retractions. Lungs CTAB. Gastrointestinal: Morbid obesity.  Soft and nontender. No distention.  Musculoskeletal: No lower extremity tenderness nor edema. No gross deformities of extremities.  Ambulatory without difficulty Neurologic:  Normal speech and language. No gross focal neurologic deficits are appreciated.  Skin:  Skin is warm, dry and intact. No rash noted. Psychiatric: Mood and affect are normal. Speech and behavior are normal.  ____________________________________________   LABS (all labs ordered are listed, but only abnormal results are displayed)  Labs Reviewed  CBC - Abnormal; Notable for the following:       Result Value   Hemoglobin 11.9 (*)    RDW 15.3 (*)    All other components within normal limits  URINALYSIS, COMPLETE (UACMP) WITH MICROSCOPIC - Abnormal; Notable for the following:    Color, Urine YELLOW (*)    APPearance HAZY (*)    Protein, ur 30 (*)    Leukocytes, UA SMALL (*)    Bacteria, UA RARE (*)    Squamous Epithelial / LPF 0-5 (*)    All other components within normal limits  COMPREHENSIVE METABOLIC PANEL - Abnormal; Notable for the following:     Glucose, Bld 116 (*)    AST 46 (*)    Total Bilirubin 0.2 (*)    All other components within normal limits  TROPONIN I  LIPASE, BLOOD   ____________________________________________  EKG  ED ECG REPORT I, Zaire Levesque, the attending physician, personally viewed and interpreted this ECG.  Date: 09/19/2016 EKG Time: 1:32 AM Rate: 94 Rhythm: normal sinus rhythm QRS Axis: normal Intervals: normal ST/T Wave abnormalities: normal Conduction Disturbances: none Narrative Interpretation: unremarkable  ____________________________________________  RADIOLOGY   No results found.  ____________________________________________   PROCEDURES  Critical Care performed: No   Procedure(s) performed:   Procedures   ____________________________________________   INITIAL IMPRESSION / ASSESSMENT AND PLAN / ED COURSE  Pertinent labs & imaging results that were available during my care of the patient were reviewed by me and considered in my medical decision making (see chart for details).  The patient has no abdominal tenderness to palpation and her lungs are clear.  I had a lengthy conversation with her about all of her medications and her chronic medical issues and I was reassuring in that she does not appear to have any acute medical problems that I can address in the emergency department.  I encouraged her to continue taking her venlafaxine and we discussed the possibility that not taking it may be leading to nausea or vomiting, but I again explained that the emergency department is not the best place to work out these chronic issues, medication changes, etc.  She was displeased but seemed to understand and acknowledge what I was telling her.  Because of the possibility that not taking her Effexor was causing the nausea she requested we give her a dose of medication here which I did.  I also refilled her Zofran prescription.  I encouraged her to follow up with her primary care doctor  as soon as possible.  I gave my usual and customary return precautions.         ____________________________________________  FINAL CLINICAL IMPRESSION(S) / ED DIAGNOSES  Final diagnoses:  Nausea vomiting and diarrhea  Chronic pain of right knee  Other fatigue     MEDICATIONS GIVEN DURING THIS VISIT:  Medications  venlafaxine XR (EFFEXOR-XR) 24 hr capsule 150 mg (150 mg Oral Given 09/19/16 0555)     NEW OUTPATIENT MEDICATIONS STARTED DURING THIS VISIT:  Discharge Medication List as of 09/19/2016  5:19 AM      Discharge Medication List as of 09/19/2016  5:19 AM    CONTINUE these medications which have CHANGED   Details  ondansetron (ZOFRAN ODT) 4 MG disintegrating tablet Allow 1-2 tablets to dissolve in your mouth every 8 hours as needed for nausea/vomiting, Print        Discharge Medication List as of 09/19/2016  5:19 AM       Note:  This document was prepared using Dragon voice recognition software and may include unintentional dictation errors.    Hinda Kehr, MD 09/19/16 [phone]

## 2016-09-19 NOTE — ED Notes (Signed)
PT provided socks and larger gown per request upon entering room.

## 2016-09-19 NOTE — ED Notes (Signed)

## 2016-09-19 NOTE — ED Triage Notes (Signed)
Pt to triage via [place] via EMS from home, report diarrhea x 2 weeks, vomiting tonight, right knee pain which is chronic but tonight worse.  Pt reports generalized weakness as well, and right flank pain.  Pt reports hospitalized for similar sx recently.  Pt also notes she has not taken her effexor in 2 weeks.

## 2016-10-16 DIAGNOSIS — R600 Localized edema: Secondary | ICD-10-CM | POA: Insufficient documentation

## 2016-10-28 ENCOUNTER — Telehealth: Payer: Self-pay | Admitting: *Deleted

## 2016-10-28 NOTE — Telephone Encounter (Signed)
Called to request a prescription for Prosthetic and mastectomy bras, Will pick up Rx after lunch. Rx ready for pick up

## 2016-11-25 NOTE — Progress Notes (Signed)
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 Patient appeared to have 2 adjacent primaries.  INTERVAL HISTORY: Patient returns to clinic today for six-month evaluation.  She has chronic weakness and fatigue, but otherwise feels well. She has no neurologic complaints. She denies any recent fevers. She has no chest pain or shortness of breath. She has a good appetite and denies weight loss. She denies any nausea, vomiting, constipation, or diarrhea. She has no urinary complaints. Patient otherwise feels well and offers no further specific complaints.  REVIEW OF SYSTEMS:   Review of Systems  Constitutional: Positive for malaise/fatigue. Negative for fever and weight loss.  Respiratory: Negative for cough and shortness of breath.   Cardiovascular: Negative.  Negative for chest pain and leg swelling.  Gastrointestinal: Negative.  Negative for abdominal pain, blood in stool, constipation, diarrhea, melena, nausea and vomiting.  Genitourinary: Negative.   Musculoskeletal: Negative.   Skin: Negative.  Negative for rash.  Neurological: Positive for weakness. Negative for dizziness.  Psychiatric/Behavioral: Negative.  The patient is not nervous/anxious and does not have insomnia.     As per HPI. Otherwise, a complete review of systems is negative.  PAST MEDICAL HISTORY: Past Medical History:  Diagnosis Date  . Arthritis   . Breast cancer ([place])    left breast - chemo and radiation  . Diabetes mellitus without complication ([place])   .  Hypertension   . Malignant neoplasm of 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 ([place]) 05/21/2011   Left breast, 2 foci: 1.2 and 1.5 cm, histologic grade 3, ER 1-5%; PR 1-5%, HER-2/neu not overexpressed.  . Panic attacks   . Personal history of chemotherapy   . Personal history of radiation therapy   . Sleep apnea     PAST SURGICAL HISTORY: Past Surgical History:  Procedure Laterality Date  . BREAST BIOPSY Left 2013   +  . BREAST BIOPSY Right    neg  . BREAST LUMPECTOMY  2013  . BREAST MAMMOSITE Left February 2013  . BREAST REDUCTION SURGERY  2005  . COLONOSCOPY WITH PROPOFOL N/A 01/09/2015   Procedure: COLONOSCOPY WITH PROPOFOL;  Surgeon: Lucilla Lame, MD;  Location: [facility] ENDOSCOPY;  Service: Endoscopy;  Laterality: N/A;  . ESOPHAGOGASTRODUODENOSCOPY (EGD) WITH PROPOFOL N/A 01/09/2015   Procedure: ESOPHAGOGASTRODUODENOSCOPY (EGD) WITH PROPOFOL;  Surgeon: Lucilla Lame, MD;  Location: [facility] ENDOSCOPY;  Service: Endoscopy;  Laterality: N/A;  . OOPHORECTOMY Right 2013  . REDUCTION MAMMAPLASTY      FAMILY HISTORY: Family History  Problem Relation Age of Onset  . Hypertension Mother   . Heart disease Mother   . Diabetes Mother   . Breast cancer Cousin        ADVANCED DIRECTIVES:    HEALTH MAINTENANCE: Social History  Substance Use Topics  . Smoking status: Former Smoker    Years: 26.00  . Smokeless tobacco: Never Used  . Alcohol use Yes     Comment: occasionally     Colonoscopy:  PAP:  Bone density:  Lipid panel:  Allergies  Allergen Reactions  . Losartan Hives    Current Outpatient Prescriptions  Medication Sig Dispense Refill  . acetaminophen (TYLENOL)  500 MG tablet Take 2 tablets (1,000 mg total) by mouth every 6 (six) hours as needed. 30 tablet 0  . amLODipine (NORVASC) 10 MG tablet Take 10 mg by mouth daily.  3  . Aspirin-Acetaminophen-Caffeine (GOODY HEADACHE PO) Take 1 packet by mouth as needed.    Marland Kitchen buPROPion (WELLBUTRIN) 100 MG tablet Take 100 mg by mouth 2  (two) times daily.    . calcium carbonate (TUMS) 500 MG chewable tablet Chew 1 tablet (200 mg of elemental calcium total) by mouth 2 (two) times daily. 20 tablet 0  . clonazePAM (KLONOPIN) 0.5 MG tablet Take 0.5 mg by mouth 2 (two) times daily as needed for anxiety.    . hydrALAZINE (APRESOLINE) 50 MG tablet Take 50 mg by mouth 2 (two) times daily.  3  . hydrochlorothiazide (HYDRODIURIL) 25 MG tablet Take by mouth.    Marland Kitchen ibuprofen (ADVIL,MOTRIN) 800 MG tablet Take 800 mg by mouth every 6 (six) hours as needed.    . lactulose (CHRONULAC) 10 GM/15ML solution TAKE 15 ML BY MOUTH TWICE DAILY AS NEEDED IF NO BOWEL MOVEMENT IN 24 HOURS, STOP IF BOWEL MOVEMENT OCCURS 450 mL 0  . metFORMIN (GLUCOPHAGE) 500 MG tablet Take 500 mg by mouth 2 (two) times daily with a meal.     . metoprolol succinate (TOPROL-XL) 25 MG 24 hr tablet Take 1 tablet (25 mg total) by mouth daily. 30 tablet 0  . ondansetron (ZOFRAN ODT) 4 MG disintegrating tablet Allow 1-2 tablets to dissolve in your mouth every 8 hours as needed for nausea/vomiting 30 tablet 0  . tamoxifen (NOLVADEX) 20 MG tablet Take 1 tablet (20 mg total) by mouth daily. 90 tablet 1  . venlafaxine XR (EFFEXOR-XR) 150 MG 24 hr capsule Take 150 mg by mouth daily with breakfast.     . cephALEXin (KEFLEX) 500 MG capsule Take 1 capsule (500 mg total) by mouth 3 (three) times daily. (Patient not taking: Reported on 11/26/2016) 30 capsule 0  . hydrOXYzine (ATARAX/VISTARIL) 25 MG tablet Take 1-2 tablets (25-50 mg total) by mouth every 6 (six) hours as needed for itching (may cause drowsiness). (Patient not taking: Reported on 11/26/2016) 30 tablet 0  . omeprazole (PRILOSEC) 10 MG capsule Take 10 mg by mouth daily.    . orphenadrine (NORFLEX) 100 MG tablet TK 1 T PO  BID PRN P  2   No current facility-administered medications for this visit.     OBJECTIVE: Vitals:   11/26/16 1416  BP: 128/89  Pulse: 86  Resp: 20  Temp: (!) 97.3 F (36.3 C)     Body mass index is  53.72 kg/m.    ECOG FS:0 - Asymptomatic  General: Well-developed, well-nourished, no acute distress. Eyes: Pink conjunctiva, anicteric sclera. Breasts: Exam deferred today. Lungs: Clear to auscultation bilaterally. Heart: Regular rate and rhythm. No rubs, murmurs, or gallops. Abdomen: Soft, nontender, nondistended. No organomegaly noted, normoactive bowel sounds. Musculoskeletal: No edema, cyanosis, or clubbing. Neuro: Alert, answering all questions appropriately. Cranial nerves grossly intact. Skin: No rashes or petechiae noted. Psych: Pt has sad affect, appearing subdued.   LAB RESULTS:  Lab Results  Component Value Date   NA 145 09/19/2016   K 3.5 09/19/2016   CL 108 09/19/2016   CO2 29 09/19/2016   GLUCOSE 116 (H) 09/19/2016   BUN 8 09/19/2016   CREATININE 0.86 09/19/2016   CALCIUM 9.0 09/19/2016   PROT 6.6 09/19/2016   ALBUMIN 3.7 09/19/2016   AST 46 (H) 09/19/2016   ALT 32  09/19/2016   ALKPHOS 42 09/19/2016   BILITOT 0.2 (L) 09/19/2016   GFRNONAA >60 09/19/2016   GFRAA >60 09/19/2016    Lab Results  Component Value Date   WBC 6.0 09/19/2016   NEUTROABS 5.9 09/16/2016   HGB 11.9 (L) 09/19/2016   HCT 35.2 09/19/2016   MCV 90.9 09/19/2016   PLT 285 09/19/2016     STUDIES: No results found.  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 Patient appeared to have 2 adjacent primaries.  PLAN:    1. 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 Patient had lumpectomy and MammoSite treatment in February 2013. She also received adjuvant chemotherapy. Patient has now completed 5 years of tamoxifen and was instructed to complete her current prescription and discontinue. Patient's most recent mammogram on Sep 08, 2016 was reported as  BI-RADS 2. Repeat mammogram in May 2019. Return to clinic in 1 year for routine evaluation.  Approximately 20 minutes was spent in discussion of which greater than 50% was consultation.     Patient expressed understanding and was in agreement with this plan. She also understands that She can call clinic at any time with any questions, concerns, or complaints.   Lloyd Huger, MD 11/28/16 1:09 PM

## 2016-11-26 ENCOUNTER — Inpatient Hospital Stay: Payer: Medicare Other | Attending: Oncology | Admitting: Oncology

## 2016-11-26 VITALS — BP 128/89 | HR 86 | Temp 97.3°F | Resp 20 | Wt 343.0 lb

## 2016-11-26 DIAGNOSIS — Z9221 Personal history of antineoplastic chemotherapy: Secondary | ICD-10-CM | POA: Diagnosis not present

## 2016-11-26 DIAGNOSIS — R531 Weakness: Secondary | ICD-10-CM | POA: Insufficient documentation

## 2016-11-26 DIAGNOSIS — C50412 Malignant neoplasm of upper-outer quadrant of left female breast: Secondary | ICD-10-CM | POA: Diagnosis not present

## 2016-11-26 DIAGNOSIS — G4730 Sleep apnea, unspecified: Secondary | ICD-10-CM | POA: Insufficient documentation

## 2016-11-26 DIAGNOSIS — Z803 Family history of malignant neoplasm of breast: Secondary | ICD-10-CM | POA: Insufficient documentation

## 2016-11-26 DIAGNOSIS — Z79810 Long term (current) use of selective estrogen receptor modulators (SERMs): Secondary | ICD-10-CM | POA: Diagnosis not present

## 2016-11-26 DIAGNOSIS — Z170 Estrogen receptor positive status [ER+]: Secondary | ICD-10-CM | POA: Diagnosis not present

## 2016-11-26 DIAGNOSIS — Z7984 Long term (current) use of oral hypoglycemic drugs: Secondary | ICD-10-CM | POA: Diagnosis not present

## 2016-11-26 DIAGNOSIS — Z79899 Other long term (current) drug therapy: Secondary | ICD-10-CM | POA: Diagnosis not present

## 2016-11-26 DIAGNOSIS — E119 Type 2 diabetes mellitus without complications: Secondary | ICD-10-CM | POA: Diagnosis not present

## 2016-11-26 DIAGNOSIS — Z923 Personal history of irradiation: Secondary | ICD-10-CM | POA: Diagnosis not present

## 2016-11-26 DIAGNOSIS — Z87891 Personal history of nicotine dependence: Secondary | ICD-10-CM | POA: Diagnosis not present

## 2016-11-26 DIAGNOSIS — M129 Arthropathy, unspecified: Secondary | ICD-10-CM | POA: Insufficient documentation

## 2016-11-26 DIAGNOSIS — R5383 Other fatigue: Secondary | ICD-10-CM | POA: Diagnosis not present

## 2016-11-26 DIAGNOSIS — I10 Essential (primary) hypertension: Secondary | ICD-10-CM | POA: Diagnosis not present

## 2016-11-26 NOTE — Progress Notes (Signed)
Patient is here today for follow up, she mentions she is very fatigued.

## 2017-09-09 ENCOUNTER — Other Ambulatory Visit: Payer: Medicare Other

## 2017-11-23 NOTE — Progress Notes (Deleted)
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 Patient appeared to have 2 adjacent primaries.  INTERVAL HISTORY: Patient returns to clinic today for six-month evaluation.  She has chronic weakness and fatigue, but otherwise feels well. She has no neurologic complaints. She denies any recent fevers. She has no chest pain or shortness of breath. She has a good appetite and denies weight loss. She denies any nausea, vomiting, constipation, or diarrhea. She has no urinary complaints. Patient otherwise feels well and offers no further specific complaints.  REVIEW OF SYSTEMS:   Review of Systems  Constitutional: Positive for malaise/fatigue. Negative for fever and weight loss.  Respiratory: Negative for cough and shortness of breath.   Cardiovascular: Negative.  Negative for chest pain and leg swelling.  Gastrointestinal: Negative.  Negative for abdominal pain, blood in stool, constipation, diarrhea, melena, nausea and vomiting.  Genitourinary: Negative.   Musculoskeletal: Negative.   Skin: Negative.  Negative for rash.  Neurological: Positive for weakness. Negative for dizziness.  Psychiatric/Behavioral: Negative.  The patient is not nervous/anxious and does not have insomnia.     As per HPI. Otherwise, a complete review of systems is negative.  PAST MEDICAL HISTORY: Past Medical History:  Diagnosis Date  . Arthritis   . Breast cancer ([place])    left breast - chemo and radiation  . Diabetes mellitus without complication ([place])     . Hypertension   . Malignant neoplasm of 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 ([place]) 05/21/2011   Left breast, 2 foci: 1.2 and 1.5 cm, histologic grade 3, ER 1-5%; PR 1-5%, HER-2/neu not overexpressed.  . Panic attacks   . Personal history of chemotherapy   . Personal history of radiation therapy   . Sleep apnea     PAST SURGICAL HISTORY: Past Surgical History:  Procedure Laterality Date  . BREAST BIOPSY Left 2013   +  . BREAST BIOPSY Right    neg  . BREAST LUMPECTOMY  2013  . BREAST MAMMOSITE Left February 2013  . BREAST REDUCTION SURGERY  2005  . COLONOSCOPY WITH PROPOFOL N/A 01/09/2015   Procedure: COLONOSCOPY WITH PROPOFOL;  Surgeon: Lucilla Lame, MD;  Location: [facility] ENDOSCOPY;  Service: Endoscopy;  Laterality: N/A;  . ESOPHAGOGASTRODUODENOSCOPY (EGD) WITH PROPOFOL N/A 01/09/2015   Procedure: ESOPHAGOGASTRODUODENOSCOPY (EGD) WITH PROPOFOL;  Surgeon: Lucilla Lame, MD;  Location: [facility] ENDOSCOPY;  Service: Endoscopy;  Laterality: N/A;  . OOPHORECTOMY Right 2013  . REDUCTION MAMMAPLASTY      FAMILY HISTORY: Family History  Problem Relation Age of Onset  . Hypertension Mother   . Heart disease Mother   . Diabetes Mother   . Breast cancer Cousin        ADVANCED DIRECTIVES:    HEALTH MAINTENANCE: Social History   Tobacco Use  . Smoking status: Former Smoker    Years: 26.00  . Smokeless tobacco: Never Used  Substance Use Topics  . Alcohol use: Yes    Comment: occasionally  . Drug use: No     Colonoscopy:  PAP:  Bone density:  Lipid panel:  Allergies  Allergen Reactions  . Losartan Hives    Current Outpatient  Medications  Medication Sig Dispense Refill  . acetaminophen (TYLENOL) 500 MG tablet Take 2 tablets (1,000 mg total) by mouth every 6 (six) hours as needed. 30 tablet 0  . amLODipine (NORVASC) 10 MG tablet Take 10 mg by mouth daily.  3  . Aspirin-Acetaminophen-Caffeine (GOODY HEADACHE PO) Take 1 packet by mouth as needed.    Marland Kitchen buPROPion (WELLBUTRIN) 100  MG tablet Take 100 mg by mouth 2 (two) times daily.    . calcium carbonate (TUMS) 500 MG chewable tablet Chew 1 tablet (200 mg of elemental calcium total) by mouth 2 (two) times daily. 20 tablet 0  . cephALEXin (KEFLEX) 500 MG capsule Take 1 capsule (500 mg total) by mouth 3 (three) times daily. (Patient not taking: Reported on 11/26/2016) 30 capsule 0  . clonazePAM (KLONOPIN) 0.5 MG tablet Take 0.5 mg by mouth 2 (two) times daily as needed for anxiety.    . hydrALAZINE (APRESOLINE) 50 MG tablet Take 50 mg by mouth 2 (two) times daily.  3  . hydrochlorothiazide (HYDRODIURIL) 25 MG tablet Take by mouth.    . hydrOXYzine (ATARAX/VISTARIL) 25 MG tablet Take 1-2 tablets (25-50 mg total) by mouth every 6 (six) hours as needed for itching (may cause drowsiness). (Patient not taking: Reported on 11/26/2016) 30 tablet 0  . ibuprofen (ADVIL,MOTRIN) 800 MG tablet Take 800 mg by mouth every 6 (six) hours as needed.    . lactulose (CHRONULAC) 10 GM/15ML solution TAKE 15 ML BY MOUTH TWICE DAILY AS NEEDED IF NO BOWEL MOVEMENT IN 24 HOURS, STOP IF BOWEL MOVEMENT OCCURS 450 mL 0  . metFORMIN (GLUCOPHAGE) 500 MG tablet Take 500 mg by mouth 2 (two) times daily with a meal.     . metoprolol succinate (TOPROL-XL) 25 MG 24 hr tablet Take 1 tablet (25 mg total) by mouth daily. 30 tablet 0  . omeprazole (PRILOSEC) 10 MG capsule Take 10 mg by mouth daily.    . ondansetron (ZOFRAN ODT) 4 MG disintegrating tablet Allow 1-2 tablets to dissolve in your mouth every 8 hours as needed for nausea/vomiting 30 tablet 0  . orphenadrine (NORFLEX) 100 MG tablet TK 1 T PO  BID PRN P  2  . tamoxifen (NOLVADEX) 20 MG tablet Take 1 tablet (20 mg total) by mouth daily. 90 tablet 1  . venlafaxine XR (EFFEXOR-XR) 150 MG 24 hr capsule Take 150 mg by mouth daily with breakfast.      No current facility-administered medications for this visit.     OBJECTIVE: There were no vitals filed for this visit.   There is no height or weight on file to  calculate BMI.    ECOG FS:0 - Asymptomatic  General: Well-developed, well-nourished, no acute distress. Eyes: Pink conjunctiva, anicteric sclera. Breasts: Exam deferred today. Lungs: Clear to auscultation bilaterally. Heart: Regular rate and rhythm. No rubs, murmurs, or gallops. Abdomen: Soft, nontender, nondistended. No organomegaly noted, normoactive bowel sounds. Musculoskeletal: No edema, cyanosis, or clubbing. Neuro: Alert, answering all questions appropriately. Cranial nerves grossly intact. Skin: No rashes or petechiae noted. Psych: Pt has sad affect, appearing subdued.   LAB RESULTS:  Lab Results  Component Value Date   NA 145 09/19/2016   K 3.5 09/19/2016   CL 108 09/19/2016   CO2 29 09/19/2016   GLUCOSE 116 (H) 09/19/2016   BUN 8 09/19/2016   CREATININE 0.86 09/19/2016   CALCIUM 9.0 09/19/2016   PROT 6.6 09/19/2016   ALBUMIN 3.7 09/19/2016   AST 46 (H) 09/19/2016   ALT 32  09/19/2016   ALKPHOS 42 09/19/2016   BILITOT 0.2 (L) 09/19/2016   GFRNONAA >60 09/19/2016   GFRAA >60 09/19/2016    Lab Results  Component Value Date   WBC 6.0 09/19/2016   NEUTROABS 5.9 09/16/2016   HGB 11.9 (L) 09/19/2016   HCT 35.2 09/19/2016   MCV 90.9 09/19/2016   PLT 285 09/19/2016     STUDIES: No results found.  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 Patient appeared to have 2 adjacent primaries.  PLAN:    1. 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 Patient had lumpectomy and MammoSite treatment in February 2013. She also received adjuvant chemotherapy. Patient has now completed 5 years of tamoxifen and was instructed to complete her current prescription and discontinue. Patient's most recent mammogram on Sep 08, 2016 was reported as  BI-RADS 2. Repeat mammogram in May 2019. Return to clinic in 1 year for routine evaluation.  Approximately 20 minutes was spent in discussion of which greater than 50% was consultation.     Patient expressed understanding and was in agreement with this plan. She also understands that She can call clinic at any time with any questions, concerns, or complaints.   Lloyd Huger, MD 11/23/17 11:53 PM

## 2017-11-26 ENCOUNTER — Inpatient Hospital Stay: Payer: Medicare Other | Admitting: Oncology

## 2018-05-17 IMAGING — MG 2D DIGITAL DIAGNOSTIC BILATERAL MAMMOGRAM WITH CAD AND ADJUNCT T
8 of 20 series · 8 of 40 positions shown · non-contrast
Comparison: Previous exam(s).

CLINICAL DATA: 45-year-old female with history of left lumpectomy
in 2123. Routine exam.

EXAM:
2D DIGITAL DIAGNOSTIC BILATERAL MAMMOGRAM WITH CAD AND ADJUNCT TOMO

[R CV]
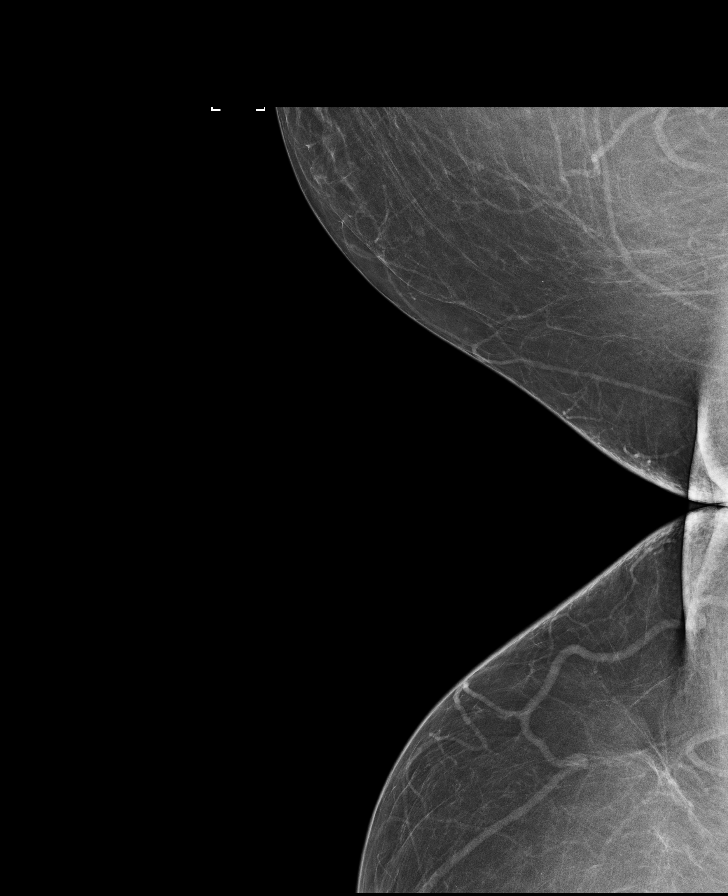

[L MLO]
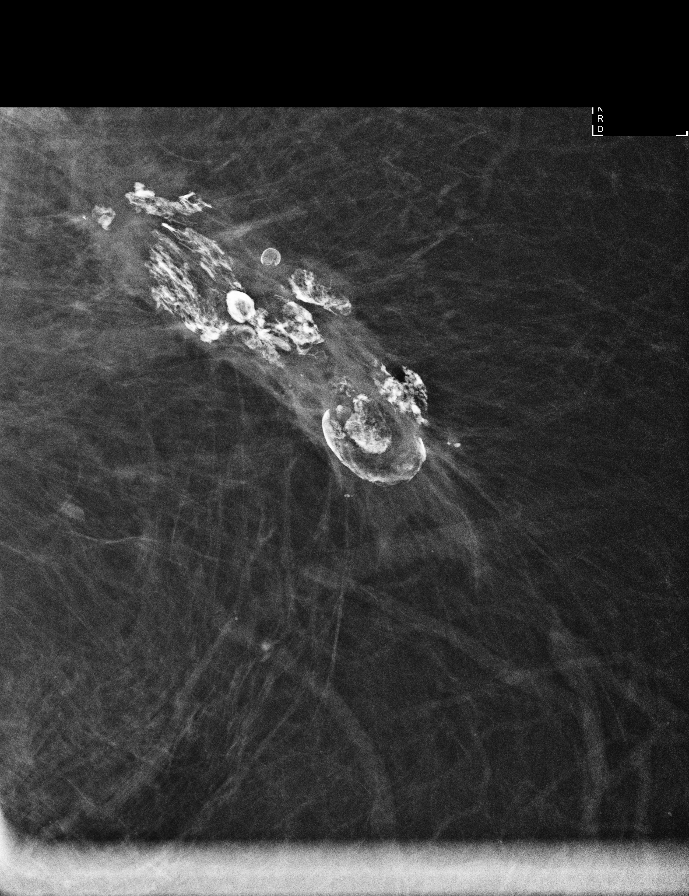

[R MLO synth-2D]
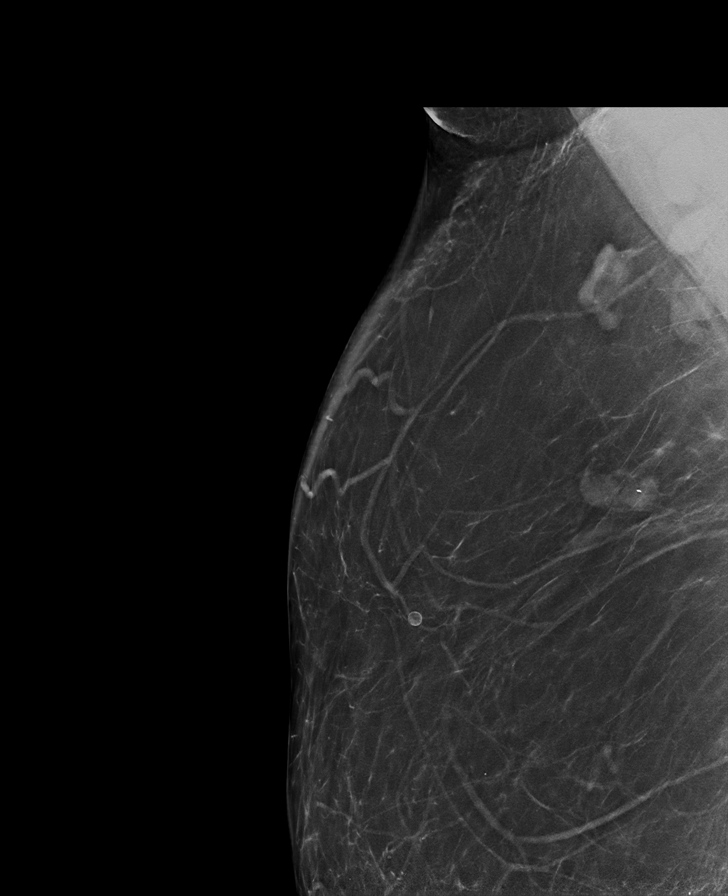

[R MLO]
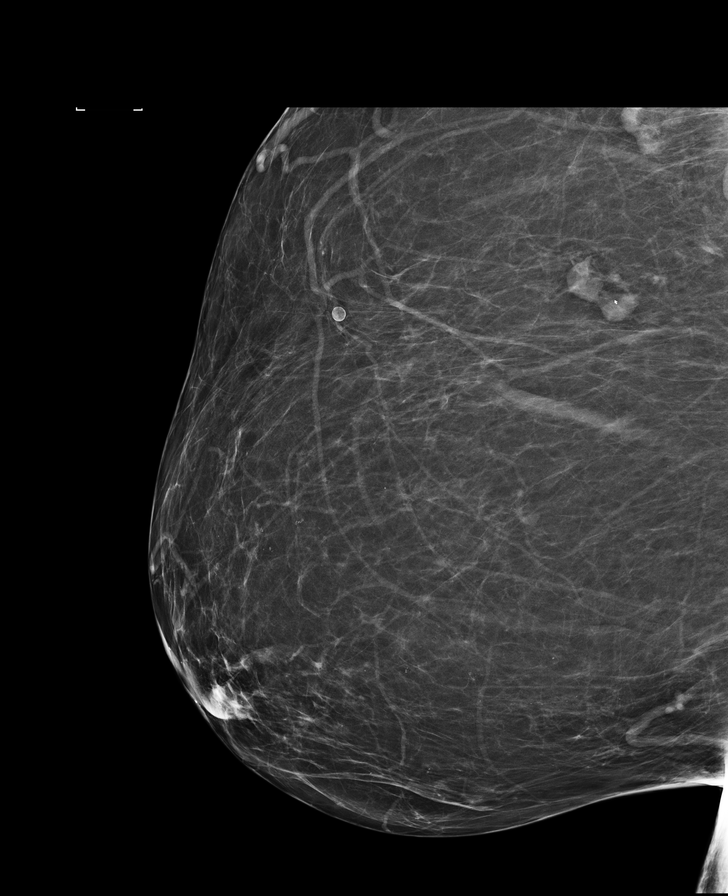

[L MLO synth-2D]
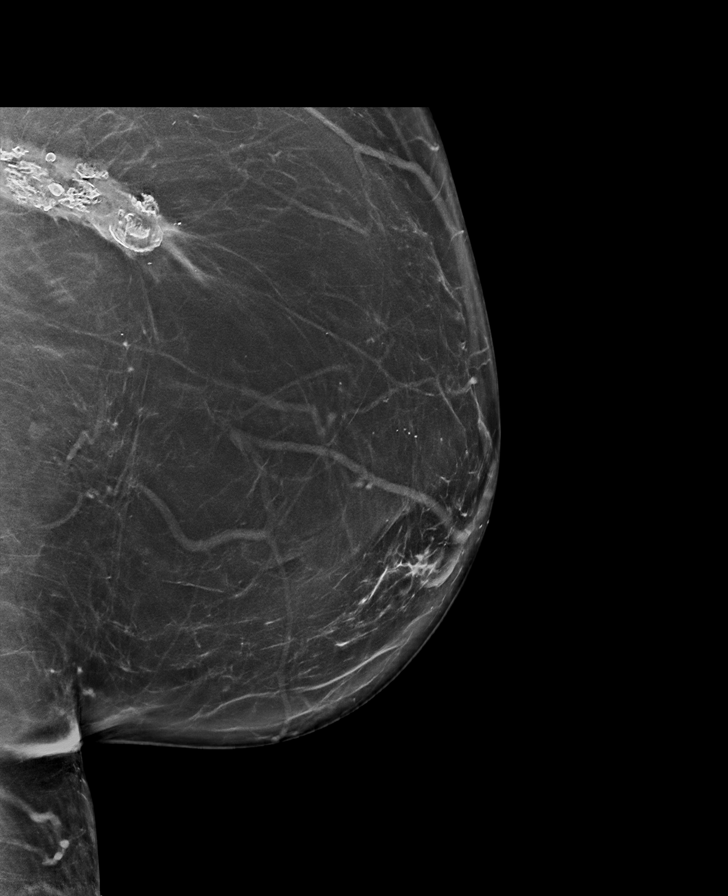

[L XCCL]
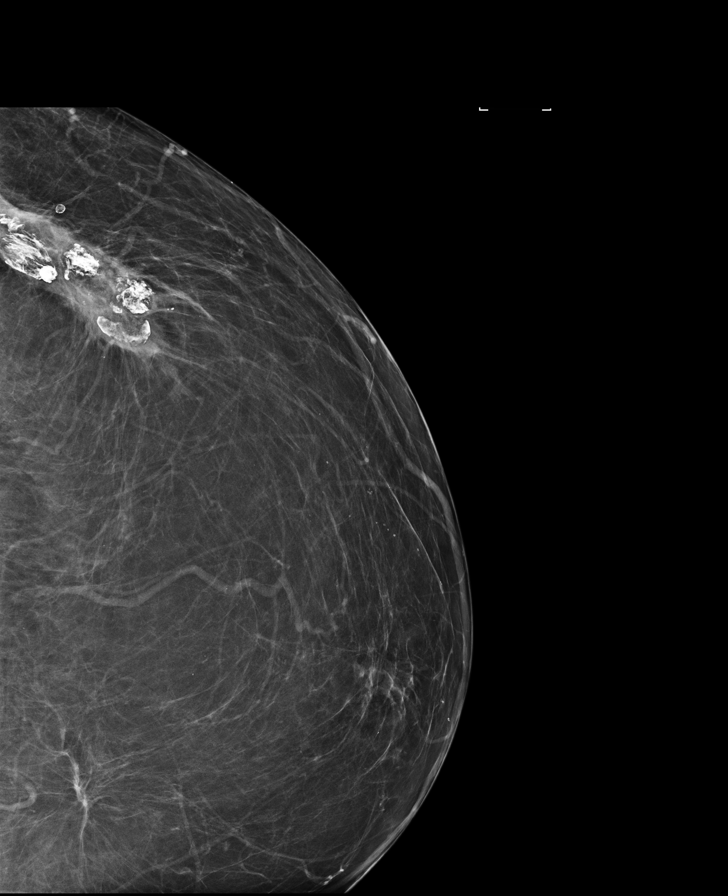

[R CC]
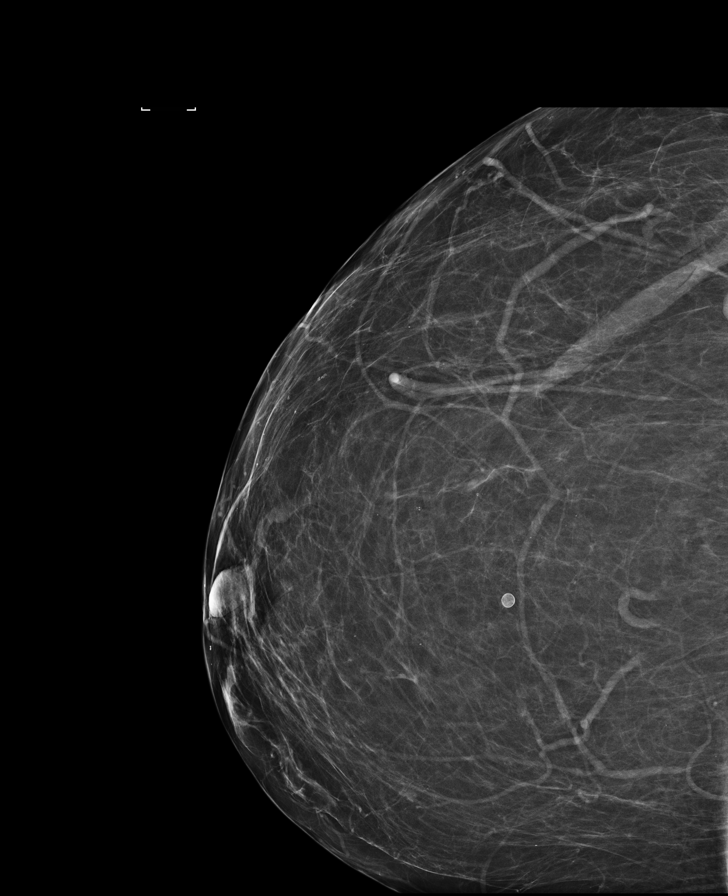

[L XCCL synth-2D]
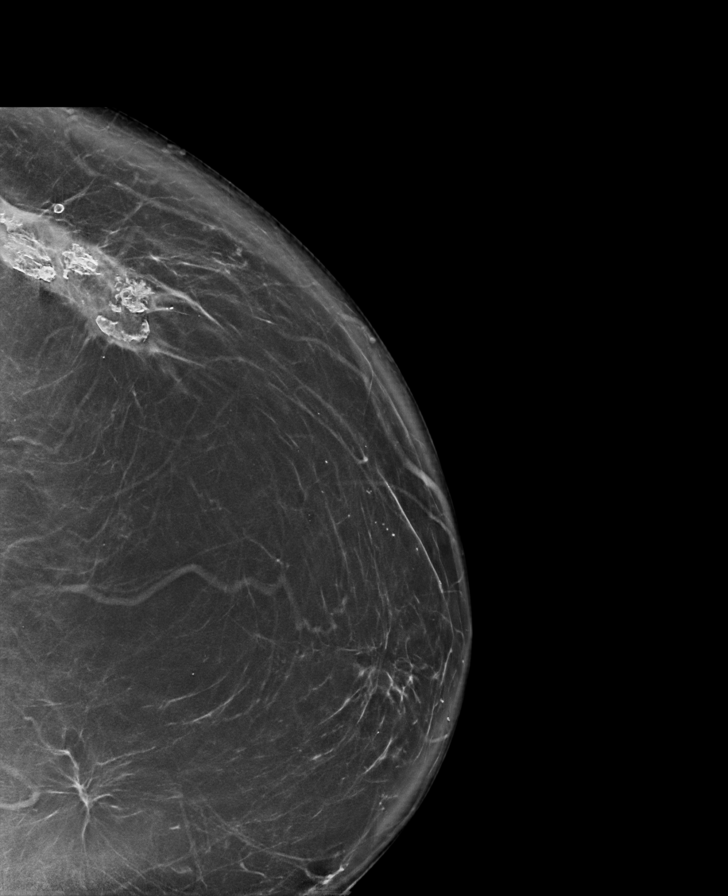

[8 of 40 positions shown; findings below may reference images not displayed]

ACR Breast Density Category b: There are scattered areas of
fibroglandular density.
FINDINGS: Changes of prior left lumpectomy are noted. No suspicious mass,
calcifications, or other abnormality is identified within either
breast.

Mammographic images were processed with CAD.
IMPRESSION: No mammographic evidence of malignancy.

RECOMMENDATION:
Bilateral diagnostic mammogram in 1 year.

I have discussed the findings and recommendations with the patient.
Results were also provided in writing at the conclusion of the
visit. If applicable, a reminder letter will be sent to the patient
regarding the next appointment.

BI-RADS CATEGORY  2: Benign.

## 2018-06-07 DIAGNOSIS — K219 Gastro-esophageal reflux disease without esophagitis: Secondary | ICD-10-CM | POA: Insufficient documentation

## 2018-06-07 DIAGNOSIS — M2550 Pain in unspecified joint: Secondary | ICD-10-CM | POA: Insufficient documentation

## 2018-10-19 ENCOUNTER — Other Ambulatory Visit: Payer: Self-pay | Admitting: Internal Medicine

## 2018-10-20 ENCOUNTER — Other Ambulatory Visit: Payer: Self-pay | Admitting: Physician Assistant

## 2018-10-20 DIAGNOSIS — N644 Mastodynia: Secondary | ICD-10-CM

## 2018-11-09 ENCOUNTER — Emergency Department (HOSPITAL_BASED_OUTPATIENT_CLINIC_OR_DEPARTMENT_OTHER)
Admission: EM | Admit: 2018-11-09 | Discharge: 2018-11-09 | Disposition: A | Discharge status: Home / Self Care | Payer: Medicare Other | Attending: Emergency Medicine | Admitting: Emergency Medicine

## 2018-11-09 ENCOUNTER — Other Ambulatory Visit: Payer: Self-pay

## 2018-11-09 ENCOUNTER — Encounter (HOSPITAL_BASED_OUTPATIENT_CLINIC_OR_DEPARTMENT_OTHER): Payer: Self-pay | Admitting: *Deleted

## 2018-11-09 ENCOUNTER — Emergency Department (HOSPITAL_BASED_OUTPATIENT_CLINIC_OR_DEPARTMENT_OTHER): Payer: Medicare Other

## 2018-11-09 DIAGNOSIS — Y9389 Activity, other specified: Secondary | ICD-10-CM | POA: Insufficient documentation

## 2018-11-09 DIAGNOSIS — X509XXA Other and unspecified overexertion or strenuous movements or postures, initial encounter: Secondary | ICD-10-CM | POA: Insufficient documentation

## 2018-11-09 DIAGNOSIS — E119 Type 2 diabetes mellitus without complications: Secondary | ICD-10-CM | POA: Diagnosis not present

## 2018-11-09 DIAGNOSIS — Z79899 Other long term (current) drug therapy: Secondary | ICD-10-CM | POA: Insufficient documentation

## 2018-11-09 DIAGNOSIS — Y9289 Other specified places as the place of occurrence of the external cause: Secondary | ICD-10-CM | POA: Insufficient documentation

## 2018-11-09 DIAGNOSIS — Z7984 Long term (current) use of oral hypoglycemic drugs: Secondary | ICD-10-CM | POA: Insufficient documentation

## 2018-11-09 DIAGNOSIS — S7011XA Contusion of right thigh, initial encounter: Secondary | ICD-10-CM | POA: Diagnosis not present

## 2018-11-09 DIAGNOSIS — Y999 Unspecified external cause status: Secondary | ICD-10-CM | POA: Diagnosis not present

## 2018-11-09 DIAGNOSIS — R52 Pain, unspecified: Secondary | ICD-10-CM

## 2018-11-09 DIAGNOSIS — Z87891 Personal history of nicotine dependence: Secondary | ICD-10-CM | POA: Diagnosis not present

## 2018-11-09 DIAGNOSIS — I10 Essential (primary) hypertension: Secondary | ICD-10-CM | POA: Diagnosis not present

## 2018-11-09 DIAGNOSIS — S79921A Unspecified injury of right thigh, initial encounter: Secondary | ICD-10-CM | POA: Diagnosis present

## 2018-11-09 NOTE — ED Notes (Signed)
Patient transported to XRAY 

## 2018-11-09 NOTE — ED Provider Notes (Signed)
[facility] EMERGENCY DEPARTMENT Provider Note   CSN: [id number] Arrival date & time: 11/09/18  1927    History   Chief Complaint Chief Complaint  Patient presents with  . Leg Injury    HPI Sherry Price is a 48 y.o. female.     The history is provided by the patient.  Leg Pain Location:  Leg Leg location:  R upper leg Pain details:    Quality:  Aching   Radiates to:  Does not radiate   Severity:  Mild   Onset quality:  Gradual   Timing:  Intermittent   Progression:  Waxing and waning Chronicity:  New Relieved by:  Nothing Worsened by:  Nothing Associated symptoms: tingling   Associated symptoms: no back pain, no decreased ROM, no fatigue, no muscle weakness, no neck pain, no numbness and no swelling     Past Medical History:  Diagnosis Date  . Arthritis   . Breast cancer ([place])    left breast - chemo and radiation  . Diabetes mellitus without complication ([place])   . Hypertension   . Malignant neoplasm of 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 ([place]) 05/21/2011   Left breast, 2 foci: 1.2 and 1.5 cm, histologic grade 3, ER 1-5%; PR 1-5%, HER-2/neu not overexpressed.  . Panic attacks   . Personal history of chemotherapy   . Personal history of radiation therapy   . Sleep apnea     Patient Active Problem List   Diagnosis Date Noted  . Cough with expectoration 02/27/2015  . Benign essential HTN 02/19/2015  . Heartburn   . Gastritis   . Change in bowel habits   . Benign neoplasm of ascending colon   . Anxiety 10/26/2013  . Affective bipolar disorder ([place]) 10/26/2013  . Essential (primary) hypertension 10/26/2013  . 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 Extreme obesity 10/26/2013  . Bipolar affective disorder ([place]) 10/26/2013  . Malignant neoplasm of breast ([place]) 10/26/2013  . Morbid obesity ([place]) 10/26/2013  . Diabetes mellitus ([place]) 10/26/2013  . 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 Breast cancer of  upper-outer quadrant of left female breast ([place]) 05/21/2011    Past Surgical History:  Procedure Laterality Date  . BREAST BIOPSY Left 2013   +  . BREAST BIOPSY Right    neg  . BREAST LUMPECTOMY  2013  . BREAST MAMMOSITE Left February 2013  . BREAST REDUCTION SURGERY  2005  . COLONOSCOPY WITH PROPOFOL N/A 01/09/2015   Procedure: COLONOSCOPY WITH PROPOFOL;  Surgeon: Lucilla Lame, MD;  Location: [facility] ENDOSCOPY;  Service: Endoscopy;  Laterality: N/A;  . ESOPHAGOGASTRODUODENOSCOPY (EGD) WITH PROPOFOL N/A 01/09/2015   Procedure: ESOPHAGOGASTRODUODENOSCOPY (EGD) WITH PROPOFOL;  Surgeon: Lucilla Lame, MD;  Location: [facility] ENDOSCOPY;  Service: Endoscopy;  Laterality: N/A;  . OOPHORECTOMY Right 2013  . REDUCTION MAMMAPLASTY       OB History    Gravida  7   Para  6   Term      Preterm      AB  1   Living  6     SAB  1   TAB      Ectopic      Multiple      Live Births           Obstetric Comments  1st Menstrual Cycle:  12  1st Pregnancy:  16         Home Medications    Prior to Admission medications   Medication Sig Start  Date End Date Taking? Authorizing Provider  acetaminophen (TYLENOL) 500 MG tablet Take 2 tablets (1,000 mg total) by mouth every 6 (six) hours as needed. 09/02/16   Charlesetta Shanks, MD  amLODipine (NORVASC) 10 MG tablet Take 10 mg by mouth daily. 06/06/16   [provider]  Aspirin-Acetaminophen-Caffeine (GOODY HEADACHE PO) Take 1 packet by mouth as needed.    [provider]  buPROPion (WELLBUTRIN) 100 MG tablet Take 100 mg by mouth 2 (two) times daily.    [provider]  calcium carbonate (TUMS) 500 MG chewable tablet Chew 1 tablet (200 mg of elemental calcium total) by mouth 2 (two) times daily. 09/16/16   Davonna Belling, MD  cephALEXin (KEFLEX) 500 MG capsule Take 1 capsule (500 mg total) by mouth 3 (three) times daily. Patient not taking: Reported on 11/26/2016 07/23/16   Harvest Dark, MD  clonazePAM (KLONOPIN) 0.5 MG  tablet Take 0.5 mg by mouth 2 (two) times daily as needed for anxiety.    [provider]  hydrALAZINE (APRESOLINE) 50 MG tablet Take 50 mg by mouth 2 (two) times daily. 06/30/16   [provider]  hydrochlorothiazide (HYDRODIURIL) 25 MG tablet Take by mouth.    [provider]  hydrOXYzine (ATARAX/VISTARIL) 25 MG tablet Take 1-2 tablets (25-50 mg total) by mouth every 6 (six) hours as needed for itching (may cause drowsiness). Patient not taking: Reported on 11/26/2016 06/26/16   Molpus, Jenny Reichmann, MD  ibuprofen (ADVIL,MOTRIN) 800 MG tablet Take 800 mg by mouth every 6 (six) hours as needed.    [provider]  lactulose (CHRONULAC) 10 GM/15ML solution TAKE 15 ML BY MOUTH TWICE DAILY AS NEEDED IF NO BOWEL MOVEMENT IN 24 HOURS, STOP IF BOWEL MOVEMENT OCCURS 11/07/15   Cammie Sickle, MD  metFORMIN (GLUCOPHAGE) 500 MG tablet Take 500 mg by mouth 2 (two) times daily with a meal.  10/23/13   [provider]  metoprolol succinate (TOPROL-XL) 25 MG 24 hr tablet Take 1 tablet (25 mg total) by mouth daily. 09/02/16   Charlesetta Shanks, MD  omeprazole (PRILOSEC) 10 MG capsule Take 10 mg by mouth daily.    [provider]  ondansetron (ZOFRAN ODT) 4 MG disintegrating tablet Allow 1-2 tablets to dissolve in your mouth every 8 hours as needed for nausea/vomiting 09/19/16   Hinda Kehr, MD  orphenadrine (NORFLEX) 100 MG tablet TK 1 T PO  BID PRN P 05/09/16   [provider]  tamoxifen (NOLVADEX) 20 MG tablet Take 1 tablet (20 mg total) by mouth daily. 08/04/16   Lloyd Huger, MD  venlafaxine XR (EFFEXOR-XR) 150 MG 24 hr capsule Take 150 mg by mouth daily with breakfast.  01/11/14   [provider]    Family History Family History  Problem Relation Age of Onset  . Hypertension Mother   . Heart disease Mother   . Diabetes Mother   . Breast cancer Cousin     Social History Social History   Tobacco Use  . Smoking status: Former Smoker     Years: 26.00  . Smokeless tobacco: Never Used  Substance Use Topics  . Alcohol use: Yes    Comment: occasionally  . Drug use: No     Allergies   Losartan   Review of Systems Review of Systems  Constitutional: Negative for fatigue.  Musculoskeletal: Positive for gait problem. Negative for back pain, joint swelling, myalgias, neck pain and neck stiffness.  Skin: Positive for color change.  Neurological: Negative for weakness and  numbness.     Physical Exam Updated Vital Signs  ED Triage Vitals  Enc Vitals Group     BP 11/09/18 1940 (!) 155/94     Pulse Rate 11/09/18 1940 95     Resp 11/09/18 1940 18     Temp 11/09/18 1940 99 F (37.2 C)     Temp src --      SpO2 11/09/18 1940 100 %     Weight 11/09/18 1938 (!) 326 lb (147.9 kg)     Height 11/09/18 1938 '5\' 7"'$  (1.702 m)     Head Circumference --      Peak Flow --      Pain Score 11/09/18 1938 7     Pain Loc --      Pain Edu? --      Excl. in [place]? --     Physical Exam Constitutional:      General: She is not in acute distress.    Appearance: She is not ill-appearing.  HENT:     Head: Normocephalic and atraumatic.  Eyes:     Extraocular Movements: Extraocular movements intact.     Pupils: Pupils are equal, round, and reactive to light.  Musculoskeletal: Normal range of motion.        General: Tenderness (right thigh ) present. No deformity.  Skin:    Findings: Bruising (right thigh) present.  Neurological:     General: No focal deficit present.     Mental Status: She is alert and oriented to person, place, and time.     Sensory: No sensory deficit.     Motor: No weakness.      ED Treatments / Results  Labs (all labs ordered are listed, but only abnormal results are displayed) Labs Reviewed - No data to display  EKG None  Radiology No results found.  Procedures Procedures (including critical care time)  Medications Ordered in ED Medications - No data to display   Initial Impression /  Assessment and Plan / ED Course  I have reviewed the triage vital signs and the nursing notes.  Pertinent labs & imaging results that were available during my care of the patient were reviewed by me and considered in my medical decision making (see chart for details).        Sherry Price is a 48 year old female history of diabetes, hypertension who presents to the ED with right thigh pain.  Patient with normal vitals.  No fever.  Patient had her right thigh up against an object earlier today.  Has been ambulatory.  Bruising to the right thigh.  Likely muscle contusion.  X-ray was negative.  Recommend ice, Tylenol, Motrin.  Patient neurovascularly and neuromuscularly intact.  Given return precautions.  Follow-up with primary care doctor as needed.  This chart was dictated using voice recognition software.  Despite best efforts to proofread,  errors can occur which can change the documentation meaning.    Final Clinical Impressions(s) / ED Diagnoses   Final diagnoses:  Contusion of right thigh, initial encounter    ED Discharge Orders    None       Lennice Sites, DO 11/09/18 2003

## 2018-11-09 NOTE — Discharge Instructions (Signed)
No fracture on your x-ray.  Continue Tylenol, Motrin, ice.

## 2018-11-09 NOTE — ED Triage Notes (Signed)
Pt c/o right thigh injury during fall this am

## 2019-06-15 ENCOUNTER — Encounter (HOSPITAL_BASED_OUTPATIENT_CLINIC_OR_DEPARTMENT_OTHER): Payer: Self-pay | Admitting: Emergency Medicine

## 2019-06-15 ENCOUNTER — Emergency Department (HOSPITAL_BASED_OUTPATIENT_CLINIC_OR_DEPARTMENT_OTHER): Payer: Medicare Other

## 2019-06-15 ENCOUNTER — Other Ambulatory Visit: Payer: Self-pay

## 2019-06-15 ENCOUNTER — Emergency Department (HOSPITAL_BASED_OUTPATIENT_CLINIC_OR_DEPARTMENT_OTHER)
Admission: EM | Admit: 2019-06-15 | Discharge: 2019-06-15 | Disposition: A | Discharge status: Home / Self Care | Payer: Medicare Other | Attending: Emergency Medicine | Admitting: Emergency Medicine

## 2019-06-15 DIAGNOSIS — R0789 Other chest pain: Secondary | ICD-10-CM | POA: Insufficient documentation

## 2019-06-15 DIAGNOSIS — E119 Type 2 diabetes mellitus without complications: Secondary | ICD-10-CM | POA: Insufficient documentation

## 2019-06-15 DIAGNOSIS — Z7984 Long term (current) use of oral hypoglycemic drugs: Secondary | ICD-10-CM | POA: Insufficient documentation

## 2019-06-15 DIAGNOSIS — Z888 Allergy status to other drugs, medicaments and biological substances status: Secondary | ICD-10-CM | POA: Insufficient documentation

## 2019-06-15 DIAGNOSIS — I10 Essential (primary) hypertension: Secondary | ICD-10-CM | POA: Diagnosis not present

## 2019-06-15 DIAGNOSIS — Z853 Personal history of malignant neoplasm of breast: Secondary | ICD-10-CM | POA: Insufficient documentation

## 2019-06-15 DIAGNOSIS — Z79899 Other long term (current) drug therapy: Secondary | ICD-10-CM | POA: Diagnosis not present

## 2019-06-15 LAB — TROPONIN I (HIGH SENSITIVITY): Troponin I (High Sensitivity): <2 ng/L (ref <18)

## 2019-06-15 LAB — COMPREHENSIVE METABOLIC PANEL
ALT: 20 U/L (ref 0–44)
AST: 17 U/L (ref 15–41)
Albumin: 4.2 g/dL (ref 3.5–5.0)
Alkaline Phosphatase: 121 U/L (ref 38–126)
Anion gap: 9 (ref 5–15)
BUN: 13 mg/dL (ref 6–20)
CO2: 27 mmol/L (ref 22–32)
Calcium: 9.3 mg/dL (ref 8.9–10.3)
Chloride: 100 mmol/L (ref 98–111)
Creatinine, Ser: 0.7 mg/dL (ref 0.44–1.00)
GFR calc Af Amer: >60 mL/min (ref >60)
GFR calc non Af Amer: >60 mL/min (ref >60)
Glucose, Bld: 139 mg/dL — ABNORMAL HIGH (ref 70–99)
Potassium: 4.2 mmol/L (ref 3.5–5.1)
Sodium: 136 mmol/L (ref 135–145)
Total Bilirubin: 0.5 mg/dL (ref 0.3–1.2)
Total Protein: 8.4 g/dL — ABNORMAL HIGH (ref 6.5–8.1)

## 2019-06-15 LAB — CBC WITH DIFFERENTIAL/PLATELET
Abs Immature Granulocytes: 0.07 10*3/uL (ref 0.00–0.07)
Basophils Absolute: 0.1 10*3/uL (ref 0.0–0.1)
Basophils Relative: 1 %
Eosinophils Absolute: 0.7 10*3/uL — ABNORMAL HIGH (ref 0.0–0.5)
Eosinophils Relative: 6 %
HCT: 40.9 % (ref 36.0–46.0)
Hemoglobin: 13 g/dL (ref 12.0–15.0)
Immature Granulocytes: 1 %
Lymphocytes Relative: 22 %
Lymphs Abs: 2.6 10*3/uL (ref 0.7–4.0)
MCH: 29.3 pg (ref 26.0–34.0)
MCHC: 31.8 g/dL (ref 30.0–36.0)
MCV: 92.1 fL (ref 80.0–100.0)
Monocytes Absolute: 0.7 10*3/uL (ref 0.1–1.0)
Monocytes Relative: 6 %
Neutro Abs: 7.6 10*3/uL (ref 1.7–7.7)
Neutrophils Relative %: 64 %
Platelets: 448 10*3/uL — ABNORMAL HIGH (ref 150–400)
RBC: 4.44 MIL/uL (ref 3.87–5.11)
RDW: 13.2 % (ref 11.5–15.5)
WBC: 11.8 10*3/uL — ABNORMAL HIGH (ref 4.0–10.5)
nRBC: 0 % (ref 0.0–0.2)

## 2019-06-15 LAB — D-DIMER, QUANTITATIVE (NOT AT ARMC): D-Dimer, Quant: <0.27 ug/mL-FEU (ref 0.00–0.50)

## 2019-06-15 MED ORDER — ASPIRIN 81 MG PO CHEW
324.0000 mg | CHEWABLE_TABLET | Freq: Once | ORAL | Status: AC
  Administered 2019-06-15: 324 mg via ORAL
  Filled 2019-06-15: qty 4

## 2019-06-15 NOTE — ED Provider Notes (Signed)
[facility] EMERGENCY DEPARTMENT Provider Note   CSN: [id number] Arrival date & time: 06/15/19  1110     History Chief Complaint  Patient presents with  . Chest Pain    Analys Ryden is a 49 y.o. female.  HPI 49 year old female presents with chest pain.  Ongoing for about a week.  It feels dull and achy and has been constant since it started.  Some mild shortness of breath when she walks.  Nothing makes the pain better or worse.  She has tried ibuprofen and Tylenol.  It is reproducible to her.  No leg swelling.  No pleuritic symptoms. Currently about a 5/10.   Past Medical History:  Diagnosis Date  . Arthritis   . Breast cancer ([place])    left breast - chemo and radiation  . Diabetes mellitus without complication ([place])   . Hypertension   . Malignant neoplasm of 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 ([place]) 05/21/2011   Left breast, 2 foci: 1.2 and 1.5 cm, histologic grade 3, ER 1-5%; PR 1-5%, HER-2/neu not overexpressed.  . Panic attacks   . Personal history of chemotherapy   . Personal history of radiation therapy   . Sleep apnea     Patient Active Problem List   Diagnosis Date Noted  . Cough with expectoration 02/27/2015  . Benign essential HTN 02/19/2015  . Heartburn   . Gastritis   . Change in bowel habits   . Benign neoplasm of ascending colon   . Anxiety 10/26/2013  . 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 Essential (primary) hypertension 10/26/2013  . 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 Extreme obesity 10/26/2013  . Bipolar affective disorder ([place]) 10/26/2013  . Malignant neoplasm of breast ([place]) 10/26/2013  . Morbid obesity ([place]) 10/26/2013  . Diabetes mellitus ([place]) 10/26/2013  . 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 Breast cancer of upper-outer quadrant of left female breast ([place]) 05/21/2011    Past Surgical History:  Procedure Laterality Date  . BREAST BIOPSY Left 2013   +  . BREAST BIOPSY  Right    neg  . BREAST LUMPECTOMY  2013  . BREAST MAMMOSITE Left February 2013  . BREAST REDUCTION SURGERY  2005  . COLONOSCOPY WITH PROPOFOL N/A 01/09/2015   Procedure: COLONOSCOPY WITH PROPOFOL;  Surgeon: Lucilla Lame, MD;  Location: [facility] ENDOSCOPY;  Service: Endoscopy;  Laterality: N/A;  . ESOPHAGOGASTRODUODENOSCOPY (EGD) WITH PROPOFOL N/A 01/09/2015   Procedure: ESOPHAGOGASTRODUODENOSCOPY (EGD) WITH PROPOFOL;  Surgeon: Lucilla Lame, MD;  Location: [facility] ENDOSCOPY;  Service: Endoscopy;  Laterality: N/A;  . OOPHORECTOMY Right 2013  . REDUCTION MAMMAPLASTY       OB History    Gravida  7   Para  6   Term      Preterm      AB  1   Living  6     SAB  1   TAB      Ectopic      Multiple      Live Births           Obstetric Comments  1st Menstrual Cycle:  12  1st Pregnancy:  47        Family History  Problem Relation Age of Onset  . Hypertension Mother   . Heart disease Mother   . Diabetes Mother   . Breast cancer Cousin     Social History   Tobacco Use  . Smoking status: Former Smoker    Years: 26.00  .  Smokeless tobacco: Never Used  Substance Use Topics  . Alcohol use: Yes    Comment: occasionally  . Drug use: No    Home Medications Prior to Admission medications   Medication Sig Start Date End Date Taking? Authorizing Provider  amLODipine (NORVASC) 10 MG tablet Take 10 mg by mouth daily. 06/06/16  Yes [provider]  atorvastatin (LIPITOR) 40 MG tablet Take 40 mg by mouth daily.   Yes [provider]  calcium carbonate (TUMS) 500 MG chewable tablet Chew 1 tablet (200 mg of elemental calcium total) by mouth 2 (two) times daily. 09/16/16  Yes Davonna Belling, MD  hydrochlorothiazide (HYDRODIURIL) 25 MG tablet Take by mouth.   Yes [provider]  metFORMIN (GLUCOPHAGE) 500 MG tablet Take 500 mg by mouth 2 (two) times daily with a meal.  10/23/13  Yes [provider]  orphenadrine (NORFLEX) 100 MG tablet TK 1 T PO  BID  PRN P 05/09/16  Yes [provider]  venlafaxine XR (EFFEXOR-XR) 150 MG 24 hr capsule Take 150 mg by mouth daily with breakfast.  01/11/14  Yes [provider]  acetaminophen (TYLENOL) 500 MG tablet Take 2 tablets (1,000 mg total) by mouth every 6 (six) hours as needed. 09/02/16   Charlesetta Shanks, MD  Aspirin-Acetaminophen-Caffeine (GOODY HEADACHE PO) Take 1 packet by mouth as needed.    [provider]  Dulaglutide (TRULICITY) 1.61 WR/6.0AV SOPN Inject 0.75 mg into the skin once a week.    [provider]  ibuprofen (ADVIL,MOTRIN) 800 MG tablet Take 800 mg by mouth every 6 (six) hours as needed.    [provider]  ondansetron (ZOFRAN ODT) 4 MG disintegrating tablet Allow 1-2 tablets to dissolve in your mouth every 8 hours as needed for nausea/vomiting 09/19/16   Hinda Kehr, MD    Allergies    Losartan  Review of Systems   Review of Systems  Constitutional: Negative for fever.  Respiratory: Positive for shortness of breath. Negative for cough.   Cardiovascular: Positive for chest pain.  Gastrointestinal: Negative for abdominal pain.  All other systems reviewed and are negative.   Physical Exam Updated Vital Signs BP 130/90 (BP Location: Right Wrist)   Pulse 85   Temp 98 F (36.7 C) (Oral)   Resp 16   Ht '5\' 7"'$  (1.702 m)   Wt (!) 155.9 kg   LMP  (LMP Unknown)   SpO2 99%   BMI 53.83 kg/m   Physical Exam Vitals and nursing note reviewed.  Constitutional:      Appearance: She is well-developed. She is obese.  HENT:     Head: Normocephalic and atraumatic.     Right Ear: External ear normal.     Left Ear: External ear normal.     Nose: Nose normal.  Eyes:     General:        Right eye: No discharge.        Left eye: No discharge.  Cardiovascular:     Rate and Rhythm: Normal rate and regular rhythm.     Heart sounds: Normal heart sounds.  Pulmonary:     Effort: Pulmonary effort is normal.     Breath sounds: Normal breath sounds.    Chest:     Chest wall: Tenderness (mild, sternal) present.  Abdominal:     Palpations: Abdomen is soft.     Tenderness: There is no abdominal tenderness.  Musculoskeletal:     Right lower leg: No edema.     Left  lower leg: No edema.  Skin:    General: Skin is warm and dry.  Neurological:     Mental Status: She is alert.  Psychiatric:        Mood and Affect: Mood is not anxious.     ED Results / Procedures / Treatments   Labs (all labs ordered are listed, but only abnormal results are displayed) Labs Reviewed  COMPREHENSIVE METABOLIC PANEL - Abnormal; Notable for the following components:      Result Value   Glucose, Bld 139 (*)    Total Protein 8.4 (*)    All other components within normal limits  CBC WITH DIFFERENTIAL/PLATELET - Abnormal; Notable for the following components:   WBC 11.8 (*)    Platelets 448 (*)    Eosinophils Absolute 0.7 (*)    All other components within normal limits  D-DIMER, QUANTITATIVE (NOT AT [facility])  TROPONIN I (HIGH SENSITIVITY)    EKG EKG Interpretation  Date/Time:  Wednesday June 15 2019 11:40:58 EST Ventricular Rate:  92 PR Interval:    QRS Duration: 96 QT Interval:  373 QTC Calculation: 462 R Axis:   24 Text Interpretation: Sinus rhythm Baseline wander in lead(s) II V3 no acute ST/T changes no significant change since 2018 Confirmed by Sherwood Gambler [id number]) on 06/15/2019 11:44:37 AM   Radiology DG Chest 2 View  Result Date: 06/15/2019 CLINICAL DATA:  Pt complaining of achy chest pain X 1 week. SOB when exerted and hx of hypertension controlled by medicine. EXAM: CHEST - 2 VIEW COMPARISON:  Chest radiograph 09/16/2016 FINDINGS: The heart size and mediastinal contours are within normal limits. The lungs are clear. No pneumothorax or pleural effusion. The visualized skeletal structures are unremarkable. IMPRESSION: No active cardiopulmonary disease. Electronically Signed   By: Audie Pinto M.D.   On: 06/15/2019 13:02     Procedures Procedures (including critical care time)  Medications Ordered in ED Medications  aspirin chewable tablet 324 mg (324 mg Oral Given 06/15/19 1154)    ED Course  I have reviewed the triage vital signs and the nursing notes.  Pertinent labs & imaging results that were available during my care of the patient were reviewed by me and considered in my medical decision making (see chart for details).    MDM Rules/Calculators/A&P                      Patient with constant chest pain x1 week.  ECG is unremarkable.  Troponin is negative.  My suspicion this is ACS is very low.  I do not think second troponin needed given length of time of symptoms.  D-dimer is negative given her history of breast cancer and some mild shortness of breath.  Overall, probably chest wall pain and I will recommend 3 times daily ibuprofen and follow-up with PCP.  We discussed return precautions.  Highly doubt dissection. Final Clinical Impression(s) / ED Diagnoses Final diagnoses:  Chest wall pain    Rx / DC Orders ED Discharge Orders    None       Sherwood Gambler, MD 06/15/19 1433

## 2019-06-15 NOTE — ED Triage Notes (Signed)
PT here with chest pain x 1 week.

## 2019-06-15 NOTE — Discharge Instructions (Signed)
If you develop recurrent, continued, or worsening chest pain, shortness of breath, fever, vomiting, abdominal or back pain, or any other new/concerning symptoms then return to the ER for evaluation.  

## 2019-08-30 ENCOUNTER — Emergency Department (HOSPITAL_BASED_OUTPATIENT_CLINIC_OR_DEPARTMENT_OTHER)
Admission: EM | Admit: 2019-08-30 | Discharge: 2019-08-30 | Disposition: A | Discharge status: Home / Self Care | Payer: Medicare Other | Attending: Emergency Medicine | Admitting: Emergency Medicine

## 2019-08-30 ENCOUNTER — Other Ambulatory Visit: Payer: Self-pay

## 2019-08-30 ENCOUNTER — Emergency Department (HOSPITAL_BASED_OUTPATIENT_CLINIC_OR_DEPARTMENT_OTHER): Payer: Medicare Other

## 2019-08-30 ENCOUNTER — Encounter (HOSPITAL_BASED_OUTPATIENT_CLINIC_OR_DEPARTMENT_OTHER): Payer: Self-pay

## 2019-08-30 DIAGNOSIS — N3000 Acute cystitis without hematuria: Secondary | ICD-10-CM | POA: Diagnosis not present

## 2019-08-30 DIAGNOSIS — R109 Unspecified abdominal pain: Secondary | ICD-10-CM | POA: Diagnosis present

## 2019-08-30 DIAGNOSIS — E119 Type 2 diabetes mellitus without complications: Secondary | ICD-10-CM | POA: Diagnosis not present

## 2019-08-30 DIAGNOSIS — Z79899 Other long term (current) drug therapy: Secondary | ICD-10-CM | POA: Diagnosis not present

## 2019-08-30 DIAGNOSIS — Z7984 Long term (current) use of oral hypoglycemic drugs: Secondary | ICD-10-CM | POA: Diagnosis not present

## 2019-08-30 DIAGNOSIS — I10 Essential (primary) hypertension: Secondary | ICD-10-CM | POA: Insufficient documentation

## 2019-08-30 DIAGNOSIS — Z87891 Personal history of nicotine dependence: Secondary | ICD-10-CM | POA: Diagnosis not present

## 2019-08-30 LAB — COMPREHENSIVE METABOLIC PANEL
ALT: 18 U/L (ref 0–44)
AST: 16 U/L (ref 15–41)
Albumin: 3.9 g/dL (ref 3.5–5.0)
Alkaline Phosphatase: 112 U/L (ref 38–126)
Anion gap: 10 (ref 5–15)
BUN: 15 mg/dL (ref 6–20)
CO2: 26 mmol/L (ref 22–32)
Calcium: 8.9 mg/dL (ref 8.9–10.3)
Chloride: 99 mmol/L (ref 98–111)
Creatinine, Ser: 0.76 mg/dL (ref 0.44–1.00)
GFR calc Af Amer: >60 mL/min (ref >60)
GFR calc non Af Amer: >60 mL/min (ref >60)
Glucose, Bld: 129 mg/dL — ABNORMAL HIGH (ref 70–99)
Potassium: 4.5 mmol/L (ref 3.5–5.1)
Sodium: 135 mmol/L (ref 135–145)
Total Bilirubin: 0.5 mg/dL (ref 0.3–1.2)
Total Protein: 7.9 g/dL (ref 6.5–8.1)

## 2019-08-30 LAB — URINALYSIS, MICROSCOPIC (REFLEX)

## 2019-08-30 LAB — CBC
HCT: 41.4 % (ref 36.0–46.0)
Hemoglobin: 13.4 g/dL (ref 12.0–15.0)
MCH: 29.2 pg (ref 26.0–34.0)
MCHC: 32.4 g/dL (ref 30.0–36.0)
MCV: 90.2 fL (ref 80.0–100.0)
Platelets: 370 10*3/uL (ref 150–400)
RBC: 4.59 MIL/uL (ref 3.87–5.11)
RDW: 13.2 % (ref 11.5–15.5)
WBC: 10 10*3/uL (ref 4.0–10.5)
nRBC: 0 % (ref 0.0–0.2)

## 2019-08-30 LAB — URINALYSIS, ROUTINE W REFLEX MICROSCOPIC
Bilirubin Urine: NEGATIVE
Glucose, UA: NEGATIVE mg/dL
Ketones, ur: NEGATIVE mg/dL
Nitrite: NEGATIVE
Protein, ur: NEGATIVE mg/dL
Specific Gravity, Urine: 1.02 (ref 1.005–1.030)
pH: 6 (ref 5.0–8.0)

## 2019-08-30 LAB — LIPASE, BLOOD: Lipase: 19 U/L (ref 11–51)

## 2019-08-30 LAB — PREGNANCY, URINE: Preg Test, Ur: NEGATIVE

## 2019-08-30 MED ORDER — ONDANSETRON HCL 4 MG PO TABS
4.0000 mg | ORAL_TABLET | Freq: Four times a day (QID) | ORAL | 0 refills | Status: AC
Start: 2019-08-30 — End: 2019-09-03

## 2019-08-30 MED ORDER — CEPHALEXIN 500 MG PO CAPS
500.0000 mg | ORAL_CAPSULE | Freq: Two times a day (BID) | ORAL | 0 refills | Status: AC
Start: 2019-08-30 — End: 2019-09-06

## 2019-08-30 MED ORDER — ONDANSETRON 4 MG PO TBDP
4.0000 mg | ORAL_TABLET | Freq: Once | ORAL | Status: AC
  Administered 2019-08-30: 4 mg via ORAL
  Filled 2019-08-30: qty 1

## 2019-08-30 NOTE — ED Notes (Signed)
Pt discharged to home. Discharge instructions have been discussed with patient and/or family members. Pt verbally acknowledges understanding d/c instructions, and endorses comprehension to checkout at registration before leaving.  °

## 2019-08-30 NOTE — ED Provider Notes (Signed)
[facility] Provider Note   CSN: [id number] Arrival date & time: 08/30/19  0944     History Chief Complaint  Patient presents with  . Abdominal Pain    Sherry Price is a 49 y.o. female.  The history is provided by the patient.  Abdominal Pain Pain location:  R flank, RLQ and suprapubic Pain quality: aching   Pain radiates to:  Does not radiate Pain severity:  Mild Onset quality:  Gradual Timing:  Intermittent Progression:  Waxing and waning Chronicity:  New Context: not previous surgeries   Relieved by:  Nothing Worsened by:  Nothing Associated symptoms: dysuria and nausea   Associated symptoms: no anorexia, no belching, no chest pain, no chills, no constipation, no cough, no diarrhea, no fever, no hematuria, no melena, no shortness of breath, no sore throat and no vomiting   Risk factors: has not had multiple surgeries        Past Medical History:  Diagnosis Date  . Arthritis   . Breast cancer ([place])    left breast - chemo and radiation  . Diabetes mellitus without complication ([place])   . Hypertension   . Malignant neoplasm of 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 ([place]) 05/21/2011   Left breast, 2 foci: 1.2 and 1.5 cm, histologic grade 3, ER 1-5%; PR 1-5%, HER-2/neu not overexpressed.  . Panic attacks   . Personal history of chemotherapy   . Personal history of radiation therapy   . Sleep apnea     Patient Active Problem List   Diagnosis Date Noted  . Cough with expectoration 02/27/2015  . Benign essential HTN 02/19/2015  . Heartburn   . Gastritis   . Change in bowel habits   . Benign neoplasm of ascending colon   . Anxiety 10/26/2013  . Affective bipolar disorder ([place]) 10/26/2013  . Essential (primary) hypertension 10/26/2013  . 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 Extreme obesity 10/26/2013  . Bipolar affective disorder ([place]) 10/26/2013  . Malignant neoplasm of breast ([place]) 10/26/2013    . Morbid obesity ([place]) 10/26/2013  . Diabetes mellitus ([place]) 10/26/2013  . 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 Breast cancer of upper-outer quadrant of left female breast ([place]) 05/21/2011    Past Surgical History:  Procedure Laterality Date  . BREAST BIOPSY Left 2013   +  . BREAST BIOPSY Right    neg  . BREAST LUMPECTOMY  2013  . BREAST MAMMOSITE Left February 2013  . BREAST REDUCTION SURGERY  2005  . COLONOSCOPY WITH PROPOFOL N/A 01/09/2015   Procedure: COLONOSCOPY WITH PROPOFOL;  Surgeon: Lucilla Lame, MD;  Location: [facility] ENDOSCOPY;  Service: Endoscopy;  Laterality: N/A;  . ESOPHAGOGASTRODUODENOSCOPY (EGD) WITH PROPOFOL N/A 01/09/2015   Procedure: ESOPHAGOGASTRODUODENOSCOPY (EGD) WITH PROPOFOL;  Surgeon: Lucilla Lame, MD;  Location: [facility] ENDOSCOPY;  Service: Endoscopy;  Laterality: N/A;  . OOPHORECTOMY Right 2013  . REDUCTION MAMMAPLASTY       OB History    Gravida  7   Para  6   Term      Preterm      AB  1   Living  6     SAB  1   TAB      Ectopic      Multiple      Live Births           Obstetric Comments  1st Menstrual Cycle:  12  1st Pregnancy:  66        Family  History  Problem Relation Age of Onset  . Hypertension Mother   . Heart disease Mother   . Diabetes Mother   . Breast cancer Cousin     Social History   Tobacco Use  . Smoking status: Former Smoker    Years: 26.00  . Smokeless tobacco: Never Used  Substance Use Topics  . Alcohol use: Yes    Comment: occasionally  . Drug use: No    Home Medications Prior to Admission medications   Medication Sig Start Date End Date Taking? Authorizing Provider  acetaminophen (TYLENOL) 500 MG tablet Take 2 tablets (1,000 mg total) by mouth every 6 (six) hours as needed. 09/02/16   Charlesetta Shanks, MD  amLODipine (NORVASC) 10 MG tablet Take 10 mg by mouth daily. 06/06/16   [provider]  Aspirin-Acetaminophen-Caffeine (GOODY HEADACHE PO) Take 1 packet by mouth as needed.     [provider]  atorvastatin (LIPITOR) 40 MG tablet Take 40 mg by mouth daily.    [provider]  calcium carbonate (TUMS) 500 MG chewable tablet Chew 1 tablet (200 mg of elemental calcium total) by mouth 2 (two) times daily. 09/16/16   Davonna Belling, MD  cephALEXin (KEFLEX) 500 MG capsule Take 1 capsule (500 mg total) by mouth 2 (two) times daily for 7 days. 08/30/19 09/06/19  Chi Woodham, DO  Dulaglutide (TRULICITY) 6.81 LX/7.2IO SOPN Inject 0.75 mg into the skin once a week.    [provider]  hydrochlorothiazide (HYDRODIURIL) 25 MG tablet Take by mouth.    [provider]  ibuprofen (ADVIL,MOTRIN) 800 MG tablet Take 800 mg by mouth every 6 (six) hours as needed.    [provider]  labetalol (NORMODYNE) 200 MG tablet Take 200 mg by mouth 2 (two) times daily. 07/12/19   [provider]  metFORMIN (GLUCOPHAGE) 500 MG tablet Take 500 mg by mouth 2 (two) times daily with a meal.  10/23/13   [provider]  ondansetron (ZOFRAN ODT) 4 MG disintegrating tablet Allow 1-2 tablets to dissolve in your mouth every 8 hours as needed for nausea/vomiting 09/19/16   Hinda Kehr, MD  ondansetron (ZOFRAN) 4 MG tablet Take 1 tablet (4 mg total) by mouth every 6 (six) hours for 15 doses. 08/30/19 09/03/19  Chanette Demo, DO  orphenadrine (NORFLEX) 100 MG tablet TK 1 T PO  BID PRN P 05/09/16   [provider]  venlafaxine XR (EFFEXOR-XR) 150 MG 24 hr capsule Take 150 mg by mouth daily with breakfast.  01/11/14   [provider]    Allergies    Losartan  Review of Systems   Review of Systems  Constitutional: Negative for chills and fever.  HENT: Negative for ear pain and sore throat.   Eyes: Negative for pain and visual disturbance.  Respiratory: Negative for cough and shortness of breath.   Cardiovascular: Negative for chest pain and palpitations.  Gastrointestinal: Positive for abdominal pain and nausea. Negative for anorexia,  constipation, diarrhea, melena and vomiting.  Genitourinary: Positive for dysuria. Negative for hematuria.  Musculoskeletal: Negative for arthralgias and back pain.  Skin: Negative for color change and rash.  Neurological: Negative for seizures and syncope.  All other systems reviewed and are negative.   Physical Exam Updated Vital Signs  ED Triage Vitals  Enc Vitals Group     BP 08/30/19 1008 (!) 152/102     Pulse Rate 08/30/19 1008 92     Resp 08/30/19 1008 18     Temp 08/30/19 1008  98.6 F (37 C)     Temp Source 08/30/19 1008 Oral     SpO2 08/30/19 1008 100 %     Weight 08/30/19 1009 (!) 340 lb (154.2 kg)     Height 08/30/19 1009 _0  (1.702 m)     Head Circumference --      Peak Flow --      Pain Score 08/30/19 1014 7     Pain Loc --      Pain Edu? --      Excl. in [place]? --     Physical Exam Vitals and nursing note reviewed.  Constitutional:      General: She is not in acute distress.    Appearance: She is well-developed. She is not ill-appearing.  HENT:     Head: Normocephalic and atraumatic.     Mouth/Throat:     Mouth: Mucous membranes are moist.  Eyes:     Extraocular Movements: Extraocular movements intact.     Conjunctiva/sclera: Conjunctivae normal.     Pupils: Pupils are equal, round, and reactive to light.  Cardiovascular:     Rate and Rhythm: Normal rate and regular rhythm.     Heart sounds: Normal heart sounds. No murmur.  Pulmonary:     Effort: Pulmonary effort is normal. No respiratory distress.     Breath sounds: Normal breath sounds.  Abdominal:     General: Abdomen is flat. Bowel sounds are normal. There is no distension.     Palpations: Abdomen is soft.     Tenderness: There is abdominal tenderness in the right lower quadrant and suprapubic area. There is right CVA tenderness. There is no left CVA tenderness, guarding or rebound. Negative signs include Murphy's sign, Rovsing's sign and McBurney's sign.  Musculoskeletal:     Cervical back: Neck  supple.  Skin:    General: Skin is warm and dry.     Capillary Refill: Capillary refill takes less than 2 seconds.  Neurological:     General: No focal deficit present.     Mental Status: She is alert.  Psychiatric:        Mood and Affect: Mood normal.     ED Results / Procedures / Treatments   Labs (all labs ordered are listed, but only abnormal results are displayed) Labs Reviewed  COMPREHENSIVE METABOLIC PANEL - Abnormal; Notable for the following components:      Result Value   Glucose, Bld 129 (*)    All other components within normal limits  URINALYSIS, ROUTINE W REFLEX MICROSCOPIC - Abnormal; Notable for the following components:   APPearance HAZY (*)    Hgb urine dipstick SMALL (*)    Leukocytes,Ua LARGE (*)    All other components within normal limits  URINALYSIS, MICROSCOPIC (REFLEX) - Abnormal; Notable for the following components:   Bacteria, UA MANY (*)    All other components within normal limits  URINE CULTURE  LIPASE, BLOOD  CBC  PREGNANCY, URINE    EKG None  Radiology CT Renal Stone Study  Result Date: 08/30/2019 CLINICAL DATA:  49 year old female with history of flank pain. Nausea and diarrhea. Suspected kidney stone. EXAM: CT ABDOMEN AND PELVIS WITHOUT CONTRAST TECHNIQUE: Multidetector CT imaging of the abdomen and pelvis was performed following the standard protocol without IV contrast. COMPARISON:  CT the abdomen and pelvis 01/02/2015. FINDINGS: Lower chest: Unremarkable. Hepatobiliary: Diffuse low attenuation throughout the hepatic parenchyma, indicative of hepatic steatosis. No discrete cystic or solid hepatic lesions are confidently identified on today's noncontrast CT  examination. Unenhanced appearance of the gallbladder is normal. Pancreas: No definite pancreatic mass or peripancreatic fluid collections or inflammatory changes are noted on today's noncontrast CT examination. Spleen: Unremarkable. Adrenals/Urinary Tract: Nonobstructive calculi are  noted within the collecting systems of both kidneys measuring up to 5 mm in the upper pole collecting system of left kidney. No additional calculi are noted along the course of either ureter or within the lumen of the urinary bladder. There is no hydroureteronephrosis. Large low-attenuation lesion in the lateral aspect of the interpolar region of the left kidney measuring 11.2 x 8.6 x 12.2 cm, previously characterized as a simple cyst. Unenhanced appearance of the right kidney and bilateral adrenal glands are normal in appearance. Urinary bladder is normal in appearance. Stomach/Bowel: Normal appearance of the stomach. No pathologic dilatation of small bowel or colon. Numerous colonic diverticulae are noted, without surrounding inflammatory changes to suggest an acute diverticulitis at this time. The appendix is not confidently identified and may be surgically absent. Regardless, there are no inflammatory changes noted adjacent to the cecum to suggest the presence of an acute appendicitis at this time. Vascular/Lymphatic: No atherosclerotic calcifications in the abdominal aorta or pelvic vasculature. No lymphadenopathy noted in the abdomen or pelvis. Reproductive: The unenhanced appearance of the uterus and ovaries are unremarkable. Other: No significant volume of ascites.  No pneumoperitoneum. Musculoskeletal: There are no aggressive appearing lytic or blastic lesions noted in the visualized portions of the skeleton. IMPRESSION: 1. No acute findings are noted in the abdomen or pelvis to account for the patient's symptoms. 2. Nonobstructive calculi are noted within the collecting systems of both kidneys, largest of which measures 5 mm in the upper pole collecting system of left kidney. No ureteral stones or findings of urinary tract obstruction. 3. Hepatic steatosis. 4. Colonic diverticulosis without evidence of acute diverticulitis at this time. Electronically Signed   By: Vinnie Langton M.D.   On: 08/30/2019  16:05    Procedures Procedures (including critical care time)  Medications Ordered in ED Medications  ondansetron (ZOFRAN-ODT) disintegrating tablet 4 mg (has no administration in time range)    ED Course  I have reviewed the triage vital signs and the nursing notes.  Pertinent labs & imaging results that were available during my care of the patient were reviewed by me and considered in my medical decision making (see chart for details).    MDM Rules/Calculators/A&P                      Allianna Beaubien is a 49 year old female with history of breast cancer, diabetes who presents to the ED with lower abdominal pain, flank pain.  Normal vitals.  No fever.  Pain on and off for the last several days.  Normal bowel movements.  Possibly some pain with urination.  No hematuria, no history of kidney stones.  No history of major abdominal surgeries.  Mostly tender in the suprapubic, right lower quadrant, right flank on exam.  Concern for kidney stone versus other intra-abdominal process such as possible appendicitis.  Patient with lab work that is overall unremarkable.  No significant anemia, electrolyte abnormality, kidney injury.  Urinalysis appears to be consistent with urinary tract infection.  However will get a CT scan to further evaluate for other causes of pain.  Patient given Zofran.  CT scan overall unremarkable.  Patient has nonobstructive kidney stones.  She may have passed a kidney stone.  Otherwise no acute findings in abdomen and pelvis.  Will  treat UTI with antibiotics.  Given prescription for Zofran.  Discharged in good condition.  This chart was dictated using voice recognition software.  Despite best efforts to proofread,  errors can occur which can change the documentation meaning.    Final Clinical Impression(s) / ED Diagnoses Final diagnoses:  Acute cystitis without hematuria    Rx / DC Orders ED Discharge Orders         Ordered    cephALEXin (KEFLEX) 500 MG capsule   2 times daily     08/30/19 1611    ondansetron (ZOFRAN) 4 MG tablet  Every 6 hours     08/30/19 1612           Lennice Sites, DO 08/30/19 1612

## 2019-08-30 NOTE — ED Notes (Signed)
Pharmacy and medications updated with patient 

## 2019-08-30 NOTE — ED Triage Notes (Signed)
Pt states that she has had nausea and diarrhea since Saturday, reports feeling weak.

## 2019-08-31 LAB — URINE CULTURE

## 2020-02-26 ENCOUNTER — Emergency Department (HOSPITAL_BASED_OUTPATIENT_CLINIC_OR_DEPARTMENT_OTHER)
Admission: EM | Admit: 2020-02-26 | Discharge: 2020-02-26 | Disposition: A | Discharge status: Home / Self Care | Payer: Medicare Other | Attending: Emergency Medicine | Admitting: Emergency Medicine

## 2020-02-26 ENCOUNTER — Encounter (HOSPITAL_BASED_OUTPATIENT_CLINIC_OR_DEPARTMENT_OTHER): Payer: Self-pay

## 2020-02-26 ENCOUNTER — Other Ambulatory Visit: Payer: Self-pay

## 2020-02-26 DIAGNOSIS — E119 Type 2 diabetes mellitus without complications: Secondary | ICD-10-CM | POA: Insufficient documentation

## 2020-02-26 DIAGNOSIS — Z87891 Personal history of nicotine dependence: Secondary | ICD-10-CM | POA: Diagnosis not present

## 2020-02-26 DIAGNOSIS — I10 Essential (primary) hypertension: Secondary | ICD-10-CM | POA: Insufficient documentation

## 2020-02-26 DIAGNOSIS — Z7982 Long term (current) use of aspirin: Secondary | ICD-10-CM | POA: Insufficient documentation

## 2020-02-26 DIAGNOSIS — Z79899 Other long term (current) drug therapy: Secondary | ICD-10-CM | POA: Insufficient documentation

## 2020-02-26 DIAGNOSIS — Z9012 Acquired absence of left breast and nipple: Secondary | ICD-10-CM | POA: Diagnosis not present

## 2020-02-26 DIAGNOSIS — H02843 Edema of right eye, unspecified eyelid: Secondary | ICD-10-CM | POA: Diagnosis present

## 2020-02-26 DIAGNOSIS — Z7984 Long term (current) use of oral hypoglycemic drugs: Secondary | ICD-10-CM | POA: Diagnosis not present

## 2020-02-26 DIAGNOSIS — L03213 Periorbital cellulitis: Secondary | ICD-10-CM

## 2020-02-26 DIAGNOSIS — Z853 Personal history of malignant neoplasm of breast: Secondary | ICD-10-CM | POA: Insufficient documentation

## 2020-02-26 MED ORDER — DOXYCYCLINE HYCLATE 100 MG PO TABS
100.0000 mg | ORAL_TABLET | Freq: Once | ORAL | Status: AC
  Administered 2020-02-26: 100 mg via ORAL
  Filled 2020-02-26: qty 1

## 2020-02-26 MED ORDER — DOXYCYCLINE HYCLATE 100 MG PO CAPS
100.0000 mg | ORAL_CAPSULE | Freq: Two times a day (BID) | ORAL | 0 refills | Status: DC
Start: 2020-02-26 — End: 2022-05-14

## 2020-02-26 NOTE — Discharge Instructions (Signed)
Take the antibiotic as directed. For the next 7 days. Expect improvement over the next 2 days. Return for any eyeball pain or pain with moving the eyeball. Presentation consistent with a periorbital cellulitis outside of the orbit.

## 2020-02-26 NOTE — ED Provider Notes (Signed)
[facility] EMERGENCY DEPARTMENT Provider Note   CSN: [id number] Arrival date & time: 02/26/20  1048     History Chief Complaint  Patient presents with  . Eye Problem    Sherry Price is a 49 y.o. female.  Patient about a week ago had an eyebrow with kind of an infected hair follicle. Which she picked. Now has a scab over that area and swelling around the right eye upper lid lower lid with some erythema and some discomfort to palpation. But no visual changes no eyeball pain. No pain with movement of the eyes. Patient has no known allergies. Past medical history significant for diabetes and hypertension. Patient does not wear contacts.        Past Medical History:  Diagnosis Date  . Arthritis   . Breast cancer ([place])    left breast - chemo and radiation  . Diabetes mellitus without complication ([place])   . Hypertension   . 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  . Panic attacks   . Personal history of chemotherapy   . Personal history of radiation therapy   . Sleep apnea     Patient Active Problem List   Diagnosis Date Noted  . Cough with expectoration 02/27/2015  . Benign essential HTN 02/19/2015  . Heartburn   . Gastritis   . Change in bowel habits   . Benign neoplasm of ascending colon   . Anxiety 10/26/2013  . 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 Essential (primary) hypertension 10/26/2013  . 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 Extreme obesity 10/26/2013  . Bipolar affective disorder ([place]) 10/26/2013  . Malignant neoplasm of breast ([place]) 10/26/2013  . Morbid obesity ([place]) 10/26/2013  . Diabetes mellitus ([place]) 10/26/2013  . 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 Breast cancer of upper-outer quadrant of left female breast ([place]) 05/21/2011    Past Surgical  History:  Procedure Laterality Date  . BREAST BIOPSY Left 2013   +  . BREAST BIOPSY Right    neg  . BREAST LUMPECTOMY  2013  . BREAST MAMMOSITE Left February 2013  . BREAST REDUCTION SURGERY  2005  . COLONOSCOPY WITH PROPOFOL N/A 01/09/2015   Procedure: COLONOSCOPY WITH PROPOFOL;  Surgeon: Lucilla Lame, MD;  Location: [facility] ENDOSCOPY;  Service: Endoscopy;  Laterality: N/A;  . ESOPHAGOGASTRODUODENOSCOPY (EGD) WITH PROPOFOL N/A 01/09/2015   Procedure: ESOPHAGOGASTRODUODENOSCOPY (EGD) WITH PROPOFOL;  Surgeon: Lucilla Lame, MD;  Location: [facility] ENDOSCOPY;  Service: Endoscopy;  Laterality: N/A;  . OOPHORECTOMY Right 2013  . REDUCTION MAMMAPLASTY       OB History    Gravida  7   Para  6   Term      Preterm      AB  1   Living  6     SAB  1   TAB      Ectopic      Multiple      Live Births           Obstetric Comments  1st Menstrual Cycle:  12  1st Pregnancy:  48        Family History  Problem Relation Age of Onset  . Hypertension Mother   . Heart disease Mother   . Diabetes Mother   . Breast cancer Cousin     Social History   Tobacco Use  .  Smoking status: Former Smoker    Years: 26.00  . Smokeless tobacco: Never Used  Substance Use Topics  . Alcohol use: Yes    Comment: occasionally  . Drug use: No    Home Medications Prior to Admission medications   Medication Sig Start Date End Date Taking? Authorizing Provider  acetaminophen (TYLENOL) 500 MG tablet Take 2 tablets (1,000 mg total) by mouth every 6 (six) hours as needed. 09/02/16   Charlesetta Shanks, MD  amLODipine (NORVASC) 10 MG tablet Take 10 mg by mouth daily. 06/06/16   [provider]  Aspirin-Acetaminophen-Caffeine (GOODY HEADACHE PO) Take 1 packet by mouth as needed.    [provider]  atorvastatin (LIPITOR) 40 MG tablet Take 40 mg by mouth daily.    [provider]  calcium carbonate (TUMS) 500 MG chewable tablet Chew 1 tablet (200 mg of elemental calcium total) by mouth  2 (two) times daily. 09/16/16   Davonna Belling, MD  doxycycline (VIBRAMYCIN) 100 MG capsule Take 1 capsule (100 mg total) by mouth 2 (two) times daily. 02/26/20   Fredia Sorrow, MD  Dulaglutide (TRULICITY) 9.48 NI/6.2VO SOPN Inject 0.75 mg into the skin once a week.    [provider]  hydrochlorothiazide (HYDRODIURIL) 25 MG tablet Take by mouth.    [provider]  ibuprofen (ADVIL,MOTRIN) 800 MG tablet Take 800 mg by mouth every 6 (six) hours as needed.    [provider]  labetalol (NORMODYNE) 200 MG tablet Take 200 mg by mouth 2 (two) times daily. 07/12/19   [provider]  metFORMIN (GLUCOPHAGE) 500 MG tablet Take 500 mg by mouth 2 (two) times daily with a meal.  10/23/13   [provider]  ondansetron (ZOFRAN ODT) 4 MG disintegrating tablet Allow 1-2 tablets to dissolve in your mouth every 8 hours as needed for nausea/vomiting 09/19/16   Hinda Kehr, MD  orphenadrine (NORFLEX) 100 MG tablet TK 1 T PO  BID PRN P 05/09/16   [provider]  venlafaxine XR (EFFEXOR-XR) 150 MG 24 hr capsule Take 150 mg by mouth daily with breakfast.  01/11/14   [provider]    Allergies    Losartan  Review of Systems   Review of Systems  Constitutional: Negative for chills and fever.  HENT: Positive for facial swelling. Negative for rhinorrhea and sore throat.   Eyes: Negative for photophobia, pain, redness, itching and visual disturbance.  Respiratory: Negative for cough and shortness of breath.   Cardiovascular: Negative for chest pain and leg swelling.  Gastrointestinal: Negative for abdominal pain, diarrhea, nausea and vomiting.  Genitourinary: Negative for dysuria.  Musculoskeletal: Negative for back pain and neck pain.  Skin: Negative for rash.  Neurological: Negative for dizziness, light-headedness and headaches.  Hematological: Does not bruise/bleed easily.  Psychiatric/Behavioral: Negative for confusion.    Physical  Exam Updated Vital Signs BP 112/73 (BP Location: Right Arm)   Pulse 88   Temp 98.3 F (36.8 C) (Oral)   Resp 18   LMP  (LMP Unknown)   SpO2 100%   Physical Exam Vitals and nursing note reviewed.  Constitutional:      General: She is not in acute distress.    Appearance: Normal appearance. She is well-developed.  HENT:     Head: Normocephalic and atraumatic.  Eyes:     Extraocular Movements: Extraocular movements intact.     Conjunctiva/sclera: Conjunctivae normal.     Pupils: Pupils are equal, round, and reactive to light.     Comments:  No pain with range of motion of the right eye. No significant redness to the sclera. Swelling to the upper lid and lower lid no evidence of any stye. Right eyebrow area with a scab no purulent discharge but erythema around that area.  Cardiovascular:     Rate and Rhythm: Normal rate and regular rhythm.     Heart sounds: No murmur heard.   Pulmonary:     Effort: Pulmonary effort is normal. No respiratory distress.     Breath sounds: Normal breath sounds.  Abdominal:     Palpations: Abdomen is soft.     Tenderness: There is no abdominal tenderness.  Musculoskeletal:        General: Normal range of motion.     Cervical back: Normal range of motion and neck supple.  Skin:    General: Skin is warm and dry.     Capillary Refill: Capillary refill takes less than 2 seconds.  Neurological:     General: No focal deficit present.     Mental Status: She is alert and oriented to person, place, and time.     ED Results / Procedures / Treatments   Labs (all labs ordered are listed, but only abnormal results are displayed) Labs Reviewed - No data to display  EKG None  Radiology No results found.  Procedures Procedures (including critical care time)  Medications Ordered in ED Medications  doxycycline (VIBRA-TABS) tablet 100 mg (has no administration in time range)    ED Course  I have reviewed the triage vital signs and the nursing  notes.  Pertinent labs & imaging results that were available during my care of the patient were reviewed by me and considered in my medical decision making (see chart for details).    MDM Rules/Calculators/A&P                          Findings consistent with a cellulitis around the right eye. No evidence of orbital cellulitis. Consistent with periorbital cellulitis. No pain with range of motion of the eye. Will treat with doxycycline. Patient given precautions to return for any eyeball pain. Patient does not wear contacts. Patient has no antibiotic allergies. Patient does have a history of diabetes. Patient nontoxic no acute distress. No fever.   Final Clinical Impression(s) / ED Diagnoses Final diagnoses:  Periorbital cellulitis of right eye    Rx / DC Orders ED Discharge Orders         Ordered    doxycycline (VIBRAMYCIN) 100 MG capsule  2 times daily        02/26/20 1211           Fredia Sorrow, MD 02/26/20 1217

## 2020-02-26 NOTE — ED Triage Notes (Signed)
She reports swelling, redness and itching of her right eyelids x 2 days. She has an abrasion later and superior to her right eyebrow, which she states she did herself with her fingernail as she was scratching her eye. She is ambulatory and in no distress.

## 2020-09-12 ENCOUNTER — Emergency Department (HOSPITAL_BASED_OUTPATIENT_CLINIC_OR_DEPARTMENT_OTHER)
Admission: EM | Admit: 2020-09-12 | Discharge: 2020-09-12 | Disposition: A | Discharge status: Home / Self Care | Payer: Medicare Other | Attending: Emergency Medicine | Admitting: Emergency Medicine

## 2020-09-12 ENCOUNTER — Encounter (HOSPITAL_BASED_OUTPATIENT_CLINIC_OR_DEPARTMENT_OTHER): Payer: Self-pay | Admitting: Emergency Medicine

## 2020-09-12 ENCOUNTER — Emergency Department (HOSPITAL_BASED_OUTPATIENT_CLINIC_OR_DEPARTMENT_OTHER): Payer: Medicare Other

## 2020-09-12 ENCOUNTER — Other Ambulatory Visit: Payer: Self-pay

## 2020-09-12 DIAGNOSIS — Z7984 Long term (current) use of oral hypoglycemic drugs: Secondary | ICD-10-CM | POA: Diagnosis not present

## 2020-09-12 DIAGNOSIS — Z79899 Other long term (current) drug therapy: Secondary | ICD-10-CM | POA: Diagnosis not present

## 2020-09-12 DIAGNOSIS — X509XXA Other and unspecified overexertion or strenuous movements or postures, initial encounter: Secondary | ICD-10-CM | POA: Diagnosis not present

## 2020-09-12 DIAGNOSIS — I10 Essential (primary) hypertension: Secondary | ICD-10-CM | POA: Diagnosis not present

## 2020-09-12 DIAGNOSIS — Z853 Personal history of malignant neoplasm of breast: Secondary | ICD-10-CM | POA: Diagnosis not present

## 2020-09-12 DIAGNOSIS — S299XXA Unspecified injury of thorax, initial encounter: Secondary | ICD-10-CM | POA: Insufficient documentation

## 2020-09-12 DIAGNOSIS — E119 Type 2 diabetes mellitus without complications: Secondary | ICD-10-CM | POA: Diagnosis not present

## 2020-09-12 DIAGNOSIS — Z87891 Personal history of nicotine dependence: Secondary | ICD-10-CM | POA: Insufficient documentation

## 2020-09-12 DIAGNOSIS — S298XXA Other specified injuries of thorax, initial encounter: Secondary | ICD-10-CM

## 2020-09-12 NOTE — ED Triage Notes (Signed)
Pt states she leaned over the couch to pick something up and felt a pop on her right side around the rib area   Pt states the pain has progressively gotten worse  Pt states the incident happened yesterday around 1430

## 2020-09-12 NOTE — ED Notes (Signed)
Patient transported to X-ray 

## 2020-09-12 NOTE — Discharge Instructions (Addendum)
Follow-up with your pain management doctor for continued pain.  Watch for worsening shortness of breath.

## 2020-09-12 NOTE — ED Provider Notes (Signed)
[facility] EMERGENCY DEPARTMENT Provider Note   CSN: [id number] Arrival date & time: 09/12/20  6962     History Chief Complaint  Patient presents with  . chest wall pain     Sherry Price is a 50 y.o. female.  HPI Patient presents with right sided chest pain.  Began acutely when she was bending over the side of the couch to pick something up.  States she felt a pop in her anterior/lower part of her chest.  Pain worse with movements.  Does not feel short of breath but does have pain with breathing.  No feeling further popping in her chest.  No lightheadedness or dizziness.  States that happened around 2:00 and she was tried to tough it out but pain is gotten worse.  No bruising.  Had not had pains like this before    Past Medical History:  Diagnosis Date  . Arthritis   . Breast cancer ([place])    left breast - chemo and radiation  . Diabetes mellitus without complication ([place])   . Hypertension   . 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  . Panic attacks   . Personal history of chemotherapy   . Personal history of radiation therapy   . Sleep apnea     Patient Active Problem List   Diagnosis Date Noted  . Cough with expectoration 02/27/2015  . Benign essential HTN 02/19/2015  . Heartburn   . Gastritis   . Change in bowel habits   . Benign neoplasm of ascending colon   . Anxiety 10/26/2013  . 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 Essential (primary) hypertension 10/26/2013  . 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 Extreme obesity 10/26/2013  . Bipolar affective disorder ([place]) 10/26/2013  . Malignant neoplasm of breast ([place]) 10/26/2013  . Morbid obesity ([place]) 10/26/2013  . Diabetes mellitus ([place]) 10/26/2013  . 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 Breast cancer of  upper-outer quadrant of left female breast ([place]) 05/21/2011    Past Surgical History:  Procedure Laterality Date  . BREAST BIOPSY Left 2013   +  . BREAST BIOPSY Right    neg  . BREAST LUMPECTOMY  2013  . BREAST MAMMOSITE Left February 2013  . BREAST REDUCTION SURGERY  2005  . COLONOSCOPY WITH PROPOFOL N/A 01/09/2015   Procedure: COLONOSCOPY WITH PROPOFOL;  Surgeon: Lucilla Lame, MD;  Location: [facility] ENDOSCOPY;  Service: Endoscopy;  Laterality: N/A;  . ESOPHAGOGASTRODUODENOSCOPY (EGD) WITH PROPOFOL N/A 01/09/2015   Procedure: ESOPHAGOGASTRODUODENOSCOPY (EGD) WITH PROPOFOL;  Surgeon: Lucilla Lame, MD;  Location: [facility] ENDOSCOPY;  Service: Endoscopy;  Laterality: N/A;  . OOPHORECTOMY Right 2013  . REDUCTION MAMMAPLASTY       OB History    Gravida  7   Para  6   Term      Preterm      AB  1   Living  6     SAB  1   IAB      Ectopic      Multiple      Live Births           Obstetric Comments  1st Menstrual Cycle:  60  1st Pregnancy:  85        Family History  Problem Relation Age of Onset  . Hypertension Mother   . Heart disease  Mother   . Diabetes Mother   . Breast cancer Cousin     Social History   Tobacco Use  . Smoking status: Former Smoker    Years: 26.00  . Smokeless tobacco: Never Used  Vaping Use  . Vaping Use: Never used  Substance Use Topics  . Alcohol use: Yes    Comment: occasionally  . Drug use: No    Home Medications Prior to Admission medications   Medication Sig Start Date End Date Taking? Authorizing Provider  acetaminophen (TYLENOL) 500 MG tablet Take 2 tablets (1,000 mg total) by mouth every 6 (six) hours as needed. 09/02/16   Charlesetta Shanks, MD  amLODipine (NORVASC) 10 MG tablet Take 10 mg by mouth daily. 06/06/16   [provider]  Aspirin-Acetaminophen-Caffeine (GOODY HEADACHE PO) Take 1 packet by mouth as needed.    [provider]  atorvastatin (LIPITOR) 40 MG tablet Take 40 mg by mouth daily.    [provider]  calcium carbonate (TUMS) 500 MG chewable tablet Chew 1 tablet (200 mg of elemental calcium total) by mouth 2 (two) times daily. 09/16/16   Davonna Belling, MD  doxycycline (VIBRAMYCIN) 100 MG capsule Take 1 capsule (100 mg total) by mouth 2 (two) times daily. 02/26/20   Fredia Sorrow, MD  Dulaglutide (TRULICITY) 6.14 ER/1.5QM SOPN Inject 0.75 mg into the skin once a week.    [provider]  hydrochlorothiazide (HYDRODIURIL) 25 MG tablet Take by mouth.    [provider]  ibuprofen (ADVIL,MOTRIN) 800 MG tablet Take 800 mg by mouth every 6 (six) hours as needed.    [provider]  labetalol (NORMODYNE) 200 MG tablet Take 200 mg by mouth 2 (two) times daily. 07/12/19   [provider]  metFORMIN (GLUCOPHAGE) 500 MG tablet Take 500 mg by mouth 2 (two) times daily with a meal.  10/23/13   [provider]  ondansetron (ZOFRAN ODT) 4 MG disintegrating tablet Allow 1-2 tablets to dissolve in your mouth every 8 hours as needed for nausea/vomiting 09/19/16   Hinda Kehr, MD  orphenadrine (NORFLEX) 100 MG tablet TK 1 T PO  BID PRN P 05/09/16   [provider]  venlafaxine XR (EFFEXOR-XR) 150 MG 24 hr capsule Take 150 mg by mouth daily with breakfast.  01/11/14   [provider]    Allergies    Losartan  Review of Systems   Review of Systems  Constitutional: Negative for appetite change.  Respiratory: Negative for shortness of breath.   Cardiovascular: Positive for chest pain.  Gastrointestinal: Negative for abdominal pain.  Genitourinary: Negative for dysuria.  Musculoskeletal: Negative for back pain.  Skin: Negative for rash and wound.  Neurological: Negative for weakness.  Psychiatric/Behavioral: Negative for confusion.    Physical Exam Updated Vital Signs BP 131/68 (BP Location: Right Arm)   Pulse 84   Temp 97.9 F (36.6 C) (Oral)   Resp 20   Ht _0  (1.702 m)   Wt (!) 149.2 kg   LMP  (LMP Unknown)   SpO2  100%   BMI 51.53 kg/m   Physical Exam Vitals and nursing note reviewed.  Constitutional:      Appearance: She is obese.  HENT:     Head: Atraumatic.  Eyes:     Pupils: Pupils are equal, round, and reactive to light.  Cardiovascular:     Rate and Rhythm: Regular rhythm.  Pulmonary:     Breath sounds: No wheezing, rhonchi or rales.  Comments: Tenderness to right anterior lower chest wall.  No deformity. Chest:     Chest wall: Tenderness present.  Abdominal:     Tenderness: There is no abdominal tenderness.  Musculoskeletal:        General: No tenderness.  Skin:    General: Skin is warm.     Capillary Refill: Capillary refill takes less than 2 seconds.  Neurological:     Mental Status: She is alert and oriented to person, place, and time.  Psychiatric:        Mood and Affect: Mood normal.     ED Results / Procedures / Treatments   Labs (all labs ordered are listed, but only abnormal results are displayed) Labs Reviewed - No data to display  EKG None  Radiology DG Ribs Unilateral W/Chest Right  Result Date: 09/12/2020 CLINICAL DATA:  Right rib pain after injury yesterday leaning over. EXAM: RIGHT RIBS AND CHEST - 3+ VIEW COMPARISON:  06/15/2019 FINDINGS: A pain marker overlaps the lower right ribs. No convincing fracture, although limited at the symptomatic level due to soft tissue attenuation. No evidence of hemothorax or pneumothorax. Normal heart size IMPRESSION: No demonstrable fracture.  No pneumothorax or pleural effusion. Electronically Signed   By: Monte Fantasia M.D.   On: 09/12/2020 04:24    Procedures Procedures   Medications Ordered in ED Medications - No data to display  ED Course  I have reviewed the triage vital signs and the nursing notes.  Pertinent labs & imaging results that were available during my care of the patient were reviewed by me and considered in my medical decision making (see chart for details).    MDM Rules/Calculators/A&P                           Patient presents with right chest pain after pop while bending over the couch.  X-ray reassuring no rib fracture seen but easily could have occult fracture or other musculoskeletal injury.  No pneumothorax.  No abdominal tenderness.  Doubt intra-abdominal injury.  However is already on oxycodone and Xtampza ER for chronic pain.  This is not in her medication list.  I feel this is enough to manage the pain at home.  We will follow with her pain management doctor needs more adjustment.  Will discharge home. Final Clinical Impression(s) / ED Diagnoses Final diagnoses:  Blunt chest trauma, initial encounter    Rx / DC Orders ED Discharge Orders    None       Davonna Belling, MD 09/12/20 [phone]

## 2021-02-05 DIAGNOSIS — D122 Benign neoplasm of ascending colon: Secondary | ICD-10-CM | POA: Insufficient documentation

## 2021-02-05 DIAGNOSIS — R12 Heartburn: Secondary | ICD-10-CM | POA: Insufficient documentation

## 2021-02-05 DIAGNOSIS — R194 Change in bowel habit: Secondary | ICD-10-CM | POA: Insufficient documentation

## 2021-02-05 DIAGNOSIS — B019 Varicella without complication: Secondary | ICD-10-CM | POA: Insufficient documentation

## 2021-11-20 ENCOUNTER — Emergency Department (HOSPITAL_BASED_OUTPATIENT_CLINIC_OR_DEPARTMENT_OTHER): Payer: Medicare Other

## 2021-11-20 ENCOUNTER — Other Ambulatory Visit (HOSPITAL_BASED_OUTPATIENT_CLINIC_OR_DEPARTMENT_OTHER): Payer: Self-pay

## 2021-11-20 ENCOUNTER — Other Ambulatory Visit: Payer: Self-pay

## 2021-11-20 ENCOUNTER — Emergency Department (HOSPITAL_BASED_OUTPATIENT_CLINIC_OR_DEPARTMENT_OTHER)
Admission: EM | Admit: 2021-11-20 | Discharge: 2021-11-20 | Disposition: A | Discharge status: Home / Self Care | Payer: Medicare Other | Attending: Emergency Medicine | Admitting: Emergency Medicine

## 2021-11-20 ENCOUNTER — Encounter (HOSPITAL_BASED_OUTPATIENT_CLINIC_OR_DEPARTMENT_OTHER): Payer: Self-pay | Admitting: Pediatrics

## 2021-11-20 DIAGNOSIS — Z79899 Other long term (current) drug therapy: Secondary | ICD-10-CM | POA: Diagnosis not present

## 2021-11-20 DIAGNOSIS — R197 Diarrhea, unspecified: Secondary | ICD-10-CM | POA: Insufficient documentation

## 2021-11-20 DIAGNOSIS — E119 Type 2 diabetes mellitus without complications: Secondary | ICD-10-CM | POA: Diagnosis not present

## 2021-11-20 DIAGNOSIS — I10 Essential (primary) hypertension: Secondary | ICD-10-CM | POA: Diagnosis not present

## 2021-11-20 DIAGNOSIS — Z853 Personal history of malignant neoplasm of breast: Secondary | ICD-10-CM | POA: Insufficient documentation

## 2021-11-20 DIAGNOSIS — A5901 Trichomonal vulvovaginitis: Secondary | ICD-10-CM | POA: Diagnosis not present

## 2021-11-20 DIAGNOSIS — Z7984 Long term (current) use of oral hypoglycemic drugs: Secondary | ICD-10-CM | POA: Diagnosis not present

## 2021-11-20 DIAGNOSIS — R109 Unspecified abdominal pain: Secondary | ICD-10-CM | POA: Diagnosis present

## 2021-11-20 LAB — URINALYSIS, MICROSCOPIC (REFLEX)

## 2021-11-20 LAB — COMPREHENSIVE METABOLIC PANEL
ALT: 15 U/L (ref 0–44)
AST: 16 U/L (ref 15–41)
Albumin: 3.6 g/dL (ref 3.5–5.0)
Alkaline Phosphatase: 75 U/L (ref 38–126)
Anion gap: 8 (ref 5–15)
BUN: 13 mg/dL (ref 6–20)
CO2: 27 mmol/L (ref 22–32)
Calcium: 8.6 mg/dL — ABNORMAL LOW (ref 8.9–10.3)
Chloride: 103 mmol/L (ref 98–111)
Creatinine, Ser: 0.74 mg/dL (ref 0.44–1.00)
GFR, Estimated: >60 mL/min (ref >60)
Glucose, Bld: 129 mg/dL — ABNORMAL HIGH (ref 70–99)
Potassium: 3.2 mmol/L — ABNORMAL LOW (ref 3.5–5.1)
Sodium: 138 mmol/L (ref 135–145)
Total Bilirubin: 0.4 mg/dL (ref 0.3–1.2)
Total Protein: 7.2 g/dL (ref 6.5–8.1)

## 2021-11-20 LAB — CBC
HCT: 37.4 % (ref 36.0–46.0)
Hemoglobin: 12.1 g/dL (ref 12.0–15.0)
MCH: 29.6 pg (ref 26.0–34.0)
MCHC: 32.4 g/dL (ref 30.0–36.0)
MCV: 91.4 fL (ref 80.0–100.0)
Platelets: 369 10*3/uL (ref 150–400)
RBC: 4.09 MIL/uL (ref 3.87–5.11)
RDW: 13.3 % (ref 11.5–15.5)
WBC: 10.4 10*3/uL (ref 4.0–10.5)
nRBC: 0 % (ref 0.0–0.2)

## 2021-11-20 LAB — URINALYSIS, ROUTINE W REFLEX MICROSCOPIC
Bilirubin Urine: NEGATIVE
Glucose, UA: NEGATIVE mg/dL
Hgb urine dipstick: NEGATIVE
Ketones, ur: NEGATIVE mg/dL
Nitrite: NEGATIVE
Protein, ur: NEGATIVE mg/dL
Specific Gravity, Urine: 1.025 (ref 1.005–1.030)
pH: 5.5 (ref 5.0–8.0)

## 2021-11-20 LAB — WET PREP, GENITAL
Clue Cells Wet Prep HPF POC: NONE SEEN
Sperm: NONE SEEN
WBC, Wet Prep HPF POC: >=10 — AB (ref <10)
Yeast Wet Prep HPF POC: NONE SEEN

## 2021-11-20 LAB — LIPASE, BLOOD: Lipase: 21 U/L (ref 11–51)

## 2021-11-20 MED ORDER — LACTATED RINGERS IV BOLUS
1000.0000 mL | Freq: Once | INTRAVENOUS | Status: AC
  Administered 2021-11-20: 1000 mL via INTRAVENOUS

## 2021-11-20 MED ORDER — IOHEXOL 300 MG/ML  SOLN
100.0000 mL | Freq: Once | INTRAMUSCULAR | Status: AC | PRN
  Administered 2021-11-20: 100 mL via INTRAVENOUS

## 2021-11-20 MED ORDER — METRONIDAZOLE 500 MG PO TABS
500.0000 mg | ORAL_TABLET | Freq: Two times a day (BID) | ORAL | 0 refills | Status: DC
  Filled 2021-11-20: qty 14, 7d supply, fill #0

## 2021-11-20 NOTE — ED Notes (Signed)
Patient transported to CT 

## 2021-11-20 NOTE — ED Triage Notes (Signed)
C/o left fore arm pain, comes and go in nature; c/o lower abdominal pain, NVD, since Monday; endorsed some vaginal discharge as well.

## 2021-11-20 NOTE — ED Provider Notes (Signed)
[facility] EMERGENCY DEPARTMENT Provider Note   CSN: [id number] Arrival date & time: 11/20/21  0749     History  Chief Complaint  Patient presents with   Abdominal Pain   Arm Pain    Sherry Price is a 51 y.o. female.  Patient is a 51 year old female with a history of diabetes, hypertension, prior breast cancer status post chemo and radiation who is presenting today with 3-day history of multiple symptoms.  Patient reports that on Monday evening about 6 PM is when she started feeling unwell.  She describes diarrhea, diffuse abdominal pain and cramping and nausea that lasted the rest of the evening on Monday but has not resolved completely since that time.  She did not have any vomiting or fever.  She reports she has not had an appetite and has not really had anything to eat until yesterday evening she tried to eat something but it made her abdominal pain worse.  As soon as she started eating as well she had diarrhea again.  She has not noticed any blood in her stool and has no known sick contacts or bad food exposure.  She denies any dysuria, frequency or urgency but since Monday has also noticed some increased vaginal discharge that she describes as milky.  She is not having any vaginal itching or burning.  She has not had any menses since having treatment for her breast cancer.  She is currently sexually active with only 1 partner for the last 2 years but does not use protection.  In addition to the symptoms she reports on Monday she is also had some intermittent burning pain in her left arm that comes and goes usually 1-2 times a day and last for 10 or 15 minutes but not long enough for her to take anything for it.  She denies any injury or symptoms of this happening in the past.  She has had some increased burping and occasionally some chest tightness but denies any shortness of breath or cough.  She has had an oophorectomy but no other abdominal surgeries.  The history is  provided by the patient.  Abdominal Pain Arm Pain Associated symptoms include abdominal pain.       Home Medications Prior to Admission medications   Medication Sig Start Date End Date Taking? Authorizing Provider  metroNIDAZOLE (FLAGYL) 500 MG tablet Take 1 tablet (500 mg total) by mouth 2 (two) times daily. 11/20/21  Yes Blanchie Dessert, MD  acetaminophen (TYLENOL) 500 MG tablet Take 2 tablets (1,000 mg total) by mouth every 6 (six) hours as needed. 09/02/16   Charlesetta Shanks, MD  amLODipine (NORVASC) 10 MG tablet Take 10 mg by mouth daily. 06/06/16   [provider]  Aspirin-Acetaminophen-Caffeine (GOODY HEADACHE PO) Take 1 packet by mouth as needed.    [provider]  atorvastatin (LIPITOR) 40 MG tablet Take 40 mg by mouth daily.    [provider]  calcium carbonate (TUMS) 500 MG chewable tablet Chew 1 tablet (200 mg of elemental calcium total) by mouth 2 (two) times daily. 09/16/16   Davonna Belling, MD  doxycycline (VIBRAMYCIN) 100 MG capsule Take 1 capsule (100 mg total) by mouth 2 (two) times daily. 02/26/20   Fredia Sorrow, MD  Dulaglutide (TRULICITY) 7.68 TL/5.7WI SOPN Inject 0.75 mg into the skin once a week.    [provider]  hydrochlorothiazide (HYDRODIURIL) 25 MG tablet Take by mouth.    [provider]  ibuprofen (ADVIL,MOTRIN) 800 MG tablet Take 800  mg by mouth every 6 (six) hours as needed.    [provider]  labetalol (NORMODYNE) 200 MG tablet Take 200 mg by mouth 2 (two) times daily. 07/12/19   [provider]  metFORMIN (GLUCOPHAGE) 500 MG tablet Take 500 mg by mouth 2 (two) times daily with a meal.  10/23/13   [provider]  ondansetron (ZOFRAN ODT) 4 MG disintegrating tablet Allow 1-2 tablets to dissolve in your mouth every 8 hours as needed for nausea/vomiting 09/19/16   Hinda Kehr, MD  orphenadrine (NORFLEX) 100 MG tablet TK 1 T PO  BID PRN P 05/09/16   [provider]  venlafaxine  XR (EFFEXOR-XR) 150 MG 24 hr capsule Take 150 mg by mouth daily with breakfast.  01/11/14   [provider]      Allergies    Losartan    Review of Systems   Review of Systems  Gastrointestinal:  Positive for abdominal pain.    Physical Exam Updated Vital Signs BP 115/78   Pulse 73   Temp 97.7 F (36.5 C) (Oral)   Resp 14   Ht '5\' 7"'$  (1.702 m)   Wt (!) 141.1 kg   LMP  (LMP Unknown)   SpO2 98%   BMI 48.71 kg/m  Physical Exam Vitals and nursing note reviewed.  Constitutional:      General: She is not in acute distress.    Appearance: She is well-developed.  HENT:     Head: Normocephalic and atraumatic.  Eyes:     Pupils: Pupils are equal, round, and reactive to light.  Cardiovascular:     Rate and Rhythm: Normal rate and regular rhythm.     Heart sounds: Normal heart sounds. No murmur heard.    No friction rub.  Pulmonary:     Effort: Pulmonary effort is normal.     Breath sounds: Normal breath sounds. No wheezing or rales.  Abdominal:     General: Bowel sounds are normal. There is no distension.     Palpations: Abdomen is soft.     Tenderness: There is generalized abdominal tenderness. There is no right CVA tenderness, left CVA tenderness, guarding or rebound.     Comments: Generalized abdominal tenderness but more significant around the periumbilical and right-sided abdomen  Musculoskeletal:        General: No tenderness. Normal range of motion.     Comments: No edema  Skin:    General: Skin is warm and dry.     Findings: No rash.  Neurological:     Mental Status: She is alert and oriented to person, place, and time.     Cranial Nerves: No cranial nerve deficit.  Psychiatric:        Behavior: Behavior normal.     ED Results / Procedures / Treatments   Labs (all labs ordered are listed, but only abnormal results are displayed) Labs Reviewed  WET PREP, GENITAL - Abnormal; Notable for the following components:      Result Value   Trich, Wet Prep  PRESENT (*)    WBC, Wet Prep HPF POC >=10 (*)    All other components within normal limits  COMPREHENSIVE METABOLIC PANEL - Abnormal; Notable for the following components:   Potassium 3.2 (*)    Glucose, Bld 129 (*)    Calcium 8.6 (*)    All other components within normal limits  URINALYSIS, ROUTINE W REFLEX MICROSCOPIC - Abnormal; Notable for the following components:   Leukocytes,Ua MODERATE (*)  All other components within normal limits  URINALYSIS, MICROSCOPIC (REFLEX) - Abnormal; Notable for the following components:   Bacteria, UA FEW (*)    Trichomonas, UA PRESENT (*)    All other components within normal limits  LIPASE, BLOOD  CBC  GC/CHLAMYDIA PROBE AMP ([place]) NOT AT [facility]    EKG EKG Interpretation  Date/Time:  Wednesday November 20 2021 08:44:34 EDT Ventricular Rate:  74 PR Interval:  162 QRS Duration: 110 QT Interval:  427 QTC Calculation: 474 R Axis:   32 Text Interpretation: Sinus rhythm No significant change since last tracing Confirmed by Blanchie Dessert [phone]) on 11/20/2021 9:01:04 AM  Radiology CT ABDOMEN PELVIS W CONTRAST  Result Date: 11/20/2021 CLINICAL DATA:  Abdominal pain, acute, nonlocalized. Question colitis. EXAM: CT ABDOMEN AND PELVIS WITH CONTRAST TECHNIQUE: Multidetector CT imaging of the abdomen and pelvis was performed using the standard protocol following bolus administration of intravenous contrast. RADIATION DOSE REDUCTION: This exam was performed according to the departmental dose-optimization program which includes automated exposure control, adjustment of the mA and/or kV according to patient size and/or use of iterative reconstruction technique. CONTRAST:  112m OMNIPAQUE IOHEXOL 300 MG/ML  SOLN COMPARISON:  08/30/2019.  01/02/2015. FINDINGS: Lower chest: Lung bases are clear.  No pleural or pericardial fluid. Hepatobiliary: No liver parenchymal abnormality. No calcified gallstones. Pancreas: Normal.  Small duodenal diverticulum. Spleen:  Normal Adrenals/Urinary Tract: Adrenal glands are normal. Right kidney contains a 3 mm nonobstructing stone in the lower pole but is otherwise normal. Left kidney shows the presence of a large cyst along the lateral margin measuring 12.8 x 12.6 x 8.7 cm. This is slightly larger than was seen in 2016 when the cyst measured 10.7 x 10.6 x 8.2 cm. The bladder is normal. Stomach/Bowel: Stomach and small intestine are normal. Normal appearance of the colon. No sign of colitis or diverticulitis. Vascular/Lymphatic: Aorta and IVC are normal.  No adenopathy. Reproductive: Uterus and adnexal regions appear normal. Other: No free fluid or air. Musculoskeletal: Ordinary lumbar degenerative changes are present. IMPRESSION: No acute CT finding. No evidence of bowel inflammation or obstruction. No evidence of pelvic organ pathology by CT. Large left renal cyst, increased in size since 2016. This appears to have some compressive effect upon the left kidney, though the left renal parenchymal tissue appears to function and there is no obstruction. Nonobstructing 3 mm stone in the lower pole of the right kidney. Electronically Signed   By: MNelson ChimesM.D.   On: 11/20/2021 11:35    Procedures Procedures    Medications Ordered in ED Medications  lactated ringers bolus 1,000 mL (0 mLs Intravenous Stopped 11/20/21 1017)  iohexol (OMNIPAQUE) 300 MG/ML solution 100 mL (100 mLs Intravenous Contrast Given 11/20/21 1116)    ED Course/ Medical Decision Making/ A&P                           Medical Decision Making Amount and/or Complexity of Data Reviewed Labs: ordered. Radiology: ordered.  Risk Prescription drug management.   Pt with multiple medical problems and comorbidities and presenting today with a complaint that caries a high risk for morbidity and mortality.  Here today with multiple symptoms most significant is abdominal pain and diarrhea.  Patient is concern for possible new vaginal pathology however she is  fairly low risk with the same partner for 2 years but does not use protection.  She denies any urinary symptoms but anytime she eats she does continue to  have diarrhea.  Concern for possible diverticulitis, colitis, viral gastroenteritis versus foodborne illness.  Patient is also having some deep aching in her arm with some occasional chest discomfort.  This all could be GI related and referred pain however we will also do a screening EKG to ensure no significant findings for ACS as patient does have several risk factors.  She denies any alcohol use, tobacco use, no significant family history for cardiac disease.  Low suspicion for PE.  Patient given IV fluids.  Labs and imaging are pending. I independently interpreted patient's labs today and wet prep and UA are consistent with trichomonas without evidence of infection consistent with UTI, lipase, CBC are within normal limits and patient has mild hypokalemia of 3.2 but otherwise normal renal function. I have independently visualized and interpreted pt's images today.  CT abdomen pelvis without evidence of renal stone, hydronephrosis or appendicitis.  Patient does appear to have a cyst on her left kidney.  Radiology reports no acute CT findings but gradually increased size of cyst since 2016 and a nonobstructive 3 mm stone in the lower pole of the right kidney.  Findings were discussed with the patient.  She is tolerating p.o.'s and well-appearing.  Feel that patient is stable for discharge home.  We will treat with Flagyl and encouraged her partner to be treated as well.  Patient will also follow-up with her PCP as she is known about the renal cyst in the past.  Patient given return precautions.  At this time no indication for admission and she is stable for discharge.          Final Clinical Impression(s) / ED Diagnoses Final diagnoses:  Trichomonas vaginalis (TV) infection  Diarrhea of presumed infectious origin    Rx / DC Orders ED Discharge  Orders          Ordered    metroNIDAZOLE (FLAGYL) 500 MG tablet  2 times daily        11/20/21 1202              Blanchie Dessert, MD 11/20/21 1508

## 2021-11-20 NOTE — Discharge Instructions (Addendum)
All the blood work was normal today.  Your CAT scan looked normal other than the cyst you have on your kidney.  No sign of urinary tract infection but you do have trichomonas today which will require treatment with antibiotics and your partner also needs to be treated.  No sexual contact for 2 weeks.

## 2021-11-21 LAB — GC/CHLAMYDIA PROBE AMP (~~LOC~~) NOT AT ARMC
Chlamydia: NEGATIVE
Comment: NEGATIVE
Comment: NORMAL
Neisseria Gonorrhea: NEGATIVE

## 2022-02-18 ENCOUNTER — Emergency Department (HOSPITAL_BASED_OUTPATIENT_CLINIC_OR_DEPARTMENT_OTHER)
Admission: EM | Admit: 2022-02-18 | Discharge: 2022-02-18 | Disposition: A | Discharge status: Home / Self Care | Payer: 59 | Attending: Emergency Medicine | Admitting: Emergency Medicine

## 2022-02-18 ENCOUNTER — Emergency Department (HOSPITAL_BASED_OUTPATIENT_CLINIC_OR_DEPARTMENT_OTHER): Payer: 59

## 2022-02-18 ENCOUNTER — Other Ambulatory Visit: Payer: Self-pay

## 2022-02-18 ENCOUNTER — Encounter (HOSPITAL_BASED_OUTPATIENT_CLINIC_OR_DEPARTMENT_OTHER): Payer: Self-pay | Admitting: Emergency Medicine

## 2022-02-18 DIAGNOSIS — Z87891 Personal history of nicotine dependence: Secondary | ICD-10-CM | POA: Diagnosis not present

## 2022-02-18 DIAGNOSIS — Z7985 Long-term (current) use of injectable non-insulin antidiabetic drugs: Secondary | ICD-10-CM | POA: Insufficient documentation

## 2022-02-18 DIAGNOSIS — K644 Residual hemorrhoidal skin tags: Secondary | ICD-10-CM | POA: Diagnosis not present

## 2022-02-18 DIAGNOSIS — I10 Essential (primary) hypertension: Secondary | ICD-10-CM | POA: Diagnosis not present

## 2022-02-18 DIAGNOSIS — Z7984 Long term (current) use of oral hypoglycemic drugs: Secondary | ICD-10-CM | POA: Diagnosis not present

## 2022-02-18 DIAGNOSIS — Z853 Personal history of malignant neoplasm of breast: Secondary | ICD-10-CM | POA: Diagnosis not present

## 2022-02-18 DIAGNOSIS — R1031 Right lower quadrant pain: Secondary | ICD-10-CM

## 2022-02-18 DIAGNOSIS — E119 Type 2 diabetes mellitus without complications: Secondary | ICD-10-CM | POA: Insufficient documentation

## 2022-02-18 DIAGNOSIS — Z79899 Other long term (current) drug therapy: Secondary | ICD-10-CM | POA: Insufficient documentation

## 2022-02-18 LAB — COMPREHENSIVE METABOLIC PANEL
ALT: 16 U/L (ref 0–44)
AST: 16 U/L (ref 15–41)
Albumin: 3.9 g/dL (ref 3.5–5.0)
Alkaline Phosphatase: 79 U/L (ref 38–126)
Anion gap: 9 (ref 5–15)
BUN: 24 mg/dL — ABNORMAL HIGH (ref 6–20)
CO2: 27 mmol/L (ref 22–32)
Calcium: 9 mg/dL (ref 8.9–10.3)
Chloride: 103 mmol/L (ref 98–111)
Creatinine, Ser: 0.95 mg/dL (ref 0.44–1.00)
GFR, Estimated: >60 mL/min (ref >60)
Glucose, Bld: 98 mg/dL (ref 70–99)
Potassium: 4 mmol/L (ref 3.5–5.1)
Sodium: 139 mmol/L (ref 135–145)
Total Bilirubin: 0.4 mg/dL (ref 0.3–1.2)
Total Protein: 7.6 g/dL (ref 6.5–8.1)

## 2022-02-18 LAB — URINALYSIS, ROUTINE W REFLEX MICROSCOPIC
Bilirubin Urine: NEGATIVE
Glucose, UA: NEGATIVE mg/dL
Hgb urine dipstick: NEGATIVE
Ketones, ur: NEGATIVE mg/dL
Nitrite: NEGATIVE
Protein, ur: NEGATIVE mg/dL
Specific Gravity, Urine: >=1.03 (ref 1.005–1.030)
pH: 5.5 (ref 5.0–8.0)

## 2022-02-18 LAB — CBC
HCT: 38.9 % (ref 36.0–46.0)
Hemoglobin: 12.6 g/dL (ref 12.0–15.0)
MCH: 29.6 pg (ref 26.0–34.0)
MCHC: 32.4 g/dL (ref 30.0–36.0)
MCV: 91.3 fL (ref 80.0–100.0)
Platelets: 408 10*3/uL — ABNORMAL HIGH (ref 150–400)
RBC: 4.26 MIL/uL (ref 3.87–5.11)
RDW: 13.5 % (ref 11.5–15.5)
WBC: 11.1 10*3/uL — ABNORMAL HIGH (ref 4.0–10.5)
nRBC: 0 % (ref 0.0–0.2)

## 2022-02-18 LAB — URINALYSIS, MICROSCOPIC (REFLEX): RBC / HPF: NONE SEEN RBC/hpf (ref 0–5)

## 2022-02-18 LAB — PREGNANCY, URINE: Preg Test, Ur: NEGATIVE

## 2022-02-18 LAB — LIPASE, BLOOD: Lipase: 23 U/L (ref 11–51)

## 2022-02-18 MED ORDER — IOHEXOL 300 MG/ML  SOLN
125.0000 mL | Freq: Once | INTRAMUSCULAR | Status: AC | PRN
  Administered 2022-02-18: 125 mL via INTRAVENOUS

## 2022-02-18 MED ORDER — HYDROMORPHONE HCL 1 MG/ML IJ SOLN
0.5000 mg | Freq: Once | INTRAMUSCULAR | Status: AC
  Administered 2022-02-18: 0.5 mg via INTRAVENOUS
  Filled 2022-02-18: qty 1

## 2022-02-18 MED ORDER — ONDANSETRON HCL 4 MG/2ML IJ SOLN
4.0000 mg | Freq: Once | INTRAMUSCULAR | Status: AC
  Administered 2022-02-18: 4 mg via INTRAVENOUS
  Filled 2022-02-18: qty 2

## 2022-02-18 MED ORDER — SODIUM CHLORIDE 0.9 % IV SOLN: INTRAVENOUS | Status: DC

## 2022-02-18 NOTE — ED Notes (Signed)
Lab notified of UA orders.

## 2022-02-18 NOTE — ED Provider Notes (Signed)
[facility] EMERGENCY DEPARTMENT Provider Note   CSN: [id number] Arrival date & time: 02/18/22  1317     History  Chief Complaint  Patient presents with   Abdominal Pain    RLQ    Sherry Price is a 51 y.o. female.  Patient with a complaint of right lower quadrant abdominal pain for 2 days.  Has had some generalized abdominal bloating and some constipation for several days.  Associated with nausea no vomiting no diarrhea.  Also feels a mass at her rectal area that is nontender.  Patient is never had anything like that before.  Questionable blood in her urine.  No blood in her bowel movements.  Past medical history significant for diabetes hypertension history of chemotherapy for breast cancer.  Patient is a former smoker cigarettes       Home Medications Prior to Admission medications   Medication Sig Start Date End Date Taking? Authorizing Provider  acetaminophen (TYLENOL) 500 MG tablet Take 2 tablets (1,000 mg total) by mouth every 6 (six) hours as needed. 09/02/16   Charlesetta Shanks, MD  amLODipine (NORVASC) 10 MG tablet Take 10 mg by mouth daily. 06/06/16   [provider]  Aspirin-Acetaminophen-Caffeine (GOODY HEADACHE PO) Take 1 packet by mouth as needed.    [provider]  atorvastatin (LIPITOR) 40 MG tablet Take 40 mg by mouth daily.    [provider]  calcium carbonate (TUMS) 500 MG chewable tablet Chew 1 tablet (200 mg of elemental calcium total) by mouth 2 (two) times daily. 09/16/16   Davonna Belling, MD  doxycycline (VIBRAMYCIN) 100 MG capsule Take 1 capsule (100 mg total) by mouth 2 (two) times daily. 02/26/20   Fredia Sorrow, MD  Dulaglutide (TRULICITY) 8.18 HU/3.1SH SOPN Inject 0.75 mg into the skin once a week.    [provider]  hydrochlorothiazide (HYDRODIURIL) 25 MG tablet Take by mouth.    [provider]  ibuprofen (ADVIL,MOTRIN) 800 MG tablet Take 800 mg by mouth every 6 (six) hours as needed.     [provider]  labetalol (NORMODYNE) 200 MG tablet Take 200 mg by mouth 2 (two) times daily. 07/12/19   [provider]  metFORMIN (GLUCOPHAGE) 500 MG tablet Take 500 mg by mouth 2 (two) times daily with a meal.  10/23/13   [provider]  metroNIDAZOLE (FLAGYL) 500 MG tablet Take 1 tablet (500 mg total) by mouth 2 (two) times daily. 11/20/21   Blanchie Dessert, MD  ondansetron (ZOFRAN ODT) 4 MG disintegrating tablet Allow 1-2 tablets to dissolve in your mouth every 8 hours as needed for nausea/vomiting 09/19/16   Hinda Kehr, MD  orphenadrine (NORFLEX) 100 MG tablet TK 1 T PO  BID PRN P 05/09/16   [provider]  venlafaxine XR (EFFEXOR-XR) 150 MG 24 hr capsule Take 150 mg by mouth daily with breakfast.  01/11/14   [provider]      Allergies    Losartan    Review of Systems   Review of Systems  Constitutional:  Negative for chills and fever.  HENT:  Negative for ear pain and sore throat.   Eyes:  Negative for pain and visual disturbance.  Respiratory:  Negative for cough and shortness of breath.   Cardiovascular:  Negative for chest pain and palpitations.  Gastrointestinal:  Positive for abdominal pain and nausea. Negative for anal bleeding, blood in stool, diarrhea, rectal pain and vomiting.  Genitourinary:  Negative for dysuria and hematuria.  Musculoskeletal:  Negative  for arthralgias and back pain.  Skin:  Negative for color change and rash.  Neurological:  Negative for seizures and syncope.  All other systems reviewed and are negative.   Physical Exam Updated Vital Signs BP 124/83   Pulse 72   Temp 98 F (36.7 C) (Oral)   Resp 17   Ht 1.702 m ('5\' 7"'$ )   Wt (!) 147 kg   LMP  (LMP Unknown)   SpO2 97%   BMI 50.75 kg/m  Physical Exam Vitals and nursing note reviewed.  Constitutional:      General: She is not in acute distress.    Appearance: She is well-developed. She is not ill-appearing or toxic-appearing.  HENT:      Head: Normocephalic and atraumatic.  Eyes:     Conjunctiva/sclera: Conjunctivae normal.  Cardiovascular:     Rate and Rhythm: Normal rate and regular rhythm.     Heart sounds: No murmur heard. Pulmonary:     Effort: Pulmonary effort is normal. No respiratory distress.     Breath sounds: Normal breath sounds.  Abdominal:     General: Abdomen is flat. There is no distension.     Palpations: Abdomen is soft.     Tenderness: There is abdominal tenderness in the right lower quadrant. There is guarding.     Hernia: No hernia is present.  Genitourinary:    Rectum: Guaiac result negative. Mass present. No tenderness.     Comments: Rectal exam no gross blood.  No stool impaction in the vault.  Patient with an external hemorrhoid measuring about 1.5 cm.  Nontender not thrombosed. Musculoskeletal:        General: No swelling.     Cervical back: Neck supple.  Skin:    General: Skin is warm and dry.     Capillary Refill: Capillary refill takes less than 2 seconds.  Neurological:     General: No focal deficit present.     Mental Status: She is alert and oriented to person, place, and time.  Psychiatric:        Mood and Affect: Mood normal.     ED Results / Procedures / Treatments   Labs (all labs ordered are listed, but only abnormal results are displayed) Labs Reviewed  COMPREHENSIVE METABOLIC PANEL - Abnormal; Notable for the following components:      Result Value   BUN 24 (*)    All other components within normal limits  CBC - Abnormal; Notable for the following components:   WBC 11.1 (*)    Platelets 408 (*)    All other components within normal limits  URINALYSIS, ROUTINE W REFLEX MICROSCOPIC - Abnormal; Notable for the following components:   APPearance HAZY (*)    Leukocytes,Ua MODERATE (*)    All other components within normal limits  URINALYSIS, MICROSCOPIC (REFLEX) - Abnormal; Notable for the following components:   Bacteria, UA FEW (*)    All other components within  normal limits  LIPASE, BLOOD  PREGNANCY, URINE    EKG None  Radiology CT Abdomen Pelvis W Contrast  Result Date: 02/18/2022 CLINICAL DATA:  RIGHT lower quadrant pain. EXAM: CT ABDOMEN AND PELVIS WITH CONTRAST TECHNIQUE: Multidetector CT imaging of the abdomen and pelvis was performed using the standard protocol following bolus administration of intravenous contrast. RADIATION DOSE REDUCTION: This exam was performed according to the departmental dose-optimization program which includes automated exposure control, adjustment of the mA and/or kV according to patient size and/or use of iterative reconstruction technique. CONTRAST:  133m  OMNIPAQUE IOHEXOL 300 MG/ML  SOLN COMPARISON:  CT 11/20/2021 FINDINGS: Lower chest: Lung bases are clear. Hepatobiliary: No focal hepatic lesion. Gallbladder appears normal. No biliary duct dilatation. Common bile duct is normal. Pancreas: Pancreas is normal. No ductal dilatation. No pancreatic inflammation. Spleen: Normal spleen Adrenals/urinary tract: Adrenal glands normal. Large bilobed LEFT renal cyst has simple fluid attenuation. Cyst measures 9.3 x 12.8 cm has a thin calcified septation barely perceptible on CT. Findings are unchanged from comparison CT. Ureters and bladder normal Stomach/Bowel: . Stomach, small bowel, appendix, and cecum are normal. The colon and rectosigmoid colon are normal. Vascular/Lymphatic: Abdominal aorta is normal caliber. No periportal or retroperitoneal adenopathy. No pelvic adenopathy. Reproductive: Uterus and adnexa unremarkable. Other: No inguinal hernia or ventral hernia. Musculoskeletal: No aggressive osseous lesion. IMPRESSION: 1. No acute findings in the abdomen pelvis. 2. Large Bosniak 2 LEFT renal cyst. No specific follow-up recommended. 3. Normal gallbladder and appendix. 4. No abdominal wall hernia. Electronically Signed   By: Suzy Bouchard M.D.   On: 02/18/2022 16:58    Procedures Procedures    Medications Ordered in  ED Medications  0.9 %  sodium chloride infusion (has no administration in time range)  ondansetron (ZOFRAN) injection 4 mg (4 mg Intravenous Given 02/18/22 1710)  HYDROmorphone (DILAUDID) injection 0.5 mg (0.5 mg Intravenous Given 02/18/22 1711)  iohexol (OMNIPAQUE) 300 MG/ML solution 125 mL (125 mLs Intravenous Contrast Given 02/18/22 1632)    ED Course/ Medical Decision Making/ A&P                           Medical Decision Making Amount and/or Complexity of Data Reviewed Labs: ordered. Radiology: ordered.  Risk Prescription drug management.    Patient perianal mass is a nonthrombosed external hemorrhoid.  Patient's labs lipase normal complete metabolic panel liver function test normal.  Renal function GFR greater than 60 CBC white count 11,000 hemoglobin 12.6.  Patient with tenderness right lower quadrant with some guarding.  Raising concerns for possible appendicitis.  CT scan no acute findings in the abdomen.  Large left renal cysts no significant follow-up required.  Normal gallbladder and appendix.  No hernia.  Also no evidence of any significant constipation.  Urinalysis normal.  No blood in the urine no signs of urinary tract infection.  Patient given information about the external hemorrhoid and the possibility of thrombosis.  Final Clinical Impression(s) / ED Diagnoses Final diagnoses:  Right lower quadrant abdominal pain  External hemorrhoid    Rx / DC Orders ED Discharge Orders     None         Fredia Sorrow, MD 02/18/22 1900

## 2022-02-18 NOTE — ED Notes (Signed)
Pt refused DC VS.

## 2022-02-18 NOTE — ED Triage Notes (Signed)
RLQ pain , constipation x 4 days , reports wiped blood after se voided yesterday .  Feels mass in rectum yet not painful.

## 2022-02-18 NOTE — ED Notes (Signed)
Pt arranging ride home due to medications administered.

## 2022-02-18 NOTE — Discharge Instructions (Addendum)
Follow-up with your doctor.  We talked about the finding of the external hemorrhoid.  No significant treatment required for that at this point in time.  As we discussed they can become thrombosed and become painful.  Then soaking them is helpful.  Work-up here today urinalysis normal no blood in it.  No acute findings on the CT scan of the abdomen.  Return for any new or worse symptoms.

## 2022-04-01 DIAGNOSIS — M170 Bilateral primary osteoarthritis of knee: Secondary | ICD-10-CM | POA: Insufficient documentation

## 2022-05-14 ENCOUNTER — Ambulatory Visit (INDEPENDENT_AMBULATORY_CARE_PROVIDER_SITE_OTHER): Payer: Medicare Other | Admitting: Nurse Practitioner

## 2022-05-14 ENCOUNTER — Encounter: Payer: Self-pay | Admitting: Nurse Practitioner

## 2022-05-14 VITALS — BP 144/80 | HR 93 | Temp 98.7°F | Ht 65.0 in | Wt 326.4 lb

## 2022-05-14 DIAGNOSIS — Z1231 Encounter for screening mammogram for malignant neoplasm of breast: Secondary | ICD-10-CM

## 2022-05-14 DIAGNOSIS — E559 Vitamin D deficiency, unspecified: Secondary | ICD-10-CM

## 2022-05-14 DIAGNOSIS — Z124 Encounter for screening for malignant neoplasm of cervix: Secondary | ICD-10-CM

## 2022-05-14 DIAGNOSIS — E782 Mixed hyperlipidemia: Secondary | ICD-10-CM | POA: Diagnosis not present

## 2022-05-14 DIAGNOSIS — M25562 Pain in left knee: Secondary | ICD-10-CM

## 2022-05-14 DIAGNOSIS — M25561 Pain in right knee: Secondary | ICD-10-CM

## 2022-05-14 DIAGNOSIS — Z1329 Encounter for screening for other suspected endocrine disorder: Secondary | ICD-10-CM

## 2022-05-14 DIAGNOSIS — G8929 Other chronic pain: Secondary | ICD-10-CM | POA: Insufficient documentation

## 2022-05-14 DIAGNOSIS — I10 Essential (primary) hypertension: Secondary | ICD-10-CM | POA: Diagnosis not present

## 2022-05-14 DIAGNOSIS — E119 Type 2 diabetes mellitus without complications: Secondary | ICD-10-CM | POA: Diagnosis not present

## 2022-05-14 DIAGNOSIS — E538 Deficiency of other specified B group vitamins: Secondary | ICD-10-CM | POA: Insufficient documentation

## 2022-05-14 DIAGNOSIS — F419 Anxiety disorder, unspecified: Secondary | ICD-10-CM

## 2022-05-14 LAB — CBC WITH DIFFERENTIAL/PLATELET
Basophils Absolute: 0.1 10*3/uL (ref 0.0–0.1)
Basophils Relative: 1.5 % (ref 0.0–3.0)
Eosinophils Absolute: 0.3 10*3/uL (ref 0.0–0.7)
Eosinophils Relative: 2.7 % (ref 0.0–5.0)
HCT: 40 % (ref 36.0–46.0)
Hemoglobin: 13.3 g/dL (ref 12.0–15.0)
Lymphocytes Relative: 19 % (ref 12.0–46.0)
Lymphs Abs: 1.9 10*3/uL (ref 0.7–4.0)
MCHC: 33.2 g/dL (ref 30.0–36.0)
MCV: 91.4 fl (ref 78.0–100.0)
Monocytes Absolute: 0.5 10*3/uL (ref 0.1–1.0)
Monocytes Relative: 5.5 % (ref 3.0–12.0)
Neutro Abs: 7.1 10*3/uL (ref 1.4–7.7)
Neutrophils Relative %: 71.3 % (ref 43.0–77.0)
Platelets: 404 10*3/uL — ABNORMAL HIGH (ref 150.0–400.0)
RBC: 4.38 Mil/uL (ref 3.87–5.11)
RDW: 14 % (ref 11.5–15.5)
WBC: 10 10*3/uL (ref 4.0–10.5)

## 2022-05-14 LAB — LIPID PANEL
Cholesterol: 133 mg/dL (ref 0–200)
HDL: 53 mg/dL (ref >39.00)
LDL Cholesterol: 65 mg/dL (ref 0–99)
NonHDL: 80.42
Total CHOL/HDL Ratio: 3
Triglycerides: 79 mg/dL (ref 0.0–149.0)
VLDL: 15.8 mg/dL (ref 0.0–40.0)

## 2022-05-14 LAB — VITAMIN D 25 HYDROXY (VIT D DEFICIENCY, FRACTURES): VITD: 26.11 ng/mL — ABNORMAL LOW (ref 30.00–100.00)

## 2022-05-14 LAB — COMPREHENSIVE METABOLIC PANEL
ALT: 16 U/L (ref 0–35)
AST: 13 U/L (ref 0–37)
Albumin: 4.2 g/dL (ref 3.5–5.2)
Alkaline Phosphatase: 107 U/L (ref 39–117)
BUN: 11 mg/dL (ref 6–23)
CO2: 32 mEq/L (ref 19–32)
Calcium: 8.9 mg/dL (ref 8.4–10.5)
Chloride: 103 mEq/L (ref 96–112)
Creatinine, Ser: 0.71 mg/dL (ref 0.40–1.20)
GFR: 97.05 mL/min (ref >60.00)
Glucose, Bld: 148 mg/dL — ABNORMAL HIGH (ref 70–99)
Potassium: 3.9 mEq/L (ref 3.5–5.1)
Sodium: 144 mEq/L (ref 135–145)
Total Bilirubin: 0.5 mg/dL (ref 0.2–1.2)
Total Protein: 7.2 g/dL (ref 6.0–8.3)

## 2022-05-14 LAB — TSH: TSH: 0.82 u[IU]/mL (ref 0.35–5.50)

## 2022-05-14 LAB — HEMOGLOBIN A1C: Hgb A1c MFr Bld: 7 % — ABNORMAL HIGH (ref 4.6–6.5)

## 2022-05-14 LAB — VITAMIN B12: Vitamin B-12: 1013 pg/mL — ABNORMAL HIGH (ref 211–911)

## 2022-05-14 MED ORDER — SEMAGLUTIDE(0.25 OR 0.5MG/DOS) 2 MG/3ML ~~LOC~~ SOPN: 0.2500 mg | PEN_INJECTOR | SUBCUTANEOUS | 2 refills | Status: DC

## 2022-05-14 MED ORDER — LABETALOL HCL 200 MG PO TABS: 200.0000 mg | ORAL_TABLET | Freq: Two times a day (BID) | ORAL | 3 refills | Status: DC

## 2022-05-14 MED ORDER — ALPRAZOLAM 0.5 MG PO TABS: 0.5000 mg | ORAL_TABLET | Freq: Every day | ORAL | 2 refills | Status: DC | PRN

## 2022-05-14 NOTE — Assessment & Plan Note (Addendum)
Stable on Xanax 0.5 PRN. Not taking often. Continue. PDMP reviewed.

## 2022-05-14 NOTE — Assessment & Plan Note (Signed)
Continue working with [employer] management services. Encouraged healthy diet and exercise.

## 2022-05-14 NOTE — Assessment & Plan Note (Addendum)
Chronic. Elevated reading today in office. Continue Norvasc, Labetalol, and HCTZ. Refills sent to pharmacy. Labs as outlined. Will monitor.

## 2022-05-14 NOTE — Assessment & Plan Note (Signed)
Will refer to pain management to take over medications. Continue follow up with Ortho.

## 2022-05-14 NOTE — Progress Notes (Signed)
Tomasita Morrow, NP-C Phone: [phone]  Sherry Price is a 52 y.o. female who presents today to establish care. She has no complaints or new concerns. She is needing refills on some of her medications. She reports seeing Ortho for management of bilateral knee pain and weight management services for help with weight loss. She is in need of a new pain management provider to take over her pain medication.  HYPERTENSION Disease Monitoring: Blood pressure range- Not checking Chest pain- No      Dyspnea- No Medications: Compliance- Labetalol, Norvasc and HCTZ Lightheadedness- No   Edema- No  DIABETES Disease Monitoring: Blood Sugar ranges- Not checking Polyuria/phagia/dipsia- No      Optho- No Medications: Compliance- Metformin and Ozempic Hypoglycemic symptoms- No  HYPERLIPIDEMIA Disease Monitoring: See symptoms for Hypertension Medications: Compliance- Lipitor Right upper quadrant pain- No  Muscle aches- No  Diet: Patient is working with weight management on diet, decreasing carbs, she reports over eating/binge eating.  Exercise: Once weekly Pap smear: Due- Needs referral to Gyn Colonoscopy: 2016, 10 year recall Mammogram: Due Family history-  Colon cancer: No  Breast cancer: No  Ovarian cancer: No Menses: Postmenopausal Vaccines-   Flu: UTD  Tetanus: Unsure  Shingles: x 1  COVID19: x 4 HIV screening: Negative Tobacco use: No Alcohol use: Occasionally/socially Illicit Drug use: No Dentist: No Ophthalmology: No   Active Ambulatory Problems    Diagnosis Date Noted   Breast cancer of upper-outer quadrant of left female breast ([place]) 05/21/2011   Anxiety 10/26/2013   Essential (primary) hypertension 10/26/2013   Mixed hyperlipidemia 10/26/2013   Heartburn    Gastritis    Change in bowel habits    Benign neoplasm of ascending colon    Bipolar affective disorder ([place]) 10/26/2013   Malignant neoplasm of breast ([place]) 10/26/2013   Morbid obesity ([place]) 10/26/2013    Type 2 diabetes mellitus ([place]) 10/26/2013   Vitamin D deficiency 05/14/2022   B12 deficiency 05/14/2022   Chronic pain of both knees 05/14/2022   Resolved Ambulatory Problems    Diagnosis Date Noted   Arthritis of knee, degenerative 10/11/2013   Cough with expectoration 02/27/2015   Past Medical History:  Diagnosis Date   Arthritis    Breast cancer ([place])    Diabetes mellitus without complication ([place])    Hypertension    Malignant neoplasm of 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 ([place]) 05/21/2011   Panic attacks    Personal history of chemotherapy    Personal history of radiation therapy    Sleep apnea     Family History  Problem Relation Age of Onset   Hypertension Mother    Heart disease Mother    Diabetes Mother    Breast cancer Cousin     Social History   Socioeconomic History   Marital status: Single    Spouse name: Not on file   Number of children: Not on file   Years of education: Not on file   Highest education level: Not on file  Occupational History   Not on file  Tobacco Use   Smoking status: Former    Years: 26.00    Types: Cigarettes   Smokeless tobacco: Never  Vaping Use   Vaping Use: Never used  Substance and Sexual Activity   Alcohol use: Yes    Comment: occasionally   Drug use: No   Sexual activity: Not on file  Other Topics Concern   Not on file  Social History Narrative   Not on file  Social Determinants of Health   Financial Resource Strain: Not on file  Food Insecurity: Not on file  Transportation Needs: Not on file  Physical Activity: Not on file  Stress: Not on file  Social Connections: Not on file  Intimate Partner Violence: Not on file    ROS  General:  Negative for nexplained weight loss, fever Skin: Negative for new or changing mole, sore that won't heal HEENT: Negative for trouble hearing, trouble seeing, ringing in ears, mouth sores, hoarseness, change in voice, dysphagia. CV:  Negative for chest pain, dyspnea,  edema, palpitations Resp: Negative for cough, dyspnea, hemoptysis GI: Negative for nausea, vomiting, diarrhea, constipation, abdominal pain, melena, hematochezia. GU: Negative for dysuria, incontinence, urinary hesitance, hematuria, vaginal or penile discharge, polyuria, sexual difficulty, lumps in testicle or breasts MSK: Negative for muscle cramps or aches, or swelling Neuro: Negative for headaches, weakness, numbness, dizziness, passing out/fainting Psych: Negative for depression, anxiety, memory problems  Objective  Physical Exam Vitals:   05/14/22 1025  BP: (!) 144/80  Pulse: 93  Temp: 98.7 F (37.1 C)  SpO2: 97%    BP Readings from Last 3 Encounters:  05/14/22 (!) 144/80  02/18/22 124/83  11/20/21 115/78   Wt Readings from Last 3 Encounters:  05/14/22 (!) 326 lb 6.4 oz (148.1 kg)  02/18/22 (!) 324 lb (147 kg)  11/20/21 (!) 311 lb (141.1 kg)    Physical Exam Constitutional:      General: She is not in acute distress.    Appearance: Normal appearance. She is obese.  HENT:     Head: Normocephalic.     Right Ear: Tympanic membrane normal.     Left Ear: Tympanic membrane normal.     Nose: Nose normal.     Mouth/Throat:     Mouth: Mucous membranes are moist.     Pharynx: Oropharynx is clear.  Eyes:     Conjunctiva/sclera: Conjunctivae normal.     Pupils: Pupils are equal, round, and reactive to light.  Cardiovascular:     Rate and Rhythm: Normal rate and regular rhythm.     Heart sounds: Normal heart sounds.  Pulmonary:     Effort: Pulmonary effort is normal.     Breath sounds: Normal breath sounds.  Abdominal:     General: Abdomen is flat. Bowel sounds are normal.     Palpations: Abdomen is soft. There is no mass.     Tenderness: There is no abdominal tenderness.  Musculoskeletal:        General: Normal range of motion.  Skin:    General: Skin is warm and dry.  Neurological:     Mental Status: She is alert.  Psychiatric:        Mood and Affect: Mood  normal.        Behavior: Behavior normal.    Assessment/Plan:   Type 2 diabetes mellitus without complication, without long-term current use of insulin (HCC) Assessment & Plan: Restart Ozempic. Will check A1c today. Continue Metformin.   Orders: -     Hemoglobin A1c -     Semaglutide(0.25 or 0.'5MG'$ /DOS); Inject 0.25 mg into the skin once a week. X 4 weeks. Then increase to 0.5 mg once weekly.  Dispense: 3 mL; Refill: 2  Essential (primary) hypertension Assessment & Plan: Chronic. Elevated reading today in office. Continue Norvasc, Labetalol, and HCTZ. Refills sent to pharmacy. Labs as outlined. Will monitor.  Orders: -     CBC with Differential/Platelet -     Comprehensive metabolic panel -  Labetalol HCl; Take 1 tablet (200 mg total) by mouth 2 (two) times daily.  Dispense: 180 tablet; Refill: 3  Mixed hyperlipidemia Assessment & Plan: Chronic. Will check lipids today. Continue Lipitor.   Orders: -     Lipid panel  Chronic pain of both knees Assessment & Plan: Will refer to pain management to take over medications. Continue follow up with Ortho.   Orders: -     Ambulatory referral to Pain Clinic  Anxiety Assessment & Plan: Stable on Xanax 0.5 PRN. Not taking often. Continue.   Orders: -     ALPRAZolam; Take 1 tablet (0.5 mg total) by mouth daily as needed for anxiety.  Dispense: 30 tablet; Refill: 2  Morbid obesity ([place]) Assessment & Plan: Continue working with weight management services. Encouraged healthy diet and exercise.   Orders: -     Hemoglobin A1c  B12 deficiency -     Vitamin B12  Vitamin D deficiency -     VITAMIN D 25 Hydroxy (Vit-D Deficiency, Fractures)  Thyroid disorder screen -     TSH  Screening mammogram for breast cancer -     3D Screening Mammogram, Left and Right; Future  Screening for cervical cancer -     Ambulatory referral to Obstetrics / Gynecology    Return in about 3 months (around 08/13/2022).   Tomasita Morrow,  NP-C [place]

## 2022-05-14 NOTE — Assessment & Plan Note (Signed)
Chronic. Will check lipids today. Continue Lipitor.

## 2022-05-14 NOTE — Assessment & Plan Note (Signed)
Restart Ozempic. Will check A1c today. Continue Metformin.

## 2022-05-15 ENCOUNTER — Telehealth: Payer: Self-pay

## 2022-05-15 ENCOUNTER — Other Ambulatory Visit: Payer: Self-pay

## 2022-05-15 MED ORDER — AMLODIPINE BESYLATE 10 MG PO TABS: 10.0000 mg | ORAL_TABLET | Freq: Every day | ORAL | 3 refills | Status: DC

## 2022-05-15 NOTE — Telephone Encounter (Signed)
LMOM for pt to CB in regards to labs 

## 2022-06-23 NOTE — Progress Notes (Unsigned)
  Tomasita Morrow, NP-C Phone: [phone]  Sherry Price is a 52 y.o. female who presents today for UTI symptoms.  UTI: Dysuria- *** Frequency- ***  Urgency- ***  Hematuria- ***  Fever- *** Abd pain- ***  Vaginal d/c- ***   Social History   Tobacco Use  Smoking Status Former   Years: 26.00   Types: Cigarettes  Smokeless Tobacco Never    Current Outpatient Medications on File Prior to Visit  Medication Sig Dispense Refill   ALPRAZolam (XANAX) 0.5 MG tablet Take 1 tablet (0.5 mg total) by mouth daily as needed for anxiety. 30 tablet 2   amLODipine (NORVASC) 10 MG tablet Take 1 tablet (10 mg total) by mouth daily. 90 tablet 3   Aspirin-Acetaminophen-Caffeine (GOODY HEADACHE PO) Take 1 packet by mouth as needed.     atorvastatin (LIPITOR) 40 MG tablet Take 40 mg by mouth daily.     calcium carbonate (TUMS) 500 MG chewable tablet Chew 1 tablet (200 mg of elemental calcium total) by mouth 2 (two) times daily. 20 tablet 0   hydrochlorothiazide (HYDRODIURIL) 25 MG tablet Take 12.5 mg by mouth daily.     ibuprofen (ADVIL,MOTRIN) 800 MG tablet Take 800 mg by mouth every 6 (six) hours as needed.     labetalol (NORMODYNE) 200 MG tablet Take 1 tablet (200 mg total) by mouth 2 (two) times daily. 180 tablet 3   metFORMIN (GLUCOPHAGE) 500 MG tablet Take 500 mg by mouth 2 (two) times daily with a meal.      ondansetron (ZOFRAN ODT) 4 MG disintegrating tablet Allow 1-2 tablets to dissolve in your mouth every 8 hours as needed for nausea/vomiting 30 tablet 0   Semaglutide,0.25 or 0.5MG/DOS, 2 MG/3ML SOPN Inject 0.25 mg into the skin once a week. X 4 weeks. Then increase to 0.5 mg once weekly. 3 mL 2   venlafaxine XR (EFFEXOR-XR) 150 MG 24 hr capsule Take 150 mg by mouth daily with breakfast.      XTAMPZA ER 9 MG C12A Take 1 capsule by mouth 2 (two) times daily.     No current facility-administered medications on file prior to visit.     ROS see history of present  illness  Objective  Physical Exam There were no vitals filed for this visit.  BP Readings from Last 3 Encounters:  05/14/22 (!) 144/80  02/18/22 124/83  11/20/21 115/78   Wt Readings from Last 3 Encounters:  05/14/22 (!) 326 lb 6.4 oz (148.1 kg)  02/18/22 (!) 324 lb (147 kg)  11/20/21 (!) 311 lb (141.1 kg)    Physical Exam   Assessment/Plan: Please see individual problem list.  There are no diagnoses linked to this encounter.   Health Maintenance: ***  No follow-ups on file.   Tomasita Morrow, NP-C [place]

## 2022-06-24 ENCOUNTER — Ambulatory Visit (INDEPENDENT_AMBULATORY_CARE_PROVIDER_SITE_OTHER): Payer: 59 | Admitting: Nurse Practitioner

## 2022-06-24 ENCOUNTER — Encounter: Payer: Self-pay | Admitting: Nurse Practitioner

## 2022-06-24 VITALS — BP 130/68 | HR 78 | Temp 98.1°F | Ht 65.0 in | Wt 330.0 lb

## 2022-06-24 DIAGNOSIS — E119 Type 2 diabetes mellitus without complications: Secondary | ICD-10-CM

## 2022-06-24 DIAGNOSIS — R319 Hematuria, unspecified: Secondary | ICD-10-CM

## 2022-06-24 DIAGNOSIS — R350 Frequency of micturition: Secondary | ICD-10-CM | POA: Diagnosis not present

## 2022-06-24 DIAGNOSIS — N390 Urinary tract infection, site not specified: Secondary | ICD-10-CM | POA: Diagnosis not present

## 2022-06-24 LAB — POC URINALSYSI DIPSTICK (AUTOMATED)
Bilirubin, UA: NEGATIVE
Glucose, UA: NEGATIVE
Ketones, UA: NEGATIVE
Nitrite, UA: NEGATIVE
Protein, UA: NEGATIVE
Spec Grav, UA: 1.025 (ref 1.010–1.025)
Urobilinogen, UA: 0.2 E.U./dL
pH, UA: 5.5 (ref 5.0–8.0)

## 2022-06-24 MED ORDER — SEMAGLUTIDE (1 MG/DOSE) 4 MG/3ML ~~LOC~~ SOPN: 1.0000 mg | PEN_INJECTOR | SUBCUTANEOUS | 0 refills | Status: DC

## 2022-06-24 MED ORDER — CIPROFLOXACIN HCL 500 MG PO TABS: 500.0000 mg | ORAL_TABLET | Freq: Two times a day (BID) | ORAL | 0 refills | Status: AC

## 2022-06-24 MED ORDER — SEMAGLUTIDE (2 MG/DOSE) 8 MG/3ML ~~LOC~~ SOPN: 2.0000 mg | PEN_INJECTOR | SUBCUTANEOUS | 1 refills | Status: DC

## 2022-06-24 NOTE — Patient Instructions (Addendum)
It was nice to see you again.   I have sent in antibiotics to your pharmacy. Please start taking them. Your urine culture is pending, I will contact you with the results and let you know if we need to change antibiotics.   Make sure you are staying adequately hydrated, decrease soda intake and increase water intake.

## 2022-06-24 NOTE — Assessment & Plan Note (Addendum)
Concern for pyelonephritis due to length of symptoms, CVA tenderness and hematuria. UA in office with blood and leukocytes. Will treat with Cipro 500 BID x 5 days. Microscopic UA and culture pending. Advised adequate hydration. She can continue Tylenol/Ibuprofen PRN. Counseled on return precautions.

## 2022-06-25 LAB — URINALYSIS, ROUTINE W REFLEX MICROSCOPIC
Bilirubin Urine: NEGATIVE
Hgb urine dipstick: NEGATIVE
Ketones, ur: NEGATIVE
Nitrite: NEGATIVE
RBC / HPF: NONE SEEN (ref 0–?)
Specific Gravity, Urine: >=1.03 — AB (ref 1.000–1.030)
Total Protein, Urine: NEGATIVE
Urine Glucose: NEGATIVE
Urobilinogen, UA: 0.2 (ref 0.0–1.0)
pH: 6 (ref 5.0–8.0)

## 2022-06-25 LAB — URINE CULTURE
MICRO NUMBER:: 14589485
SPECIMEN QUALITY:: ADEQUATE

## 2022-06-26 ENCOUNTER — Telehealth: Payer: Self-pay

## 2022-06-26 ENCOUNTER — Other Ambulatory Visit: Payer: Self-pay | Admitting: Nurse Practitioner

## 2022-06-26 DIAGNOSIS — N390 Urinary tract infection, site not specified: Secondary | ICD-10-CM

## 2022-06-26 DIAGNOSIS — R1030 Lower abdominal pain, unspecified: Secondary | ICD-10-CM

## 2022-06-26 NOTE — Telephone Encounter (Signed)
Called pt to see if she is agreeable to having a CT scan done of her abdomen and pelvis. LMOM for pt to CB

## 2022-06-26 NOTE — Telephone Encounter (Signed)
LMOM to CB in regards to results

## 2022-06-26 NOTE — Telephone Encounter (Signed)
Called pt to confirm appointment for tomorrow.  Left message for pt to call and confirm appointment by 1500 06/27/22

## 2022-06-30 ENCOUNTER — Encounter: Payer: 59 | Admitting: Obstetrics and Gynecology

## 2022-07-04 ENCOUNTER — Ambulatory Visit: Payer: 59

## 2022-07-10 ENCOUNTER — Ambulatory Visit: Admission: RE | Admit: 2022-07-10 | Payer: 59 | Source: Ambulatory Visit

## 2022-07-14 ENCOUNTER — Ambulatory Visit (HOSPITAL_BASED_OUTPATIENT_CLINIC_OR_DEPARTMENT_OTHER): Payer: Medicaid Other | Admitting: Pain Medicine

## 2022-07-14 DIAGNOSIS — M899 Disorder of bone, unspecified: Secondary | ICD-10-CM | POA: Insufficient documentation

## 2022-07-14 DIAGNOSIS — Z789 Other specified health status: Secondary | ICD-10-CM | POA: Insufficient documentation

## 2022-07-14 DIAGNOSIS — G894 Chronic pain syndrome: Secondary | ICD-10-CM | POA: Insufficient documentation

## 2022-07-14 DIAGNOSIS — Z79899 Other long term (current) drug therapy: Secondary | ICD-10-CM | POA: Insufficient documentation

## 2022-07-14 DIAGNOSIS — Z91199 Patient's noncompliance with other medical treatment and regimen due to unspecified reason: Secondary | ICD-10-CM

## 2022-07-14 NOTE — Progress Notes (Signed)
(07/14/2022) NO-SHOW to initial interventional pain management appointment.  Precharting review:   Historic Controlled Substance Pharmacotherapy Review  PMP and historical list of controlled substances: Alprazolam 0.5 mg tablet (#30), 1 tab p.o. daily (last filled on 06/20/2022); testosterone powder; Xtampza ER 9 mg capsule (#60), 1 cap p.o. twice daily (last filled on 03/20/2022); oxycodone/APAP 5/325 (# 60), 1 tab p.o. twice daily (last filled on 03/04/2022); Most recently prescribed opioid analgesics:   None MME/day: 0 mg/day  Historical Monitoring: The patient  reports no history of drug use. List of prior UDS Testing: No results found. Historical Background Evaluation: [place] PMP: PDMP reviewed for this encounter. Review of the past 33-month conducted.             PMP NARX Score Report:  Narcotic: 390 Sedative: 391 Stimulant: 000 Risk Assessment Profile: PMP NARX Overdose Risk Score: 300   Meds   Current Outpatient Medications:    ALPRAZolam (XANAX) 0.5 MG tablet, Take 1 tablet (0.5 mg total) by mouth daily as needed for anxiety., Disp: 30 tablet, Rfl: 2   amLODipine (NORVASC) 10 MG tablet, Take 1 tablet (10 mg total) by mouth daily., Disp: 90 tablet, Rfl: 3   Aspirin-Acetaminophen-Caffeine (GOODY HEADACHE PO), Take 1 packet by mouth as needed., Disp: , Rfl:    atorvastatin (LIPITOR) 40 MG tablet, Take 40 mg by mouth daily., Disp: , Rfl:    calcium carbonate (TUMS) 500 MG chewable tablet, Chew 1 tablet (200 mg of elemental calcium total) by mouth 2 (two) times daily., Disp: 20 tablet, Rfl: 0   hydrochlorothiazide (HYDRODIURIL) 25 MG tablet, Take 12.5 mg by mouth daily., Disp: , Rfl:    ibuprofen (ADVIL,MOTRIN) 800 MG tablet, Take 800 mg by mouth every 6 (six) hours as needed., Disp: , Rfl:    labetalol (NORMODYNE) 200 MG tablet, Take 1 tablet (200 mg total) by mouth 2 (two) times daily., Disp: 180 tablet, Rfl: 3   metFORMIN (GLUCOPHAGE) 500 MG tablet, Take 500 mg by mouth 2 (two)  times daily with a meal. , Disp: , Rfl:    ondansetron (ZOFRAN ODT) 4 MG disintegrating tablet, Allow 1-2 tablets to dissolve in your mouth every 8 hours as needed for nausea/vomiting, Disp: 30 tablet, Rfl: 0   Semaglutide, 1 MG/DOSE, 4 MG/3ML SOPN, Inject 1 mg into the skin once a week., Disp: 3 mL, Rfl: 0   [START ON 08/03/2022] Semaglutide, 2 MG/DOSE, 8 MG/3ML SOPN, Inject 2 mg as directed once a week., Disp: 3 mL, Rfl: 1   Semaglutide,0.25 or 0.'5MG'$ /DOS, 2 MG/3ML SOPN, Inject 0.25 mg into the skin once a week. X 4 weeks. Then increase to 0.5 mg once weekly., Disp: 3 mL, Rfl: 2   venlafaxine XR (EFFEXOR-XR) 150 MG 24 hr capsule, Take 150 mg by mouth daily with breakfast. , Disp: , Rfl:    XTAMPZA ER 9 MG C12A, Take 1 capsule by mouth 2 (two) times daily. (Patient not taking: Reported on 06/24/2022), Disp: , Rfl:   Imaging Review   Complexity Note: [facility].                          Allergies  Sherry Price is allergic to losartan.  Laboratory Chemistry Profile   Renal Lab Results  Component Value Date   BUN 11 05/14/2022   CREATININE 0.71 05/14/2022   GFR 97.05 05/14/2022   GFRAA >60 08/30/2019   GFRNONAA >60 02/18/2022  SPECGRAV 1.025 06/24/2022   PHUR 5.5 06/24/2022   PROTEINUR Negative 06/24/2022     Electrolytes Lab Results  Component Value Date   NA 144 05/14/2022   K 3.9 05/14/2022   CL 103 05/14/2022   CALCIUM 8.9 05/14/2022   MG 1.8 07/04/2013   PHOS 3.9 07/04/2013     Hepatic Lab Results  Component Value Date   AST 13 05/14/2022   ALT 16 05/14/2022   ALBUMIN 4.2 05/14/2022   ALKPHOS 107 05/14/2022   LIPASE 23 02/18/2022     ID Lab Results  Component Value Date   HIV Non Reactive 09/02/2016   PREGTESTUR NEGATIVE 02/18/2022     Bone Lab Results  Component Value Date   VD25OH 26.11 (L) 05/14/2022     Endocrine Lab Results  Component Value Date   GLUCOSE 148 (H) 05/14/2022   GLUCOSEU  NEGATIVE 06/24/2022   HGBA1C 7.0 (H) 05/14/2022   TSH 0.82 05/14/2022     Neuropathy Lab Results  Component Value Date   VITAMINB12 1,013 (H) 05/14/2022   HGBA1C 7.0 (H) 05/14/2022   HIV Non Reactive 09/02/2016     CNS No results found for: "COLORCSF", "APPEARCSF", "RBCCOUNTCSF", "WBCCSF", "POLYSCSF", "LYMPHSCSF", "EOSCSF", "PROTEINCSF", "GLUCCSF", "JCVIRUS", "CSFOLI", "IGGCSF", "LABACHR", "ACETBL"   Inflammation (CRP: Acute  ESR: Chronic) Lab Results  Component Value Date   ESRSEDRATE 8 10/28/2015     Rheumatology No results found for: "RF", "ANA", "LABURIC", "URICUR", "LYMEIGGIGMAB", "LYMEABIGMQN", "HLAB27"   Coagulation Lab Results  Component Value Date   INR 0.9 07/04/2013   LABPROT 12.1 07/04/2013   APTT 26.7 07/04/2013   PLT 404.0 (H) 05/14/2022   DDIMER <0.27 06/15/2019     Cardiovascular Lab Results  Component Value Date   CKTOTAL 58 07/17/2013   CKMB < 0.5 (L) 12/18/2012   TROPONINI <0.03 09/19/2016   HGB 13.3 05/14/2022   HCT 40.0 05/14/2022     Screening Lab Results  Component Value Date   HIV Non Reactive 09/02/2016   PREGTESTUR NEGATIVE 02/18/2022     Cancer Lab Results  Component Value Date   CEA 1.6 03/24/2011   LABCA2 17.2 06/04/2015     Allergens No results found for: "ALMOND", "APPLE", "ASPARAGUS", "AVOCADO", "BANANA", "BARLEY", "BASIL", "BAYLEAF", "GREENBEAN", "LIMABEAN", "WHITEBEAN", "BEEFIGE", "REDBEET", "BLUEBERRY", "BROCCOLI", "CABBAGE", "MELON", "CARROT", "CASEIN", "CASHEWNUT", "CAULIFLOWER", "CELERY"     Note: Lab results reviewed.   Note by: Gaspar Cola, MD (TTS technology used. I apologize for any typographical errors that were not detected and corrected.) Date: 07/14/2022; Time: 7:24 AM

## 2022-07-14 NOTE — Patient Instructions (Signed)
____________________________________________________________________________________________  New Patients  Welcome to [facility].   Initial Visit The first or initial visit consists of an evaluation only.   Interventional pain management.  We offer therapies other than opioid controlled substances to manage chronic pain. These include, but are not limited to, diagnostic, therapeutic, and palliative specialized injection therapies (i.e.: Epidural Steroids, Facet Blocks, etc.). We specialize in a variety of nerve blocks as well as radiofrequency treatments. We offer pain implant evaluations and trials, as well as follow up management. In addition we also provide a variety joint injections, including Viscosupplementation (AKA: Gel Therapy).  Prescription Pain Medication. We specialize in alternatives to opioids. We can provide evaluations and recommendations for/of pharmacologic therapies based on CDC Guidelines.  We no longer take patients for long-term medication management. We will not be taking over your pain medications.  ____________________________________________________________________________________________    ____________________________________________________________________________________________  Patient Information update  To: All of our patients.  Re: Name change.  It has been made official that our current name, "[facility]"   will soon be changed to "[facility]".   The purpose of this change is to eliminate any confusion created by the concept of our practice being a "Medication Management Pain Clinic". In the past this has led to the misconception that we treat pain primarily by the use of prescription medications.  Nothing can be farther from the truth.   Understanding PAIN MANAGEMENT: To  further understand what our practice does, you first have to understand that "Pain Management" is a subspecialty that requires additional training once a physician has completed their specialty training, which can be in either Anesthesia, Neurology, Psychiatry, or Physical Medicine and Rehabilitation (PMR). Each one of these contributes to the final approach taken by each physician to the management of their patient's pain. To be a "Pain Management Specialist" you must have first completed one of the specialty trainings below.  Anesthesiologists - trained in clinical pharmacology and interventional techniques such as nerve blockade and regional as well as central neuroanatomy. They are trained to block pain before, during, and after surgical interventions.  Neurologists - trained in the diagnosis and pharmacological treatment of complex neurological conditions, such as Multiple Sclerosis, Parkinson's, spinal cord injuries, and other systemic conditions that may be associated with symptoms that may include but are not limited to pain. They tend to rely primarily on the treatment of chronic pain using prescription medications.  Psychiatrist - trained in conditions affecting the psychosocial wellbeing of patients including but not limited to depression, anxiety, schizophrenia, personality disorders, addiction, and other substance use disorders that may be associated with chronic pain. They tend to rely primarily on the treatment of chronic pain using prescription medications.   Physical Medicine and Rehabilitation (PMR) physicians, also known as physiatrists - trained to treat a wide variety of medical conditions affecting the brain, spinal cord, nerves, bones, joints, ligaments, muscles, and tendons. Their training is primarily aimed at treating patients that have suffered injuries that have caused severe physical impairment. Their training is primarily aimed at the physical therapy and rehabilitation of those  patients. They may also work alongside orthopedic surgeons or neurosurgeons using their expertise in assisting surgical patients to recover after their surgeries.  INTERVENTIONAL PAIN MANAGEMENT is sub-subspecialty of Pain Management.  Our physicians are Board-certified in Anesthesia, Pain Management, and Interventional Pain Management.  This meaning that not only have they been trained   and Board-certified in their specialty of Anesthesia, and subspecialty of Pain Management, but they have also received further training in the sub-subspecialty of Interventional Pain Management, in order to become Board-certified as INTERVENTIONAL PAIN MANAGEMENT SPECIALIST.    Mission: Our goal is to use our skills in  INTERVENTIONAL PAIN MANAGEMENT as alternatives to the chronic use of prescription opioid medications for the treatment of pain. To make this more clear, we have changed our name to reflect what we do and offer. We will continue to offer medication management assessment and recommendations, but we will not be taking over any patient's medication management.  ____________________________________________________________________________________________     

## 2022-07-29 NOTE — Progress Notes (Unsigned)
Patient: Sherry Price  Service Category: E/M  Provider: Gaspar Cola, MD  DOB: 1970/12/31  DOS: 07/30/2022  Referring Provider: Tomasita Morrow, NP  MRN: [id number]  Setting: Ambulatory outpatient  PCP: Sherry Morrow, NP  Type: New Patient  Specialty: Interventional Pain Management    Location: Office  Delivery: Face-to-face     Primary Reason(s) for Visit: Encounter for initial evaluation of one or more chronic problems (new to examiner) potentially causing chronic pain, and posing a threat to normal musculoskeletal function. (Level of risk: High) CC: No chief complaint on file.  HPI  [name] is a 52 y.o. year old, female patient, who comes for the first time to our practice referred by Sherry Morrow, NP for our initial evaluation of her chronic pain. She has Breast cancer of upper-outer quadrant of left female breast ([place]); Anxiety; Essential (primary) hypertension; Mixed hyperlipidemia; Heartburn; Gastritis; Change in bowel habits; Benign neoplasm of ascending colon; Bipolar affective disorder ([place]); Malignant neoplasm of breast ([place]); Morbid obesity ([place]); Type 2 diabetes mellitus ([place]); Vitamin D deficiency; B12 deficiency; Chronic pain of both knees; Urinary tract infection with hematuria; Chronic pain syndrome; Pharmacologic therapy; Disorder of skeletal system; Problems influencing health status; and Failure to attend appointment on their problem list. Today she comes in for evaluation of her No chief complaint on file.  Pain Assessment: Location:     Radiating:   Onset:   Duration:   Quality:   Severity:  /10 (subjective, self-reported pain score)  Effect on ADL:   Timing:   Modifying factors:   BP:    HR:    Onset and Duration: {Hx; Onset and Duration:210120511} Cause of pain: {Hx; Cause:210120521} Severity: {Pain Severity:210120502} Timing: {Symptoms; Timing:210120501} Aggravating Factors: {Causes; Aggravating pain factors:210120507} Alleviating Factors: {Causes;  Alleviating Factors:210120500} Associated Problems: {Hx; Associated problems:210120515} Quality of Pain: {Hx; Symptom quality or Descriptor:210120531} Previous Examinations or Tests: {Hx; Previous examinations or test:210120529} Previous Treatments: {Hx; Previous Treatment:210120503}  [name] is being evaluated for possible interventional pain management therapies for the treatment of her chronic pain.   ***  [name] has been informed that this initial visit was an evaluation only.  On the follow up appointment I will go over the results, including ordered tests and available interventional therapies. At that time she will have the opportunity to decide whether to proceed with offered therapies or not. In the event that [name] prefers avoiding interventional options, this will conclude our involvement in the case.  Medication management recommendations may be provided upon request.  Historic Controlled Substance Pharmacotherapy Review  PMP and historical list of controlled substances: ***  Most recently prescribed opioid analgesics:   *** MME/day: *** mg/day  Historical Monitoring: The patient  reports no history of drug use. List of prior UDS Testing: No results found for: "MDMA", "COCAINSCRNUR", "PCPSCRNUR", "PCPQUANT", "CANNABQUANT", "THCU", "ETH", "CBDTHCR", "D8THCCBX", "D9THCCBX" Historical Background Evaluation: [place] PMP: PDMP reviewed during this encounter. Review of the past 33-months conducted.             PMP NARX Score Report:  Narcotic: *** Sedative: *** Stimulant: *** [place] Department of public safety, offender search: Editor, commissioning Information) Non-contributory Risk Assessment Profile: Aberrant behavior: None observed or detected today Risk factors for fatal opioid overdose: None identified today PMP NARX Overdose Risk Score: *** Fatal overdose hazard ratio (HR): Calculation deferred Non-fatal overdose hazard ratio (HR): Calculation deferred Risk of opioid abuse or  dependence: 0.7-3.0% with doses ? 36 MME/day and 6.1-26% with doses ? 120 MME/day.  Substance use disorder (SUD) risk level: See below Personal History of Substance Abuse (SUD-Substance use disorder):  Alcohol:    Illegal Drugs:    Rx Drugs:    ORT Risk Level calculation:    ORT Scoring interpretation table:  Score <3 = Low Risk for SUD  Score between 4-7 = Moderate Risk for SUD  Score >8 = High Risk for Opioid Abuse   PHQ-2 Depression Scale:  Total score:    PHQ-2 Scoring interpretation table: (Score and probability of major depressive disorder)  Score 0 = No depression  Score 1 = 15.4% Probability  Score 2 = 21.1% Probability  Score 3 = 38.4% Probability  Score 4 = 45.5% Probability  Score 5 = 56.4% Probability  Score 6 = 78.6% Probability   PHQ-9 Depression Scale:  Total score:    PHQ-9 Scoring interpretation table:  Score 0-4 = No depression  Score 5-9 = Mild depression  Score 10-14 = Moderate depression  Score 15-19 = Moderately severe depression  Score 20-27 = Severe depression (2.4 times higher risk of SUD and 2.89 times higher risk of overuse)   Pharmacologic Plan: As per protocol, I have not taken over any controlled substance management, pending the results of ordered tests and/or consults.            Initial impression: Pending review of available data and ordered tests.  Meds   Current Outpatient Medications:    ALPRAZolam (XANAX) 0.5 MG tablet, Take 1 tablet (0.5 mg total) by mouth daily as needed for anxiety., Disp: 30 tablet, Rfl: 2   amLODipine (NORVASC) 10 MG tablet, Take 1 tablet (10 mg total) by mouth daily., Disp: 90 tablet, Rfl: 3   Aspirin-Acetaminophen-Caffeine (GOODY HEADACHE PO), Take 1 packet by mouth as needed., Disp: , Rfl:    atorvastatin (LIPITOR) 40 MG tablet, Take 40 mg by mouth daily., Disp: , Rfl:    calcium carbonate (TUMS) 500 MG chewable tablet, Chew 1 tablet (200 mg of elemental calcium total) by mouth 2 (two) times daily., Disp: 20  tablet, Rfl: 0   hydrochlorothiazide (HYDRODIURIL) 25 MG tablet, Take 12.5 mg by mouth daily., Disp: , Rfl:    ibuprofen (ADVIL,MOTRIN) 800 MG tablet, Take 800 mg by mouth every 6 (six) hours as needed., Disp: , Rfl:    labetalol (NORMODYNE) 200 MG tablet, Take 1 tablet (200 mg total) by mouth 2 (two) times daily., Disp: 180 tablet, Rfl: 3   metFORMIN (GLUCOPHAGE) 500 MG tablet, Take 500 mg by mouth 2 (two) times daily with a meal. , Disp: , Rfl:    ondansetron (ZOFRAN ODT) 4 MG disintegrating tablet, Allow 1-2 tablets to dissolve in your mouth every 8 hours as needed for nausea/vomiting, Disp: 30 tablet, Rfl: 0   Semaglutide, 1 MG/DOSE, 4 MG/3ML SOPN, Inject 1 mg into the skin once a week., Disp: 3 mL, Rfl: 0   [START ON 08/03/2022] Semaglutide, 2 MG/DOSE, 8 MG/3ML SOPN, Inject 2 mg as directed once a week., Disp: 3 mL, Rfl: 1   Semaglutide,0.25 or 0.5MG /DOS, 2 MG/3ML SOPN, Inject 0.25 mg into the skin once a week. X 4 weeks. Then increase to 0.5 mg once weekly., Disp: 3 mL, Rfl: 2   venlafaxine XR (EFFEXOR-XR) 150 MG 24 hr capsule, Take 150 mg by mouth daily with breakfast. , Disp: , Rfl:    XTAMPZA ER 9 MG C12A, Take 1 capsule by mouth 2 (two) times daily. (Patient not taking: Reported on 06/24/2022), Disp: , Rfl:   Imaging Review  Cervical Imaging: Cervical MR wo contrast: No results found for this or any previous visit.  Cervical MR wo contrast: No valid procedures specified. Cervical MR w/wo contrast: No results found for this or any previous visit.  Cervical MR w contrast: No results found for this or any previous visit.  Cervical CT wo contrast: No results found for this or any previous visit.  Cervical CT w/wo contrast: No results found for this or any previous visit.  Cervical CT w/wo contrast: No results found for this or any previous visit.  Cervical CT w contrast: No results found for this or any previous visit.  Cervical CT outside: No results found for this or any previous  visit.  Cervical DG 1 view: No results found for this or any previous visit.  Cervical DG 2-3 views: No results found for this or any previous visit.  Cervical DG F/E views: No results found for this or any previous visit.  Cervical DG 2-3 clearing views: No results found for this or any previous visit.  Cervical DG Bending/F/E views: No results found for this or any previous visit.  Cervical DG complete: No results found for this or any previous visit.  Cervical DG Myelogram views: No results found for this or any previous visit.  Cervical DG Myelogram views: No results found for this or any previous visit.  Cervical Discogram views: No results found for this or any previous visit.   Shoulder Imaging: Shoulder-R MR w contrast: No results found for this or any previous visit.  Shoulder-L MR w contrast: No results found for this or any previous visit.  Shoulder-R MR w/wo contrast: No results found for this or any previous visit.  Shoulder-L MR w/wo contrast: No results found for this or any previous visit.  Shoulder-R MR wo contrast: No results found for this or any previous visit.  Shoulder-L MR wo contrast: No results found for this or any previous visit.  Shoulder-R CT w contrast: No results found for this or any previous visit.  Shoulder-L CT w contrast: No results found for this or any previous visit.  Shoulder-R CT w/wo contrast: No results found for this or any previous visit.  Shoulder-L CT w/wo contrast: No results found for this or any previous visit.  Shoulder-R CT wo contrast: No results found for this or any previous visit.  Shoulder-L CT wo contrast: No results found for this or any previous visit.  Shoulder-R DG Arthrogram: No results found for this or any previous visit.  Shoulder-L DG Arthrogram: No results found for this or any previous visit.  Shoulder-R DG 1 view: No results found for this or any previous visit.  Shoulder-L DG 1 view: No results  found for this or any previous visit.  Shoulder-R DG: No results found for this or any previous visit.  Shoulder-L DG: No results found for this or any previous visit.   Thoracic Imaging: Thoracic MR wo contrast: No results found for this or any previous visit.  Thoracic MR wo contrast: No valid procedures specified. Thoracic MR w/wo contrast: No results found for this or any previous visit.  Thoracic MR w contrast: No results found for this or any previous visit.  Thoracic CT wo contrast: No results found for this or any previous visit.  Thoracic CT w/wo contrast: No results found for this or any previous visit.  Thoracic CT w/wo contrast: No results found for this or any previous visit.  Thoracic CT w contrast: No results found for  this or any previous visit.  Thoracic DG 2-3 views: No results found for this or any previous visit.  Thoracic DG 4 views: No results found for this or any previous visit.  Thoracic DG: No results found for this or any previous visit.  Thoracic DG w/swimmers view: No results found for this or any previous visit.  Thoracic DG Myelogram views: No results found for this or any previous visit.  Thoracic DG Myelogram views: No results found for this or any previous visit.   Lumbosacral Imaging: Lumbar MR wo contrast: No results found for this or any previous visit.  Lumbar MR wo contrast: No valid procedures specified. Lumbar MR w/wo contrast: No results found for this or any previous visit.  Lumbar MR w/wo contrast: No results found for this or any previous visit.  Lumbar MR w contrast: No results found for this or any previous visit.  Lumbar CT wo contrast: No results found for this or any previous visit.  Lumbar CT w/wo contrast: No results found for this or any previous visit.  Lumbar CT w/wo contrast: No results found for this or any previous visit.  Lumbar CT w contrast: No results found for this or any previous visit.  Lumbar DG  1V: No results found for this or any previous visit.  Lumbar DG 1V (Clearing): No results found for this or any previous visit.  Lumbar DG 2-3V (Clearing): No results found for this or any previous visit.  Lumbar DG 2-3 views: No results found for this or any previous visit.  Lumbar DG (Complete) 4+V: No results found for this or any previous visit.        Lumbar DG F/E views: No results found for this or any previous visit.        Lumbar DG Bending views: No results found for this or any previous visit.        Lumbar DG Myelogram views: No results found for this or any previous visit.  Lumbar DG Myelogram: No results found for this or any previous visit.  Lumbar DG Myelogram: No results found for this or any previous visit.  Lumbar DG Myelogram: No results found for this or any previous visit.  Lumbar DG Myelogram Lumbosacral: No results found for this or any previous visit.  Lumbar DG Diskogram views: No results found for this or any previous visit.  Lumbar DG Diskogram views: No results found for this or any previous visit.  Lumbar DG Epidurogram OP: No results found for this or any previous visit.  Lumbar DG Epidurogram IP: No valid procedures specified.  Sacroiliac Joint Imaging: Sacroiliac Joint DG: No results found for this or any previous visit.  Sacroiliac Joint MR w/wo contrast: No results found for this or any previous visit.  Sacroiliac Joint MR wo contrast: No results found for this or any previous visit.   Spine Imaging: Whole Spine DG Myelogram views: No results found for this or any previous visit.  Whole Spine MR Mets screen: No results found for this or any previous visit.  Whole Spine MR Mets screen: No results found for this or any previous visit.  Whole Spine MR w/wo: No results found for this or any previous visit.  MRA Spinal Canal w/ cm: No results found for this or any previous visit.  MRA Spinal Canal wo/ cm: No valid procedures  specified. MRA Spinal Canal w/wo cm: No results found for this or any previous visit.  Spine Outside MR Films: No results found  for this or any previous visit.  Spine Outside CT Films: No results found for this or any previous visit.  CT-Guided Biopsy: No results found for this or any previous visit.  CT-Guided Needle Placement: No results found for this or any previous visit.  DG Spine outside: No results found for this or any previous visit.  IR Spine outside: No results found for this or any previous visit.  NM Spine outside: No results found for this or any previous visit.   Hip Imaging: Hip-R MR w contrast: No results found for this or any previous visit.  Hip-L MR w contrast: No results found for this or any previous visit.  Hip-R MR w/wo contrast: No results found for this or any previous visit.  Hip-L MR w/wo contrast: No results found for this or any previous visit.  Hip-R MR wo contrast: No results found for this or any previous visit.  Hip-L MR wo contrast: No results found for this or any previous visit.  Hip-R CT w contrast: No results found for this or any previous visit.  Hip-L CT w contrast: No results found for this or any previous visit.  Hip-R CT w/wo contrast: No results found for this or any previous visit.  Hip-L CT w/wo contrast: No results found for this or any previous visit.  Hip-R CT wo contrast: No results found for this or any previous visit.  Hip-L CT wo contrast: No results found for this or any previous visit.  Hip-R DG 2-3 views: No results found for this or any previous visit.  Hip-L DG 2-3 views: No results found for this or any previous visit.  Hip-R DG Arthrogram: No results found for this or any previous visit.  Hip-L DG Arthrogram: No results found for this or any previous visit.  Hip-B DG Bilateral: No results found for this or any previous visit.   Knee Imaging: Knee-R MR w contrast: No results found for this or any  previous visit.  Knee-L MR w/o contrast: No results found for this or any previous visit.  Knee-R MR w/wo contrast: No results found for this or any previous visit.  Knee-L MR w/wo contrast: No results found for this or any previous visit.  Knee-R MR wo contrast: No results found for this or any previous visit.  Knee-L MR wo contrast: No results found for this or any previous visit.  Knee-R CT w contrast: No results found for this or any previous visit.  Knee-L CT w contrast: No results found for this or any previous visit.  Knee-R CT w/wo contrast: No results found for this or any previous visit.  Knee-L CT w/wo contrast: No results found for this or any previous visit.  Knee-R CT wo contrast: No results found for this or any previous visit.  Knee-L CT wo contrast: No results found for this or any previous visit.  Knee-R DG 1-2 views: No results found for this or any previous visit.  Knee-L DG 1-2 views: No results found for this or any previous visit.  Knee-R DG 3 views: No results found for this or any previous visit.  Knee-L DG 3 views: No results found for this or any previous visit.  Knee-R DG 4 views: No results found for this or any previous visit.  Knee-L DG 4 views: No results found for this or any previous visit.  Knee-R DG Arthrogram: No results found for this or any previous visit.  Knee-L DG Arthrogram: No results found for this  or any previous visit.   Ankle Imaging: Ankle-R DG Complete: No results found for this or any previous visit.  Ankle-L DG Complete: No results found for this or any previous visit.   Foot Imaging: Foot-R DG Complete: No results found for this or any previous visit.  Foot-L DG Complete: No results found for this or any previous visit.   Elbow Imaging: Elbow-R DG Complete: No results found for this or any previous visit.  Elbow-L DG Complete: No results found for this or any previous visit.   Wrist Imaging: Wrist-R DG  Complete: No results found for this or any previous visit.  Wrist-L DG Complete: No results found for this or any previous visit.   Hand Imaging: Hand-R DG Complete: No results found for this or any previous visit.  Hand-L DG Complete: No results found for this or any previous visit.   Complexity Note: Imaging results reviewed.                         ROS  Cardiovascular: {Hx; Cardiovascular History:210120525} Pulmonary or Respiratory: {Hx; Pumonary and/or Respiratory History:210120523} Neurological: {Hx; Neurological:210120504} Psychological-Psychiatric: {Hx; Psychological-Psychiatric History:210120512} Gastrointestinal: {Hx; Gastrointestinal:210120527} Genitourinary: {Hx; Genitourinary:210120506} Hematological: {Hx; Hematological:210120510} Endocrine: {Hx; Endocrine history:210120509} Rheumatologic: {Hx; Rheumatological:210120530} Musculoskeletal: {Hx; Musculoskeletal:210120528} Work History: {Hx; Work history:210120514}  Allergies  Ms. Vandergriff is allergic to losartan.  Laboratory Chemistry Profile   Renal Lab Results  Component Value Date   BUN 11 05/14/2022   CREATININE 0.71 05/14/2022   GFR 97.05 05/14/2022   GFRAA >60 08/30/2019   GFRNONAA >60 02/18/2022   SPECGRAV 1.025 06/24/2022   PHUR 5.5 06/24/2022   PROTEINUR Negative 06/24/2022     Electrolytes Lab Results  Component Value Date   NA 144 05/14/2022   K 3.9 05/14/2022   CL 103 05/14/2022   CALCIUM 8.9 05/14/2022   MG 1.8 07/04/2013   PHOS 3.9 07/04/2013     Hepatic Lab Results  Component Value Date   AST 13 05/14/2022   ALT 16 05/14/2022   ALBUMIN 4.2 05/14/2022   ALKPHOS 107 05/14/2022   LIPASE 23 02/18/2022     ID Lab Results  Component Value Date   HIV Non Reactive 09/02/2016   PREGTESTUR NEGATIVE 02/18/2022     Bone Lab Results  Component Value Date   VD25OH 26.11 (L) 05/14/2022     Endocrine Lab Results  Component Value Date   GLUCOSE 148 (H) 05/14/2022   GLUCOSEU  NEGATIVE 06/24/2022   HGBA1C 7.0 (H) 05/14/2022   TSH 0.82 05/14/2022     Neuropathy Lab Results  Component Value Date   VITAMINB12 1,013 (H) 05/14/2022   HGBA1C 7.0 (H) 05/14/2022   HIV Non Reactive 09/02/2016     CNS No results found for: "COLORCSF", "APPEARCSF", "RBCCOUNTCSF", "WBCCSF", "POLYSCSF", "LYMPHSCSF", "EOSCSF", "PROTEINCSF", "GLUCCSF", "JCVIRUS", "CSFOLI", "IGGCSF", "LABACHR", "ACETBL"   Inflammation (CRP: Acute  ESR: Chronic) Lab Results  Component Value Date   ESRSEDRATE 8 10/28/2015     Rheumatology No results found for: "RF", "ANA", "LABURIC", "URICUR", "LYMEIGGIGMAB", "LYMEABIGMQN", "HLAB27"   Coagulation Lab Results  Component Value Date   INR 0.9 07/04/2013   LABPROT 12.1 07/04/2013   APTT 26.7 07/04/2013   PLT 404.0 (H) 05/14/2022   DDIMER <0.27 06/15/2019     Cardiovascular Lab Results  Component Value Date   CKTOTAL 58 07/17/2013   CKMB < 0.5 (L) 12/18/2012   TROPONINI <0.03 09/19/2016   HGB 13.3 05/14/2022   HCT 40.0 05/14/2022  Screening Lab Results  Component Value Date   HIV Non Reactive 09/02/2016   PREGTESTUR NEGATIVE 02/18/2022     Cancer Lab Results  Component Value Date   CEA 1.6 03/24/2011   LABCA2 17.2 06/04/2015     Allergens No results found for: "ALMOND", "APPLE", "ASPARAGUS", "AVOCADO", "BANANA", "BARLEY", "BASIL", "BAYLEAF", "GREENBEAN", "LIMABEAN", "WHITEBEAN", "BEEFIGE", "REDBEET", "BLUEBERRY", "BROCCOLI", "CABBAGE", "MELON", "CARROT", "CASEIN", "CASHEWNUT", "CAULIFLOWER", "CELERY"     Note: Lab results reviewed.  PFSH  Drug: Ms. Ericson  reports no history of drug use. Alcohol:  reports current alcohol use. Tobacco:  reports that she has quit smoking. Her smoking use included cigarettes. She has never used smokeless tobacco. Medical:  has a past medical history of Arthritis, Breast cancer ([place]), Diabetes mellitus without complication ([place]), Hypertension, Malignant neoplasm of upper-outer quadrant of female  breast ([place]) (05/21/2011), Panic attacks, Personal history of chemotherapy, Personal history of radiation therapy, and Sleep apnea. Family: family history includes Breast cancer in her cousin; Diabetes in her mother; Heart disease in her mother; Hypertension in her mother.  Past Surgical History:  Procedure Laterality Date   BREAST BIOPSY Left 2013   +   BREAST BIOPSY Right    neg   BREAST LUMPECTOMY  2013   BREAST MAMMOSITE Left February 2013   BREAST REDUCTION SURGERY  2005   COLONOSCOPY WITH PROPOFOL N/A 01/09/2015   Procedure: COLONOSCOPY WITH PROPOFOL;  Surgeon: Lucilla Lame, MD;  Location: [facility] ENDOSCOPY;  Service: Endoscopy;  Laterality: N/A;   ESOPHAGOGASTRODUODENOSCOPY (EGD) WITH PROPOFOL N/A 01/09/2015   Procedure: ESOPHAGOGASTRODUODENOSCOPY (EGD) WITH PROPOFOL;  Surgeon: Lucilla Lame, MD;  Location: [facility] ENDOSCOPY;  Service: Endoscopy;  Laterality: N/A;   OOPHORECTOMY Right 2013   REDUCTION MAMMAPLASTY     Active Ambulatory Problems    Diagnosis Date Noted   Breast cancer of upper-outer quadrant of left female breast ([place]) 05/21/2011   Anxiety 10/26/2013   Essential (primary) hypertension 10/26/2013   Mixed hyperlipidemia 10/26/2013   Heartburn    Gastritis    Change in bowel habits    Benign neoplasm of ascending colon    Bipolar affective disorder ([place]) 10/26/2013   Malignant neoplasm of breast ([place]) 10/26/2013   Morbid obesity ([place]) 10/26/2013   Type 2 diabetes mellitus ([place]) 10/26/2013   Vitamin D deficiency 05/14/2022   B12 deficiency 05/14/2022   Chronic pain of both knees 05/14/2022   Urinary tract infection with hematuria 06/24/2022   Chronic pain syndrome 07/14/2022   Pharmacologic therapy 07/14/2022   Disorder of skeletal system 07/14/2022   Problems influencing health status 07/14/2022   Failure to attend appointment 07/14/2022   Resolved Ambulatory Problems    Diagnosis Date Noted   Arthritis of knee, degenerative 10/11/2013   Cough with expectoration  02/27/2015   Past Medical History:  Diagnosis Date   Arthritis    Breast cancer ([place])    Diabetes mellitus without complication ([place])    Hypertension    Malignant neoplasm of 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 ([place]) 05/21/2011   Panic attacks    Personal history of chemotherapy    Personal history of radiation therapy    Sleep apnea    Constitutional Exam  General appearance: Well nourished, well developed, and well hydrated. In no apparent acute distress There were no vitals filed for this visit. BMI Assessment: Estimated body mass index is 54.91 kg/m as calculated from the following:   Height as of 06/24/22: 5\' 5"  (1.651 m).   Weight as of 06/24/22: 330 lb (149.7 kg).  BMI interpretation table: BMI level Category Range association with higher incidence of chronic pain  <18 kg/m2 Underweight   18.5-24.9 kg/m2 Ideal body weight   25-29.9 kg/m2 Overweight Increased incidence by 20%  30-34.9 kg/m2 Obese (Class I) Increased incidence by 68%  35-39.9 kg/m2 Severe obesity (Class II) Increased incidence by 136%  >40 kg/m2 Extreme obesity (Class III) Increased incidence by 254%   Patient's current BMI Ideal Body weight  There is no height or weight on file to calculate BMI. Patient weight not recorded   BMI Readings from Last 4 Encounters:  06/24/22 54.91 kg/m  05/14/22 54.32 kg/m  02/18/22 50.75 kg/m  11/20/21 48.71 kg/m   Wt Readings from Last 4 Encounters:  06/24/22 (!) 330 lb (149.7 kg)  05/14/22 (!) 326 lb 6.4 oz (148.1 kg)  02/18/22 (!) 324 lb (147 kg)  11/20/21 (!) 311 lb (141.1 kg)    Psych/Mental status: Alert, oriented x 3 (person, place, & time)       Eyes: PERLA Respiratory: No evidence of acute respiratory distress  Assessment  Primary Diagnosis & Pertinent Problem List: There were no encounter diagnoses.  Visit Diagnosis (New problems to examiner): No diagnosis found. Plan of Care (Initial workup plan)  Note: Ms. Shawley was reminded that as per  protocol, today's visit has been an evaluation only. We have not taken over the patient's controlled substance management.  Problem-specific plan: No problem-specific Assessment & Plan notes found for this encounter.  Lab Orders  No laboratory test(s) ordered today   Imaging Orders  No imaging studies ordered today   Referral Orders  No referral(s) requested today   Procedure Orders    No procedure(s) ordered today   Pharmacotherapy (current): Medications ordered:  No orders of the defined types were placed in this encounter.  Medications administered during this visit: Evyenia Guzy. Vinje had no medications administered during this visit.   Analgesic Pharmacotherapy:  Opioid Analgesics: For patients currently taking or requesting to take opioid analgesics, in accordance with [place], we will assess their risks and indications for the use of these substances. After completing our evaluation, we may offer recommendations, but we no longer take patients for medication management. The prescribing physician will ultimately decide, based on his/her training and level of comfort whether to adopt any of the recommendations, including whether or not to prescribe such medicines.  Membrane stabilizer: To be determined at a later time  Muscle relaxant: To be determined at a later time  NSAID: To be determined at a later time  Other analgesic(s): To be determined at a later time   Interventional management options: Ms. Bauser was informed that there is no guarantee that she would be a candidate for interventional therapies. The decision will be based on the results of diagnostic studies, as well as Ms. Schriver's risk profile.  Procedure(s) under consideration:  Pending results of ordered studies      Interventional Therapies  Risk Factors  Considerations:     Planned  Pending:   See above for possible orders   Under consideration:   Pending completion  of evaluation   Completed:   None at this time   Completed by other providers:   None reported   Therapeutic  Palliative (PRN) options:   None established      Provider-requested follow-up: No follow-ups on file.  Future Appointments  Date Time Provider [place]  07/30/2022  1:00 PM Milinda Pointer, MD [facility] None  08/13/2022  8:40 AM  Sherry Morrow, NP [facility]    Duration of encounter: *** minutes.  Total time on encounter, as per AMA guidelines included both the face-to-face and non-face-to-face time personally spent by the physician and/or other qualified health care professional(s) on the day of the encounter (includes time in activities that require the physician or other qualified health care professional and does not include time in activities normally performed by clinical staff). Physician's time may include the following activities when performed: Preparing to see the patient (e.g., pre-charting review of records, searching for previously ordered imaging, lab work, and nerve conduction tests) Review of prior analgesic pharmacotherapies. Reviewing PMP Interpreting ordered tests (e.g., lab work, imaging, nerve conduction tests) Performing post-procedure evaluations, including interpretation of diagnostic procedures Obtaining and/or reviewing separately obtained history Performing a medically appropriate examination and/or evaluation Counseling and educating the patient/family/caregiver Ordering medications, tests, or procedures Referring and communicating with other health care professionals (when not separately reported) Documenting clinical information in the electronic or other health record Independently interpreting results (not separately reported) and communicating results to the patient/ family/caregiver Care coordination (not separately reported)  Note by: Sherry Cola, MD (TTS technology used. I apologize for any typographical errors  that were not detected and corrected.) Date: 07/30/2022; Time: 8:04 AM

## 2022-07-30 ENCOUNTER — Ambulatory Visit (HOSPITAL_BASED_OUTPATIENT_CLINIC_OR_DEPARTMENT_OTHER): Payer: Medicaid Other | Admitting: Pain Medicine

## 2022-07-30 DIAGNOSIS — Z91199 Patient's noncompliance with other medical treatment and regimen due to unspecified reason: Secondary | ICD-10-CM

## 2022-07-30 DIAGNOSIS — F32A Depression, unspecified: Secondary | ICD-10-CM | POA: Insufficient documentation

## 2022-07-30 DIAGNOSIS — E66813 Obesity, class 3: Secondary | ICD-10-CM | POA: Insufficient documentation

## 2022-07-30 DIAGNOSIS — Z6841 Body Mass Index (BMI) 40.0 and over, adult: Secondary | ICD-10-CM | POA: Insufficient documentation

## 2022-07-30 NOTE — Patient Instructions (Signed)
____________________________________________________________________________________________  New Patients  Welcome to [facility] [place].   Initial Visit The first or initial visit consists of an evaluation only.   Interventional pain management.  We offer therapies other than opioid controlled substances to manage chronic pain. These include, but are not limited to, diagnostic, therapeutic, and palliative specialized injection therapies (i.e.: Epidural Steroids, Facet Blocks, etc.). We specialize in a variety of nerve blocks as well as radiofrequency treatments. We offer pain implant evaluations and trials, as well as follow up management. In addition we also provide a variety joint injections, including Viscosupplementation (AKA: Gel Therapy).  Prescription Pain Medication. We specialize in alternatives to opioids. We can provide evaluations and recommendations for/of pharmacologic therapies based on CDC Guidelines.  We no longer take patients for long-term medication management. We will not be taking over your pain medications.  ____________________________________________________________________________________________    _________________________________________________________________________________________  Body mass index (BMI) Weight Management Required  URGENT: Dear Sherry Price your weight has been found to be adversely affecting your health. Aggressive action is immediately required. Talk to your primary care physician.  Body mass index (BMI) is a common tool for deciding whether a person has an appropriate body weight.  It measures a persons weight in relation to their height.   According to the [employer] of health (NIH): A BMI of less than 18.5 means that a person is underweight. A BMI of between 18.5 and 24.9 is ideal. A BMI of between 25 and 29.9 is overweight. A BMI over 30 indicates obesity.  Body  Mass Index (BMI) Classification BMI level (kg/m2) Category Associated incidence of chronic pain  <18  Underweight   18.5-24.9 Ideal body weight   25-29.9 Overweight  20%  30-34.9 Obese (Class I)  68%  35-39.9 Severe obesity (Class II)  136%  >40 Extreme obesity (Class III)  254%   Your current Estimated body mass index is 54.91 kg/m as calculated from the following:   Height as of 06/24/22: 5\' 5"  (1.651 m).   Weight as of 06/24/22: 330 lb (149.7 kg).  Morbidly Obese Classification: You will be considered to be "Morbidly Obese" if your BMI is above 30 and you have one or more of the following conditions caused or associated to obesity: 1.    Type 2 Diabetes (Leading to cardiovascular diseases (CVD), stroke, peripheral vascular diseases (PVD), retinopathy, nephropathy, and neuropathy) 2.    Cardiovascular Disease (High Blood Pressure; Congestive Heart Failure; High Cholesterol; Coronary Artery Disease; Angina; Arrhythmias, Dysrhythmias, or Heart Attacks) 3.    Breathing problems (Asthma; obesity-hypoventilation syndrome; obstructive sleep apnea; chronic inflammatory airway disease; reactive airway disease; or shortness of breath) 4.    Chronic kidney disease 5.    Liver disease (nonalcoholic fatty liver disease) 6.    High blood pressure 7.    Acid reflux (gastroesophageal reflux disease; heartburn) 8.    Osteoarthritis (OA) (affecting the hip(s), the knee(s) and/or the lower back) 9.    Low back pain (Lumbar Facet Syndrome; and/or Degenerative Disc Disease) 10.  Hip pain (Osteoarthritis of hip) (For every 1 lbs of added body weight, there is a 2 lbs increase in pressure inside of each hip articulation. 1:2 mechanical relationship) 11.  Knee pain (Osteoarthritis of knee) (For every 1 lbs of added body weight, there is a 4 lbs increase in pressure inside of each knee articulation. 1:4 mechanical relationship) (patients with a BMI>30 kg/m2 were 6.8 times more likely to develop knee OA than  normal-weight  individuals) 12.  Cancer: Epidemiological studies have shown that obesity is a risk factor for: post-menopausal breast cancer; cancers of the endometrium, colon and kidney cancer; malignant adenomas of the oesophagus. Obese subjects have an approximately 1.5-3.5-fold increased risk of developing these cancers compared with normal-weight subjects, and it has been estimated that between 15 and 45% of these cancers can be attributed to overweight. More recent studies suggest that obesity may also increase the risk of other types of cancer, including pancreatic, hepatic and gallbladder cancer. (Ref: Obesity and cancer. Pischon T, Nthlings U, [employer] H. Proc Nutr Soc. 2008 May;67(2):128-45. doi: O4977093.) The International Agency for Research on Cancer (IARC) has identified 13 cancers associated with overweight and obesity: meningioma, multiple myeloma, adenocarcinoma of the esophagus, and cancers of the thyroid, postmenopausal breast cancer, gallbladder, stomach, liver, pancreas, kidney, ovaries, uterus, colon and rectal (colorectal) cancers. 52 percent of all cancers diagnosed in women and 24 percent of those diagnosed in men are associated with overweight and obesity.  Recommendation: At this point it is urgent that you take a step back and concentrate in loosing weight. Dedicate 100% of your efforts on this task. Nothing else will improve your health more than bringing your weight down and your BMI to less than 30. If you are here, you probably have chronic pain. We know that most chronic pain patients have difficulty exercising secondary to their pain. For this reason, you must rely on proper nutrition and diet in order to lose the weight. If your BMI is above 40, you should seriously consider bariatric surgery. A realistic goal is to lose 10% of your body weight over a period of 12 months.  Be honest to yourself, if over time you have unsuccessfully tried to lose weight, then it  is time for you to seek professional help and to enter a medically supervised weight management program, and/or undergo bariatric surgery. Stop procrastinating.   Pain management considerations:  1.    Pharmacological Problems: Be advised that the use of opioid analgesics (oxycodone; hydrocodone; morphine; methadone; codeine; and all of their derivatives) have been associated with decreased metabolism and weight gain.  For this reason, should we see that you are unable to lose weight while taking these medications, it may become necessary for [place] to taper down and indefinitely discontinue them.  2.    Technical Problems: The incidence of successful interventional therapies decreases as the patient's BMI increases. It is much more difficult to accomplish a safe and effective interventional therapy on a patient with a BMI above 35. 3.    Radiation Exposure Problems: The x-rays machine, used to accomplish injection therapies, will automatically increase their x-ray output in order to capture an appropriate bone image. This means that radiation exposure increases exponentially with the patient's BMI. (The higher the BMI, the higher the radiation exposure.) Although the level of radiation used at a given time is still safe to the patient, it is not for the physician and/or assisting staff. Unfortunately, radiation exposure is accumulative. Because physicians and the staff have to do procedures and be exposed on a daily basis, this can result in health problems such as cancer and radiation burns. Radiation exposure to the staff is monitored by the radiation batches that they wear. The exposure levels are reported back to the staff on a quarterly basis. Depending on levels of exposure, physicians and staff may be obligated by law to decrease this exposure. This means that they have the right and obligation to refuse providing therapies  where they may be overexposed to radiation. For this reason, physicians may decline  to offer therapies such as radiofrequency ablation or implants to patients with a BMI above 40. 4.    Current Trends: Be advised that the current trend is to no longer offer certain therapies to patients with a BMI equal to, or above 35, due to increase perioperative risks, increased technical procedural difficulties, and excessive radiation exposure to healthcare personnel.  _________________________________________________________________________________________   ____________________________________________________________________________________________  Patient Information update  To: All of our patients.  Re: Name change.  It has been made official that our current name, "[facility]"   will soon be changed to "[facility]".   The purpose of this change is to eliminate any confusion created by the concept of our practice being a "Medication Management Pain Clinic". In the past this has led to the misconception that we treat pain primarily by the use of prescription medications.  Nothing can be farther from the truth.   Understanding PAIN MANAGEMENT: To further understand what our practice does, you first have to understand that "Pain Management" is a subspecialty that requires additional training once a physician has completed their specialty training, which can be in either Anesthesia, Neurology, Psychiatry, or Physical Medicine and Rehabilitation (PMR). Each one of these contributes to the final approach taken by each physician to the management of their patient's pain. To be a "Pain Management Specialist" you must have first completed one of the specialty trainings below.  Anesthesiologists - trained in clinical pharmacology and interventional techniques such as nerve blockade and regional as well as central neuroanatomy. They are trained to block pain before, during, and after  surgical interventions.  Neurologists - trained in the diagnosis and pharmacological treatment of complex neurological conditions, such as Multiple Sclerosis, Parkinson's, spinal cord injuries, and other systemic conditions that may be associated with symptoms that may include but are not limited to pain. They tend to rely primarily on the treatment of chronic pain using prescription medications.  Psychiatrist - trained in conditions affecting the psychosocial wellbeing of patients including but not limited to depression, anxiety, schizophrenia, personality disorders, addiction, and other substance use disorders that may be associated with chronic pain. They tend to rely primarily on the treatment of chronic pain using prescription medications.   Physical Medicine and Rehabilitation (PMR) physicians, also known as physiatrists - trained to treat a wide variety of medical conditions affecting the brain, spinal cord, nerves, bones, joints, ligaments, muscles, and tendons. Their training is primarily aimed at treating patients that have suffered injuries that have caused severe physical impairment. Their training is primarily aimed at the physical therapy and rehabilitation of those patients. They may also work alongside orthopedic surgeons or neurosurgeons using their expertise in assisting surgical patients to recover after their surgeries.  INTERVENTIONAL PAIN MANAGEMENT is sub-subspecialty of Pain Management.  Our physicians are Board-certified in Anesthesia, Pain Management, and Interventional Pain Management.  This meaning that not only have they been trained and Board-certified in their specialty of Anesthesia, and subspecialty of Pain Management, but they have also received further training in the sub-subspecialty of Interventional Pain Management, in order to become Board-certified as INTERVENTIONAL PAIN MANAGEMENT SPECIALIST.    Mission: Our goal is to use our skills in  [place] as alternatives to the chronic use of prescription opioid medications for the treatment of pain. To make this more clear, we have changed our  name to reflect what we do and offer. We will continue to offer medication management assessment and recommendations, but we will not be taking over any patient's medication management.  ____________________________________________________________________________________________

## 2022-08-11 ENCOUNTER — Ambulatory Visit: Payer: Medicaid Other | Admitting: Pain Medicine

## 2022-08-12 ENCOUNTER — Telehealth: Payer: Self-pay | Admitting: Nurse Practitioner

## 2022-08-12 NOTE — Telephone Encounter (Signed)
Copied from CRM [id number]. Topic: Medicare AWV >> Aug 12, 2022 10:54 AM Rushie Goltz wrote: Reason for CRM: Called patient to schedule Medicare Annual Wellness Visit (AWV). Left message for patient to call back and schedule Medicare Annual Wellness Visit (AWV).  Last date of AWV: AWVI eligible as of 07/04/2019  Please schedule an AWVI appointment at any time with [facility] VISIT.  If any questions, please contact me at [phone].    Thank you,  [facility] Support [facility] Direct dial  [phone]

## 2022-08-12 NOTE — Progress Notes (Unsigned)
Bethanie Dicker, NP-C Phone: [phone]  Sherry Price is a 52 y.o. female who presents today for follow up. She is requesting referrals to Psychiatry for help with her anxiety and depression as well as a new referral to Pain Management. She has a Hx of chronic pain. She reports increased life stressors. She has a Hx of Bipolar disorder. She denies SI/HI today. She is needing refills on her Effexor and Xanax. She is not currently seeing a therapist but has in the past.   HYPERTENSION Disease Monitoring: Blood pressure range- Not checking Chest pain- No      Dyspnea- No Medications: Compliance- Norvasc, HCTZ, Labetalol Lightheadedness- No   Edema- No  Lab Results  Component Value Date   NA 144 05/14/2022   K 3.9 05/14/2022   CO2 32 05/14/2022   GLUCOSE 148 (H) 05/14/2022   BUN 11 05/14/2022   CREATININE 0.71 05/14/2022   CALCIUM 8.9 05/14/2022   GFRNONAA >60 02/18/2022    DIABETES Disease Monitoring: Blood Sugar ranges- Not checking Polyuria/phagia/dipsia- No      Optho- No Medications: Compliance- Metformin and Ozempic Hypoglycemic symptoms- No  Lab Results  Component Value Date   HGBA1C 7.0 (H) 05/14/2022     HYPERLIPIDEMIA Disease Monitoring: See symptoms for Hypertension Medications: Compliance- Lipitor Right upper quadrant pain- No  Muscle aches- No  Lab Results  Component Value Date   CHOL 133 05/14/2022   HDL 53.00 05/14/2022   LDLCALC 65 05/14/2022   TRIG 79.0 05/14/2022   CHOLHDL 3 05/14/2022      Social History   Tobacco Use  Smoking Status Former   Years: 26   Types: Cigarettes  Smokeless Tobacco Never    Current Outpatient Medications on File Prior to Visit  Medication Sig Dispense Refill   amLODipine (NORVASC) 10 MG tablet Take 1 tablet (10 mg total) by mouth daily. 90 tablet 3   Aspirin-Acetaminophen-Caffeine (GOODY HEADACHE PO) Take 1 packet by mouth as needed.     atorvastatin (LIPITOR) 40 MG tablet Take 40 mg by mouth daily.      calcium carbonate (TUMS) 500 MG chewable tablet Chew 1 tablet (200 mg of elemental calcium total) by mouth 2 (two) times daily. 20 tablet 0   labetalol (NORMODYNE) 200 MG tablet Take 1 tablet (200 mg total) by mouth 2 (two) times daily. 180 tablet 3   Semaglutide, 1 MG/DOSE, 4 MG/3ML SOPN Inject 1 mg into the skin once a week. 3 mL 0   Semaglutide, 2 MG/DOSE, 8 MG/3ML SOPN Inject 2 mg as directed once a week. 3 mL 1   No current facility-administered medications on file prior to visit.    ROS see history of present illness  Objective  Physical Exam Vitals:   08/13/22 0846  BP: 134/84  Pulse: 92  Temp: 98.6 F (37 C)  SpO2: 97%    BP Readings from Last 3 Encounters:  08/13/22 134/84  06/24/22 130/68  05/14/22 (!) 144/80   Wt Readings from Last 3 Encounters:  08/13/22 (!) 328 lb 12.8 oz (149.1 kg)  06/24/22 (!) 330 lb (149.7 kg)  05/14/22 (!) 326 lb 6.4 oz (148.1 kg)    Physical Exam Constitutional:      General: She is not in acute distress.    Appearance: Normal appearance. She is obese.  HENT:     Head: Normocephalic.  Cardiovascular:     Rate and Rhythm: Normal rate and regular rhythm.     Heart sounds: Normal heart sounds.  Pulmonary:  Effort: Pulmonary effort is normal.     Breath sounds: Normal breath sounds.  Skin:    General: Skin is warm and dry.  Neurological:     General: No focal deficit present.     Mental Status: She is alert.  Psychiatric:        Mood and Affect: Mood normal.        Behavior: Behavior normal.    Assessment/Plan: Please see individual problem list.  Benign essential hypertension Assessment & Plan: Chronic. Stable on Norvasc 10 mg daily, HCTZ 12.5 mg daily and Labetalol 200 mg BID. Continue. Refills sent.   Orders: -     hydroCHLOROthiazide; Take 0.5 tablets (12.5 mg total) by mouth daily.  Dispense: 45 tablet; Refill: 3  Type 2 diabetes mellitus without complication, without long-term current use of insulin Assessment &  Plan: Chronic. Stable on Ozempic and Metformin. Continue. Patient has remained on 0.25 mg dose weekly, advised to increase to 0.5 mg weekly x 4 weeks then increase to 1 mg weekly. Will monitor. Counseled on common side effects such as nausea, abdominal pain and diarrhea. Encouraged healthy diet and exercise.   Orders: -     metFORMIN HCl; Take 1 tablet (500 mg total) by mouth 2 (two) times daily with a meal.  Dispense: 180 tablet; Refill: 3 -     Semaglutide(0.25 or 0.5MG /DOS); Inject 0.5 mg into the skin once a week. X 4 weeks then increase to 1 mg weekly.  Dispense: 3 mL; Refill: 0 -     Ondansetron; Allow 1-2 tablets to dissolve in your mouth every 8 hours as needed for nausea/vomiting  Dispense: 30 tablet; Refill: 0  Mixed hyperlipidemia Assessment & Plan: Chronic. Stable on Lipitor 40 mg daily. Continue. LDL- 65.   Anxiety Assessment & Plan: Chronic. Stable on Xanax 0.5 mg PRN. Continue. Refills sent. PDMP reviewed. Referral to Psychiatry placed.   Orders: -     ALPRAZolam; Take 1 tablet (0.5 mg total) by mouth daily as needed for anxiety.  Dispense: 30 tablet; Refill: 2 -     Ambulatory referral to Psychiatry  Depressive disorder Assessment & Plan: Chronic. On Effexor XR 150 mg daily. Continue. Refills sent. Referral to Psychiatry placed. Denies SI/HI. Encouraged to contact if worsening symptoms, unusual behavior changes or suicidal thoughts occur.   Orders: -     Venlafaxine HCl ER; Take 1 capsule (150 mg total) by mouth daily with breakfast.  Dispense: 90 capsule; Refill: 3 -     Ambulatory referral to Psychiatry  Body mass index (BMI) of 50-59.9 in adult Assessment & Plan: Encouraged healthy diet and exercise. Will monitor.    Primary osteoarthritis of both knees Assessment & Plan: Chronic. Refills on Ibuprofen sent. Encouraged follow up with Ortho and Pain Management.   Orders: -     Ibuprofen; Take 1 tablet (800 mg total) by mouth every 8 (eight) hours as needed.   Dispense: 90 tablet; Refill: 1 -     Ambulatory referral to Pain Clinic  Chronic pain syndrome -     Ambulatory referral to Pain Clinic   Return in about 3 months (around 11/12/2022) for Follow up.   Bethanie Dicker, NP-C [facility] [employer]

## 2022-08-13 ENCOUNTER — Ambulatory Visit (INDEPENDENT_AMBULATORY_CARE_PROVIDER_SITE_OTHER): Payer: 59 | Admitting: Nurse Practitioner

## 2022-08-13 ENCOUNTER — Encounter: Payer: Self-pay | Admitting: Nurse Practitioner

## 2022-08-13 VITALS — BP 134/84 | HR 92 | Temp 98.6°F | Ht 65.0 in | Wt 328.8 lb

## 2022-08-13 DIAGNOSIS — Z6841 Body Mass Index (BMI) 40.0 and over, adult: Secondary | ICD-10-CM

## 2022-08-13 DIAGNOSIS — F32A Depression, unspecified: Secondary | ICD-10-CM

## 2022-08-13 DIAGNOSIS — E119 Type 2 diabetes mellitus without complications: Secondary | ICD-10-CM | POA: Diagnosis not present

## 2022-08-13 DIAGNOSIS — I10 Essential (primary) hypertension: Secondary | ICD-10-CM | POA: Diagnosis not present

## 2022-08-13 DIAGNOSIS — M170 Bilateral primary osteoarthritis of knee: Secondary | ICD-10-CM

## 2022-08-13 DIAGNOSIS — F419 Anxiety disorder, unspecified: Secondary | ICD-10-CM

## 2022-08-13 DIAGNOSIS — G894 Chronic pain syndrome: Secondary | ICD-10-CM

## 2022-08-13 DIAGNOSIS — E782 Mixed hyperlipidemia: Secondary | ICD-10-CM

## 2022-08-13 MED ORDER — VENLAFAXINE HCL ER 150 MG PO CP24: 150.0000 mg | ORAL_CAPSULE | Freq: Every day | ORAL | 3 refills | Status: DC

## 2022-08-13 MED ORDER — SEMAGLUTIDE(0.25 OR 0.5MG/DOS) 2 MG/3ML ~~LOC~~ SOPN
0.5000 mg | PEN_INJECTOR | SUBCUTANEOUS | 0 refills | Status: DC
Start: 2022-08-13 — End: 2022-10-16

## 2022-08-13 MED ORDER — ONDANSETRON 4 MG PO TBDP
ORAL_TABLET | ORAL | 0 refills | Status: DC
Start: 2022-08-13 — End: 2022-09-15

## 2022-08-13 MED ORDER — ONDANSETRON 4 MG PO TBDP: ORAL_TABLET | ORAL | 0 refills | Status: DC

## 2022-08-13 MED ORDER — HYDROCHLOROTHIAZIDE 25 MG PO TABS: 12.5000 mg | ORAL_TABLET | Freq: Every day | ORAL | 3 refills | Status: DC

## 2022-08-13 MED ORDER — ALPRAZOLAM 0.5 MG PO TABS
0.5000 mg | ORAL_TABLET | Freq: Every day | ORAL | 2 refills | Status: DC | PRN
Start: 2022-08-13 — End: 2022-11-13

## 2022-08-13 MED ORDER — METFORMIN HCL 500 MG PO TABS: 500.0000 mg | ORAL_TABLET | Freq: Two times a day (BID) | ORAL | 3 refills | Status: DC

## 2022-08-13 MED ORDER — IBUPROFEN 800 MG PO TABS: 800.0000 mg | ORAL_TABLET | Freq: Three times a day (TID) | ORAL | 1 refills | Status: DC | PRN

## 2022-08-13 NOTE — Assessment & Plan Note (Signed)
Chronic. Stable on Norvasc 10 mg daily, HCTZ 12.5 mg daily and Labetalol 200 mg BID. Continue. Refills sent.

## 2022-08-13 NOTE — Assessment & Plan Note (Addendum)
Chronic. Refills on Ibuprofen sent. Encouraged follow up with Ortho and Pain Management.

## 2022-08-13 NOTE — Assessment & Plan Note (Signed)
Encouraged healthy diet and exercise. Will monitor.

## 2022-08-13 NOTE — Assessment & Plan Note (Addendum)
Chronic. Stable on Ozempic and Metformin. Continue. Patient has remained on 0.25 mg dose weekly, advised to increase to 0.5 mg weekly x 4 weeks then increase to 1 mg weekly. Will monitor. Counseled on common side effects such as nausea, abdominal pain and diarrhea. Encouraged healthy diet and exercise.

## 2022-08-13 NOTE — Patient Instructions (Signed)
YOUR MAMMOGRAM IS DUE, PLEASE CALL AND GET THIS SCHEDULED! [facility] - call [phone]    

## 2022-08-13 NOTE — Assessment & Plan Note (Addendum)
Chronic. On Effexor XR 150 mg daily. Continue. Refills sent. Referral to Psychiatry placed. Denies SI/HI. Encouraged to contact if worsening symptoms, unusual behavior changes or suicidal thoughts occur.

## 2022-08-13 NOTE — Assessment & Plan Note (Signed)
Chronic. Stable on Xanax 0.5 mg PRN. Continue. Refills sent. PDMP reviewed. Referral to Psychiatry placed.

## 2022-08-13 NOTE — Assessment & Plan Note (Signed)
Chronic. Stable on Lipitor 40 mg daily. Continue. LDL- 65.

## 2022-08-27 ENCOUNTER — Encounter: Payer: Self-pay | Admitting: Physical Medicine & Rehabilitation

## 2022-09-02 ENCOUNTER — Ambulatory Visit
Admission: RE | Admit: 2022-09-02 | Discharge: 2022-09-02 | Disposition: A | Discharge status: Home / Self Care | Payer: 59 | Source: Ambulatory Visit | Attending: Nurse Practitioner | Admitting: Nurse Practitioner

## 2022-09-02 DIAGNOSIS — R1030 Lower abdominal pain, unspecified: Secondary | ICD-10-CM

## 2022-09-02 LAB — POCT I-STAT CREATININE: Creatinine, Ser: 0.8 mg/dL (ref 0.44–1.00)

## 2022-09-02 MED ORDER — IOHEXOL 350 MG/ML SOLN
100.0000 mL | Freq: Once | INTRAVENOUS | Status: AC | PRN
  Administered 2022-09-02: 100 mL via INTRAVENOUS

## 2022-09-12 ENCOUNTER — Other Ambulatory Visit: Payer: Self-pay | Admitting: Nurse Practitioner

## 2022-09-12 DIAGNOSIS — E119 Type 2 diabetes mellitus without complications: Secondary | ICD-10-CM

## 2022-09-16 ENCOUNTER — Ambulatory Visit (INDEPENDENT_AMBULATORY_CARE_PROVIDER_SITE_OTHER): Payer: 59 | Admitting: Podiatry

## 2022-09-16 ENCOUNTER — Encounter: Payer: 59 | Admitting: Physical Medicine & Rehabilitation

## 2022-09-16 DIAGNOSIS — B351 Tinea unguium: Secondary | ICD-10-CM

## 2022-09-16 DIAGNOSIS — M79674 Pain in right toe(s): Secondary | ICD-10-CM

## 2022-09-16 DIAGNOSIS — M79675 Pain in left toe(s): Secondary | ICD-10-CM

## 2022-09-16 DIAGNOSIS — E119 Type 2 diabetes mellitus without complications: Secondary | ICD-10-CM | POA: Diagnosis not present

## 2022-09-16 NOTE — Progress Notes (Signed)
   No chief complaint on file.   SUBJECTIVE Patient with a history of diabetes mellitus presents to office today complaining of elongated, thickened nails that cause pain while ambulating in shoes.  Patient is unable to trim their own nails. Patient is here for further evaluation and treatment.  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   Panic attacks    Personal history of chemotherapy    Personal history of radiation therapy    Sleep apnea     Allergies  Allergen Reactions   Losartan Hives     OBJECTIVE General Patient is awake, alert, and oriented x 3 and in no acute distress. Derm Skin is dry and supple bilateral. Negative open lesions or macerations. Remaining integument unremarkable. Nails are tender, long, thickened and dystrophic with subungual debris, consistent with onychomycosis, 1-5 bilateral. No signs of infection noted. Vasc  DP and PT pedal pulses palpable bilaterally. Temperature gradient within normal limits.  Neuro Epicritic and protective threshold sensation diminished bilaterally.  Musculoskeletal Exam No symptomatic pedal deformities noted bilateral. Muscular strength within normal limits.  ASSESSMENT 1. Diabetes Mellitus w/ peripheral neuropathy 2.  Pain due to onychomycosis of toenails bilateral 3.  Encounter for diabetic foot exam  PLAN OF CARE 1. Patient evaluated today.  Comprehensive diabetic foot exam performed today 2. Instructed to maintain good pedal hygiene and foot care. Stressed importance of controlling blood sugar.  3. Mechanical debridement of nails 1-5 bilaterally performed using a nail nipper. Filed with dremel without  incident.  4. Return to clinic in 3 mos.     Felecia Shelling, DPM [facility]  Dr. Felecia Shelling, DPM    [address] [place], [place] [address]                Office [phone]  Fax [phone]

## 2022-10-06 ENCOUNTER — Encounter: Payer: Medicaid Other | Admitting: Obstetrics and Gynecology

## 2022-10-10 ENCOUNTER — Other Ambulatory Visit: Payer: Self-pay | Admitting: Nurse Practitioner

## 2022-10-10 DIAGNOSIS — E119 Type 2 diabetes mellitus without complications: Secondary | ICD-10-CM

## 2022-10-10 NOTE — Telephone Encounter (Signed)
Okay to fill last OV 09/16/22

## 2022-10-15 ENCOUNTER — Telehealth: Payer: Self-pay | Admitting: Nurse Practitioner

## 2022-10-15 NOTE — Telephone Encounter (Signed)
Pt called in stating that she tested positive for Covid today and was wondering if provider can sent med over to her pharmacy? Her symptoms are cough, chest discomfort due to the cough, and congested.

## 2022-10-15 NOTE — Telephone Encounter (Signed)
Called and got pt scheduled a VV with pcp

## 2022-10-16 ENCOUNTER — Telehealth (INDEPENDENT_AMBULATORY_CARE_PROVIDER_SITE_OTHER): Payer: 59 | Admitting: Nurse Practitioner

## 2022-10-16 VITALS — Ht 65.0 in | Wt 328.0 lb

## 2022-10-16 DIAGNOSIS — U071 COVID-19: Secondary | ICD-10-CM

## 2022-10-16 MED ORDER — MOLNUPIRAVIR EUA 200MG CAPSULE
4.0000 | ORAL_CAPSULE | Freq: Two times a day (BID) | ORAL | 0 refills | Status: DC
Start: 2022-10-16 — End: 2022-10-21

## 2022-10-16 NOTE — Progress Notes (Signed)
MyChart Video Visit    Virtual Visit via Video Note   This visit type was conducted because this format is felt to be most appropriate for this patient at this time. Physical exam was limited by quality of the video and audio technology used for the visit. CMA was able to get the patient set up on a video visit.  Patient location: Home. Patient and provider in visit Provider location: Office  I discussed the limitations of evaluation and management by telemedicine and the availability of in person appointments. The patient expressed understanding and agreed to proceed.  Visit Date: 10/16/2022  Today's healthcare provider: Bethanie Dicker, NP     Subjective:    Patient ID: Sherry Price, female    DOB: [birth year], 52 y.o.   MRN: [id number]  Chief Complaint  Patient presents with   Covid Positive    Itchy throat, slight head ache, cough, and congestion. Started Tuesday evening took covid test on 6/12 and was positive.     HPI  Symptoms started on Tuesday evening. She took a COVID test yesterday that was positive. Her symptoms have remained unchanged over the last 2 days. She is interested in antiviral treatment.   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   Panic attacks     Personal history of chemotherapy    Personal history of radiation therapy    Sleep apnea     Past Surgical History:  Procedure Laterality Date   BREAST BIOPSY Left 2013   +   BREAST BIOPSY Right    neg   BREAST LUMPECTOMY  2013   BREAST MAMMOSITE Left February 2013   BREAST REDUCTION SURGERY  2005   COLONOSCOPY WITH PROPOFOL N/A 01/09/2015   Procedure: COLONOSCOPY WITH PROPOFOL;  Surgeon: Midge Minium, MD;  Location: [facility] ENDOSCOPY;  Service: Endoscopy;  Laterality: N/A;   ESOPHAGOGASTRODUODENOSCOPY (EGD) WITH PROPOFOL N/A 01/09/2015   Procedure: ESOPHAGOGASTRODUODENOSCOPY (EGD) WITH PROPOFOL;  Surgeon: Midge Minium, MD;  Location: [facility] ENDOSCOPY;  Service: Endoscopy;  Laterality: N/A;   OOPHORECTOMY Right 2013   REDUCTION MAMMAPLASTY      Family History  Problem Relation Age of Onset   Hypertension Mother    Heart disease Mother    Diabetes Mother    Breast cancer Cousin     Social History   Socioeconomic History   Marital status: Single    Spouse name: Not on file   Number of children: Not on file   Years of education: Not on file   Highest education level: Not on file  Occupational History   Not on file  Tobacco Use   Smoking status: Former    Years: 26    Types: Cigarettes   Smokeless tobacco: Never  Vaping Use   Vaping Use: Never used  Substance and Sexual Activity   Alcohol use: Yes    Comment: occasionally   Drug use: No   Sexual activity: Not on file  Other Topics Concern   Not on file  Social History Narrative   Not on file   Social Determinants of Health   Financial Resource Strain: Not on file  Food Insecurity: Not on file  Transportation Needs: Not on file  Physical Activity: Not on file  Stress: Not on file  Social Connections: Not on file  Intimate Partner Violence: Not on file    Outpatient Medications Prior to Visit  Medication Sig Dispense Refill   ALPRAZolam (XANAX) 0.5 MG tablet Take 1 tablet (0.5 mg total) by mouth daily as needed  for anxiety. 30 tablet 2   amLODipine (NORVASC) 10 MG tablet Take 1 tablet (10 mg total) by mouth daily. 90 tablet 3   Aspirin-Acetaminophen-Caffeine (GOODY HEADACHE PO) Take 1 packet by mouth as needed.     atorvastatin (LIPITOR) 40 MG tablet Take 40 mg by mouth daily.     calcium carbonate (TUMS) 500 MG chewable tablet Chew 1 tablet (200 mg of elemental calcium total) by mouth 2 (two) times daily. 20 tablet 0   hydrochlorothiazide (HYDRODIURIL) 25 MG tablet Take 0.5 tablets (12.5 mg total) by mouth daily. 45 tablet 3   ibuprofen (ADVIL) 800 MG tablet Take 1 tablet (800 mg total) by mouth every 8 (eight) hours as needed. 90 tablet 1   labetalol (NORMODYNE) 200 MG tablet Take 1 tablet (200 mg total) by mouth 2 (two) times daily. 180 tablet 3   metFORMIN (GLUCOPHAGE) 500 MG tablet Take 1 tablet (500 mg total) by mouth 2 (two) times daily with a meal. 180 tablet 3   ondansetron (ZOFRAN-ODT) 4 MG disintegrating tablet DISSOLVE 1 TO 2 TABLETS ON THE TONGUE EVERY 8 HOURS AS NEEDED FOR NAUSEA OR VOMITING 30 tablet 2   Semaglutide, 2 MG/DOSE, 8 MG/3ML SOPN Inject 2 mg as directed once a week. 3 mL 1   venlafaxine XR (EFFEXOR-XR) 150 MG 24 hr capsule Take 1 capsule (150 mg total) by mouth daily with breakfast. 90 capsule 3   Semaglutide, 1 MG/DOSE, 4 MG/3ML SOPN Inject 1 mg into the skin once a week. 3 mL 0   Semaglutide,0.25 or 0.5MG /DOS, 2 MG/3ML SOPN Inject 0.5 mg into the skin once a week. X 4 weeks then increase to 1 mg weekly. 3 mL 0   No facility-administered medications prior to visit.    Allergies  Allergen Reactions   Losartan Hives    ROS See HPI    Objective:    Physical Exam  Ht 5\' 5"  (1.651 m)   Wt (!) 328 lb (148.8 kg)   LMP  (LMP Unknown)   BMI 54.58 kg/m  Wt Readings from Last 3 Encounters:  10/16/22 (!) 328 lb (148.8 kg)  08/13/22 (!) 328 lb 12.8 oz (149.1 kg)  06/24/22 (!) 330 lb (149.7 kg)   GENERAL: alert, oriented, appears well and in no acute distress   HEENT:  atraumatic, conjunttiva clear, no obvious abnormalities on inspection of external nose and ears   NECK: normal movements of the head and neck   LUNGS: on inspection no signs of respiratory distress, breathing rate appears normal, no obvious gross SOB, gasping or wheezing   CV: no obvious cyanosis   MS: moves all visible extremities without noticeable abnormality   PSYCH/NEURO: pleasant and cooperative, no obvious depression  or anxiety, speech and thought processing grossly intact     Assessment & Plan:   Problem List Items Addressed This Visit       Other   Positive self-administered antigen test for COVID-19 - Primary    COVID positive yesterday, symptoms started the day prior. Requesting antiviral. Will treat with Molnupiravir 800 mg BID x 5 days. Counseled on side effects. Encouraged adequate fluid intake. She can continue OTC symptomatic treatment. Counseled on quarantine protocol. Strict precautions given to patient.       Relevant Medications   molnupiravir EUA (LAGEVRIO) 200 mg CAPS capsule    I have discontinued Hector Brunswick. Bowermaster's Semaglutide (1 MG/DOSE) and Semaglutide(0.25 or 0.5MG /DOS). I am also having her start on molnupiravir EUA. Additionally, I am having her maintain her Aspirin-Acetaminophen-Caffeine (GOODY HEADACHE PO), calcium carbonate, atorvastatin, labetalol, amLODipine, Semaglutide (2 MG/DOSE), hydrochlorothiazide, ibuprofen, metFORMIN, venlafaxine XR, ALPRAZolam, and ondansetron.  Meds ordered this encounter  Medications   molnupiravir EUA (LAGEVRIO) 200 mg CAPS capsule    Sig: Take 4 capsules (800 mg total) by mouth 2 (two) times daily for 5 days.    Dispense:  40 capsule    Refill:  0    Order Specific Question:   Supervising Provider    Answer:   Birdie Sons, ERIC G [[id number]]    I discussed the assessment and treatment plan with the patient. The patient was provided an opportunity to ask questions and all were answered. The patient agreed with the plan and  demonstrated an understanding of the instructions.   The patient was advised to call back or seek an in-person evaluation if the symptoms worsen or if the condition fails to improve as anticipated.   Bethanie Dicker, NP [facility] [phone] (phone) [phone] (fax)  [facility]

## 2022-10-16 NOTE — Assessment & Plan Note (Signed)
COVID positive yesterday, symptoms started the day prior. Requesting antiviral. Will treat with Molnupiravir 800 mg BID x 5 days. Counseled on side effects. Encouraged adequate fluid intake. She can continue OTC symptomatic treatment. Counseled on quarantine protocol. Strict precautions given to patient.

## 2022-10-21 ENCOUNTER — Encounter: Payer: Self-pay | Admitting: Physical Medicine & Rehabilitation

## 2022-10-21 ENCOUNTER — Encounter: Payer: 59 | Attending: Physical Medicine & Rehabilitation | Admitting: Physical Medicine & Rehabilitation

## 2022-10-21 VITALS — BP 104/68 | HR 78 | Ht 65.0 in | Wt 327.0 lb

## 2022-10-21 DIAGNOSIS — Z79891 Long term (current) use of opiate analgesic: Secondary | ICD-10-CM | POA: Insufficient documentation

## 2022-10-21 DIAGNOSIS — G894 Chronic pain syndrome: Secondary | ICD-10-CM | POA: Diagnosis not present

## 2022-10-21 DIAGNOSIS — Z5181 Encounter for therapeutic drug level monitoring: Secondary | ICD-10-CM | POA: Diagnosis not present

## 2022-10-21 NOTE — Progress Notes (Signed)
Subjective:    Patient ID: Sherry Price, female    DOB: January 07, 1971, 52 y.o.   MRN: [id number]  HPI  HPI  Sherry Price is a 52 y.o. 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   They are presenting to PM&R clinic as a new patient for pain management evaluation. They were referred by Gardiner Rhyme for bilateral knee pain.  Her pain is 7-8 out of 10 worsens with walking.  Patient reports she has had bilateral knee pain for many years.  Pain is worsened with activity.  Patient reports occasional right back pain mostly when ambulating longer distances.  Furthermore she also has pain due to muscle spasms in her left arm.  She thinks this is related to prior treatment of cancer of her left breast.  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  She typically walks without a cane for short distances but uses a walker for longer distances.  She has been seen by [facility] sports medicine.  She has received cortisone shots in her knees with benefit.  Cortisone does help her knee pain bilaterally however she feels like she is still very limited by her pain.  She reports her last knee injections were in March.  She says she had gel injections years ago " rooster comb" and is not sure how these helped.  She did not continue these injections regularly at the time.  She reports previously using oxycodone/Xtampza from [facility].  Patient says she discontinued seeing [facility] because they used a different insurance for 1 visit resulting in a $300 bill.  As she was unable to pay this bill she was unable to continue following with [facility].  She feels  like this provided her improved pain control and she was able to walk a lot further.    Red flag symptoms: No red flags for back pain endorsed in Hx or ROS  Medications tried:  Topical medications  Nsaids- ibuprofen- helps slightly, she is currently using Tylenol  -did not help much in the past Opiates  Oyxoconde and xtamaza -were helping control her pain until he would discontinued several months ago Tramadol- didn't help Hydrocodone- She doesn't recall Gabapentin / Lyrica - Denies use TCAs  - Denies use SNRIs -currently on venlafaxine   Other treatments: Knee braces help PT- few months ago, helped her pain  TENs unit- Denies  Injections- Gel injections didn't help- tried years ago , cortisone helps - last in march  Surgery- limted by wt, taking ozempic    Prior UDS results: No results found for: "LABOPIA", "COCAINSCRNUR", "LABBENZ", "AMPHETMU", "THCU", "LABBARB"    Pain Inventory Average Pain 7 Pain Right Now 9 My pain is aching  In the last 24 hours, has pain interfered with the following? General activity 4 Relation with others 0 Enjoyment of life 3 What TIME of day is your pain at its worst? varies Sleep (in general) Fair  Pain is worse with: walking, bending, and standing Pain improves with: rest, pacing activities, medication, and injections Relief from Meds:  no answer given  walk without assistance walk with assistance use a walker how many minutes can you walk? 15 ability to climb steps?  yes  employed # of hrs/week 30 disabled: date disabled 2013  weakness numbness tingling depression anxiety  Any changes since last visit?  yes      Family History  Problem Relation Age of Onset   Hypertension Mother    Heart disease Mother    Diabetes Mother    Breast cancer Cousin    Social History   Socioeconomic History   Marital status: Single    Spouse name: Not on file   Number of children: Not on file   Years of education: Not on file    Highest education level: Not on file  Occupational History   Not on file  Tobacco Use   Smoking status: Former    Years: 26    Types: Cigarettes   Smokeless tobacco: Never  Vaping Use   Vaping Use: Never used  Substance and Sexual Activity   Alcohol use: Yes    Comment: occasionally   Drug use: No   Sexual activity: Not on file  Other Topics Concern   Not on file  Social History Narrative   Not on file   Social Determinants of Health   Financial Resource Strain: Not on file  Food Insecurity: Not on file  Transportation Needs: Not on file  Physical Activity: Not on file  Stress: Not on file  Social Connections: Not on file   Past Surgical History:  Procedure Laterality Date   BREAST BIOPSY Left 2013   +   BREAST BIOPSY Right    neg   BREAST LUMPECTOMY  2013   BREAST MAMMOSITE Left February 2013   BREAST REDUCTION SURGERY  2005   COLONOSCOPY WITH PROPOFOL N/A 01/09/2015   Procedure: COLONOSCOPY WITH PROPOFOL;  Surgeon: Midge Minium, MD;  Location: [facility] ENDOSCOPY;  Service: Endoscopy;  Laterality: N/A;   ESOPHAGOGASTRODUODENOSCOPY (EGD) WITH PROPOFOL N/A 01/09/2015   Procedure: ESOPHAGOGASTRODUODENOSCOPY (EGD) WITH PROPOFOL;  Surgeon: Midge Minium, MD;  Location: [facility] ENDOSCOPY;  Service: Endoscopy;  Laterality: N/A;   OOPHORECTOMY Right 2013   REDUCTION MAMMAPLASTY     Past Medical History:  Diagnosis Date   Arthritis    Breast cancer (HCC)    left breast - chemo and radiation   Diabetes mellitus without complication (HCC)    Hypertension    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   Panic attacks    Personal history of chemotherapy    Personal history of radiation therapy    Sleep apnea    BP 104/68   Pulse 78   Ht 5\' 5"  (1.651 m)   Wt (!) 327 lb (148.3 kg)   LMP  (LMP Unknown)   SpO2 95%   BMI 54.42 kg/m   Opioid  Risk Score:   Fall Risk Score:  `1  Depression screen [facility] 2/9     10/21/2022   10:41 AM 06/24/2022    3:08 PM 05/14/2022   10:48 AM  Depression screen PHQ 2/9  Decreased Interest 1 2 0  Down, Depressed, Hopeless 1 1 0  PHQ - 2 Score 2 3 0  Altered sleeping 2 2   Tired, decreased energy 3 3   Change in appetite 2 1   Feeling bad or failure about yourself  1 1   Trouble concentrating 1 2   Moving slowly or fidgety/restless 0 0   Suicidal thoughts  0   PHQ-9  Score 11 12   Difficult doing work/chores  Somewhat difficult      Review of Systems  Constitutional: Negative.   HENT: Negative.    Eyes: Negative.   Respiratory: Negative.    Cardiovascular: Negative.   Gastrointestinal: Negative.   Endocrine: Negative.   Genitourinary: Negative.   Musculoskeletal:  Positive for arthralgias, back pain, gait problem and myalgias.  Skin: Negative.   Allergic/Immunologic: Negative.   Hematological: Negative.   Psychiatric/Behavioral:  Positive for dysphoric mood.   All other systems reviewed and are negative.      Objective:   Physical Exam   Gen: no distress, normal appearing, obese HEENT: oral mucosa pink and moist, NCAT Cardio: Reg rate Chest: normal effort, normal rate of breathing Abd: soft, non-distended Ext: no edema Psych: pleasant, normal affect Skin: intact Neuro: Alert and awake, follows commands, cranial nerves II through XII grossly intact, normal speech and language No focal motor or sensory deficits noted DTR normal and symmetric Musculoskeletal:  Tenderness anterior knee bilaterally right greater than left No significant lower back or paraspinal tenderness noted Bilateral SLR resulted in knee pain  Anterior and posterior drawer sign negative No significant pain with varus and valgus stress Bilateral knee joint crepitus noted Pain with passive ROM of bilateral knees  Assessment & Plan:   Bilateral knee OA -Opiate risk tool moderate -Previously on  Xtampza and oxycodone as needed.  She indicates her function has significantly worsened since she discontinued use of these medications. Could consider TENS unit, caution due to history of prior cancer -Advised to request records from [facility] -Plan to restart oxycodone 5 mg twice daily as needed pending results of UDS -UDS and pain agreement today -Monitor routine pill counts -Continue cortisone injections, plan for bilateral knee injections next visit.  She reports her DM is well-controlled -Consider retrying gel injections or trying zilrettta injection or referral for genicular nerve blocks if she stops getting good results with cortisone injections  Muscles spasms L arm  -Discussed trying magnesium supplementation  Morbid obesity -She is currently on Ozempic -Provided list of medications for chronic pain and weight loss  Addendum 7/24. UDS overall consistent. Will start medication as planned, reiterate she should not use ETOH after starting this medication

## 2022-10-25 LAB — TOXASSURE SELECT,+ANTIDEPR,UR

## 2022-10-27 ENCOUNTER — Telehealth: Payer: Self-pay

## 2022-10-27 NOTE — Telephone Encounter (Signed)
Called pt due to pt being on the schedule for a virtual appt for "possible strep throat" I informed her that we usually do appts for strep in person to swab.   Pt informed me that she does not have transportation and that she had covid on the 18th you treated her and she felt fine she was advised she could leave out the house by Saturday and then her throat started hurting on one side which is why she booked the appt for strep.

## 2022-10-28 ENCOUNTER — Encounter: Payer: Self-pay | Admitting: Nurse Practitioner

## 2022-10-28 ENCOUNTER — Telehealth (INDEPENDENT_AMBULATORY_CARE_PROVIDER_SITE_OTHER): Payer: 59 | Admitting: Nurse Practitioner

## 2022-10-28 VITALS — Ht 65.0 in | Wt 327.0 lb

## 2022-10-28 DIAGNOSIS — J029 Acute pharyngitis, unspecified: Secondary | ICD-10-CM

## 2022-10-28 MED ORDER — AMOXICILLIN 875 MG PO TABS
875.0000 mg | ORAL_TABLET | Freq: Two times a day (BID) | ORAL | 0 refills | Status: AC
Start: 2022-10-28 — End: 2022-11-07

## 2022-10-28 NOTE — Assessment & Plan Note (Addendum)
Will treat with Amoxicillin 875 BID x 10 days. Encouraged adequate fluid intake. She can continue Tylenol and Ibuprofen as needed for pain. Advised to sip warm fluids, she can continue eating ice pops. Information provided to patient. Strict precautions given to patient. Advised to seek in person care if symptoms are changing or worsening.

## 2022-10-28 NOTE — Progress Notes (Signed)
MyChart Video Visit    Virtual Visit via Video Note   This visit type was conducted because this format is felt to be most appropriate for this patient at this time. Physical exam was limited by quality of the video and audio technology used for the visit. CMA was able to get the patient set up on a video visit.  Patient location: Home. Patient and provider in visit Provider location: Office  I discussed the limitations of evaluation and management by telemedicine and the availability of in person appointments. The patient expressed understanding and agreed to proceed.  Visit Date: 10/28/2022  Today's healthcare provider: Bethanie Dicker, NP     Subjective:    Patient ID: Sherry Price, female    DOB: 1971-02-12, 52 y.o.   MRN: [id number]  Chief Complaint  Patient presents with   Sore Throat    Sore on the right side. Slight cough hard to swallow, and nasal drainage     HPI  Patient with sore throat and pain with swallowing. Pain is worse on the right side of Sherry Price throat. Symptoms started approximately one week ago. She has had some body aches. Denies fevers. She has had some congestion in Sherry Price sinuses and drainage. She tested positive at home for COVID on 10/15/2022 and was treated with Molnupiravir, she completed the entire course of medication. Denies chest pain. Denies shortness of breath or difficulty breathing. She has attempted to look in Sherry Price throat and has noticed some redness. She has been getting relief eating ice pops.   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   Panic attacks    Personal history of chemotherapy    Personal history of radiation  therapy    Sleep apnea     Past Surgical History:  Procedure Laterality Date   BREAST BIOPSY Left 2013   +   BREAST BIOPSY Right    neg   BREAST LUMPECTOMY  2013   BREAST MAMMOSITE Left February 2013   BREAST REDUCTION SURGERY  2005   COLONOSCOPY WITH PROPOFOL N/A 01/09/2015   Procedure: COLONOSCOPY WITH PROPOFOL;  Surgeon: Midge Minium, MD;  Location: [facility] ENDOSCOPY;  Service: Endoscopy;  Laterality: N/A;   ESOPHAGOGASTRODUODENOSCOPY (EGD) WITH PROPOFOL N/A 01/09/2015   Procedure: ESOPHAGOGASTRODUODENOSCOPY (EGD) WITH PROPOFOL;  Surgeon: Midge Minium, MD;  Location: [facility] ENDOSCOPY;  Service: Endoscopy;  Laterality: N/A;   OOPHORECTOMY Right 2013   REDUCTION MAMMAPLASTY      Family History  Problem Relation Age of Onset   Hypertension Mother    Heart disease Mother    Diabetes Mother    Breast cancer Cousin     Social History   Socioeconomic History   Marital status: Single    Spouse name: Not on file   Number of children: Not on file   Years of education: Not on file   Highest education level: Not on file  Occupational History   Not on file  Tobacco Use   Smoking status: Former    Years: 26    Types: Cigarettes   Smokeless tobacco: Never  Vaping Use   Vaping Use: Never used  Substance and  Sexual Activity   Alcohol use: Yes    Comment: occasionally   Drug use: No   Sexual activity: Not on file  Other Topics Concern   Not on file  Social History Narrative   Not on file   Social Determinants of Health   Financial Resource Strain: Not on file  Food Insecurity: Not on file  Transportation Needs: Not on file  Physical Activity: Not on file  Stress: Not on file  Social Connections: Not on file  Intimate Partner Violence: Not on file    Outpatient Medications Prior to Visit  Medication Sig Dispense Refill   ALPRAZolam (XANAX) 0.5 MG tablet Take 1 tablet (0.5 mg total) by mouth daily as needed for anxiety. 30 tablet 2   amLODipine (NORVASC) 10 MG tablet Take 1  tablet (10 mg total) by mouth daily. 90 tablet 3   Aspirin-Acetaminophen-Caffeine (GOODY HEADACHE PO) Take 1 packet by mouth as needed.     atorvastatin (LIPITOR) 40 MG tablet Take 40 mg by mouth daily.     calcium carbonate (TUMS) 500 MG chewable tablet Chew 1 tablet (200 mg of elemental calcium total) by mouth 2 (two) times daily. 20 tablet 0   hydrochlorothiazide (HYDRODIURIL) 25 MG tablet Take 0.5 tablets (12.5 mg total) by mouth daily. 45 tablet 3   ibuprofen (ADVIL) 800 MG tablet Take 1 tablet (800 mg total) by mouth every 8 (eight) hours as needed. 90 tablet 1   labetalol (NORMODYNE) 200 MG tablet Take 1 tablet (200 mg total) by mouth 2 (two) times daily. 180 tablet 3   metFORMIN (GLUCOPHAGE) 500 MG tablet Take 1 tablet (500 mg total) by mouth 2 (two) times daily with a meal. 180 tablet 3   ondansetron (ZOFRAN-ODT) 4 MG disintegrating tablet DISSOLVE 1 TO 2 TABLETS ON THE TONGUE EVERY 8 HOURS AS NEEDED FOR NAUSEA OR VOMITING 30 tablet 2   Semaglutide, 2 MG/DOSE, 8 MG/3ML SOPN Inject 2 mg as directed once a week. 3 mL 1   venlafaxine XR (EFFEXOR-XR) 150 MG 24 hr capsule Take 1 capsule (150 mg total) by mouth daily with breakfast. 90 capsule 3   No facility-administered medications prior to visit.    Allergies  Allergen Reactions   Losartan Hives    ROS See HPI    Objective:    Physical Exam  Ht 5\' 5"  (1.651 m)   Wt (!) 327 lb (148.3 kg)   LMP  (LMP Unknown)   BMI 54.42 kg/m  Wt Readings from Last 3 Encounters:  10/28/22 (!) 327 lb (148.3 kg)  10/21/22 (!) 327 lb (148.3 kg)  10/16/22 (!) 328 lb (148.8 kg)   GENERAL: alert, oriented, appears well and in no acute distress   HEENT: atraumatic, conjunttiva clear, no obvious abnormalities on inspection of external nose and ears   NECK: normal movements of the head and neck   LUNGS: on inspection no signs of respiratory distress, breathing rate appears normal, no obvious gross SOB, gasping or wheezing   CV: no obvious  cyanosis   MS: moves all visible extremities without noticeable abnormality   PSYCH/NEURO: pleasant and cooperative, no obvious depression or anxiety, speech and thought processing grossly intact      Assessment & Plan:   Problem List Items Addressed This Visit       Respiratory   Pharyngitis - Primary    Will treat with Amoxicillin 875 BID x 10 days. Encouraged adequate fluid intake. She can continue Tylenol and Ibuprofen as needed for pain.  Advised to sip warm fluids, she can continue eating ice pops. Information provided to patient. Strict precautions given to patient. Advised to seek in person care if symptoms are changing or worsening.       Relevant Medications   amoxicillin (AMOXIL) 875 MG tablet    I am having Keisi D. Liss start on amoxicillin. I am also having Sherry Price maintain Sherry Price Aspirin-Acetaminophen-Caffeine (GOODY HEADACHE PO), calcium carbonate, atorvastatin, labetalol, amLODipine, Semaglutide (2 MG/DOSE), hydrochlorothiazide, ibuprofen, metFORMIN, venlafaxine XR, ALPRAZolam, and ondansetron.  Meds ordered this encounter  Medications   amoxicillin (AMOXIL) 875 MG tablet    Sig: Take 1 tablet (875 mg total) by mouth 2 (two) times daily for 10 days.    Dispense:  20 tablet    Refill:  0    Order Specific Question:   Supervising Provider    Answer:   Birdie Sons, ERIC G [[id number]]    I discussed the assessment and treatment plan with the patient. The patient was provided an opportunity to ask questions and all were answered. The patient agreed with the plan and demonstrated an understanding of the instructions.   The patient was advised to call back or seek an in-person evaluation if the symptoms worsen or if the condition fails to improve as anticipated.   Bethanie Dicker, NP [facility] [employer] at [facility] [phone] (phone) [phone] (fax)  [facility]

## 2022-11-12 NOTE — Progress Notes (Signed)
Bethanie Dicker, NP-C Phone: [phone]  Sherry Price is a 52 y.o. female who presents today for follow up.   HYPERTENSION Disease Monitoring: Blood pressure range- Not checking Chest pain- No      Dyspnea- No Medications: Compliance- Norvasc, Hydrochlorothiazide, Labetalol  Lightheadedness- No   Edema- No  Lab Results  Component Value Date   NA 137 11/13/2022   K 3.7 11/13/2022   CO2 29 11/13/2022   GLUCOSE 180 (H) 11/13/2022   BUN 12 11/13/2022   CREATININE 0.70 11/13/2022   CALCIUM 9.7 11/13/2022   GFRNONAA >60 02/18/2022    DIABETES Disease Monitoring: Blood Sugar ranges- Not checking Polyuria/phagia/dipsia- No      Optho- Yes Medications: Compliance- Ozempic and Metformin Hypoglycemic symptoms- No  Lab Results  Component Value Date   HGBA1C 6.8 (H) 11/13/2022     HYPERLIPIDEMIA Disease Monitoring: See symptoms for Hypertension Medications: Compliance- Lipitor Right upper quadrant pain- No  Muscle aches- No  Lab Results  Component Value Date   CHOL 133 05/14/2022   HDL 53.00 05/14/2022   LDLCALC 65 05/14/2022   TRIG 79.0 05/14/2022   CHOLHDL 3 05/14/2022    Anxiety/Depression- Worsening depression symptoms. More emotional, having ups and downs. She has been on Effexor XR 150 mg daily for several years. She continues to take Xanax 0.5 mg daily as needed for her anxiety. PHQ- 8 and GAD- 9 today. Denies SI/HI.   Right Knee Pain- Patient fell one week ago, tripped over her grandchild's wagon and hit her right knee on the wooden edge of her couch. She has had increased pain with walking, weight bearings and movements since. Her pain is worse when going from sitting to standing. Her pain is an 8/10 today. She does believe she heard something pop in her knee when she hit it. 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 She has been taking Ibuprofen to help with her pain. She is scheduled to see Pain Management in 2 weeks for a follow up visit and knee injections for her  OA.   Social History   Tobacco Use  Smoking Status Former   Types: Cigarettes  Smokeless Tobacco Never    Current Outpatient Medications on File Prior to Visit  Medication Sig Dispense Refill   amLODipine (NORVASC) 10 MG tablet Take 1 tablet (10 mg total) by mouth daily. 90 tablet 3   Aspirin-Acetaminophen-Caffeine (GOODY HEADACHE PO) Take 1 packet by mouth as needed.     atorvastatin (LIPITOR) 40 MG tablet Take 40 mg by mouth daily.     calcium carbonate (TUMS) 500 MG chewable tablet Chew 1 tablet (200 mg of elemental calcium total) by mouth 2 (two) times daily. 20 tablet 0   hydrochlorothiazide (HYDRODIURIL) 25 MG tablet Take 0.5 tablets (12.5 mg total) by mouth daily. 45 tablet 3   labetalol (NORMODYNE) 200 MG tablet Take 1 tablet (200 mg total) by mouth 2 (two) times daily. 180 tablet 3   metFORMIN (GLUCOPHAGE) 500 MG tablet Take 1 tablet (500 mg total) by mouth 2 (two) times daily with a meal. 180 tablet 3   ondansetron (ZOFRAN-ODT) 4 MG disintegrating tablet DISSOLVE 1 TO 2 TABLETS ON THE TONGUE EVERY 8 HOURS AS NEEDED FOR NAUSEA OR VOMITING 30 tablet 2   venlafaxine XR (EFFEXOR-XR) 150 MG 24 hr capsule Take 1 capsule (150 mg total) by mouth daily with breakfast. 90 capsule 3   No current facility-administered medications on file prior to visit.    ROS see history of  present illness  Objective  Physical Exam Vitals:   11/13/22 0901  BP: 128/72  Pulse: 74  Temp: 98.2 F (36.8 C)  SpO2: 99%    BP Readings from Last 3 Encounters:  11/13/22 128/72  10/21/22 104/68  08/13/22 134/84   Wt Readings from Last 3 Encounters:  11/13/22 (!) 325 lb 3.2 oz (147.5 kg)  10/28/22 (!) 327 lb (148.3 kg)  10/21/22 (!) 327 lb (148.3 kg)    Physical Exam Constitutional:      General: She is not in acute distress.    Appearance: Normal appearance. She is obese.  HENT:     Head: Normocephalic.  Cardiovascular:     Rate and Rhythm: Normal rate and regular rhythm.     Heart  sounds: Normal heart sounds.  Pulmonary:     Effort: Pulmonary effort is normal.     Breath sounds: Normal breath sounds.  Musculoskeletal:     Right knee: No swelling. Decreased range of motion (pain with movements). Tenderness (generalized) present.     Left knee: Normal.  Skin:    General: Skin is warm and dry.  Neurological:     General: No focal deficit present.     Mental Status: She is alert.  Psychiatric:        Mood and Affect: Mood normal.        Behavior: Behavior normal.    Assessment/Plan: Please see individual problem list.  Acute pain of right knee Assessment & Plan: Will get x-ray today in office. Encouraged to alternate ice and heat on painful area. Will add Naproxen 500 mg BID to help with pain. She will stop her Ibuprofen. Encouraged follow up with Pain Management for knee injections.   Orders: -     DG Knee 1-2 Views Right; Future -     Naproxen; Take 1 tablet (500 mg total) by mouth 2 (two) times daily with a meal.  Dispense: 60 tablet; Refill: 2  Type 2 diabetes mellitus without complication, without long-term current use of insulin (HCC) Assessment & Plan: Chronic. Stable. Last A1c- 7.0. Patient remained on Ozempic 0.25 mg weekly and Metformin 500 mg BID. She does not feel that the Ozempic is working, she would like to try something else. Discussed increasing Ozempic, she declined, would like new medication. Will recheck A1c today and start patient on Mounjaro. She will stop the Ozempic. Counseled on common side effects. Will continue to monitor.   Orders: -     Hemoglobin A1c -     Tirzepatide; Inject 2.5 mg into the skin once a week.  Dispense: 2 mL; Refill: 0 -     Tirzepatide; Inject 5 mg into the skin once a week.  Dispense: 2 mL; Refill: 0 -     Tirzepatide; Inject 7.5 mg into the skin once a week.  Dispense: 2 mL; Refill: 0  Body mass index (BMI) of 50-59.9 in adult [facility]) Assessment & Plan: Starting patient on Mounjaro for her type 2 diabetes and  for help with weight loss. Encouraged healthy diet and exercise. Will continue to monitor.   Orders: -     Tirzepatide; Inject 2.5 mg into the skin once a week.  Dispense: 2 mL; Refill: 0 -     Tirzepatide; Inject 5 mg into the skin once a week.  Dispense: 2 mL; Refill: 0 -     Tirzepatide; Inject 7.5 mg into the skin once a week.  Dispense: 2 mL; Refill: 0  Depressive disorder Assessment & Plan:  Chronic. Worsening symptoms. PHQ- 8 today. Denies SI/HI. Will add Wellbutrin XL 150 mg daily. She will continue her Effexor XR 150 mg daily. Counseled on common side effects. Encouraged to contact if worsening symptoms, unusual behavior changes or suicidal thoughts occur.   Orders: -     buPROPion HCl ER (XL); Take 1 tablet (150 mg total) by mouth daily.  Dispense: 90 tablet; Refill: 3  Anxiety Assessment & Plan: Chronic. Stable on Xanax 0.5 mg PRN. Continue. Refills sent. PDMP reviewed. GAD- 9 today.  Orders: -     ALPRAZolam; Take 1 tablet (0.5 mg total) by mouth daily as needed for anxiety.  Dispense: 30 tablet; Refill: 5  Benign essential hypertension Assessment & Plan: Chronic. Stable on Norvasc 10 mg daily, HCTZ 12.5 mg daily and Labetalol 200 mg BID. Continue. Lab work as outlined.   Orders: -     CBC with Differential/Platelet -     Comprehensive metabolic panel  Mixed hyperlipidemia Assessment & Plan: Chronic. Stable on Lipitor 40 mg daily. Continue.      Return in about 6 weeks (around 12/25/2022) for Follow up.   Bethanie Dicker, NP-C [facility]

## 2022-11-13 ENCOUNTER — Ambulatory Visit (INDEPENDENT_AMBULATORY_CARE_PROVIDER_SITE_OTHER): Payer: 59 | Admitting: Nurse Practitioner

## 2022-11-13 ENCOUNTER — Ambulatory Visit (INDEPENDENT_AMBULATORY_CARE_PROVIDER_SITE_OTHER): Payer: 59

## 2022-11-13 ENCOUNTER — Encounter: Payer: Self-pay | Admitting: Nurse Practitioner

## 2022-11-13 VITALS — BP 128/72 | HR 74 | Temp 98.2°F | Ht 65.0 in | Wt 325.2 lb

## 2022-11-13 DIAGNOSIS — M25561 Pain in right knee: Secondary | ICD-10-CM | POA: Diagnosis not present

## 2022-11-13 DIAGNOSIS — E119 Type 2 diabetes mellitus without complications: Secondary | ICD-10-CM

## 2022-11-13 DIAGNOSIS — I10 Essential (primary) hypertension: Secondary | ICD-10-CM | POA: Diagnosis not present

## 2022-11-13 DIAGNOSIS — M25562 Pain in left knee: Secondary | ICD-10-CM | POA: Insufficient documentation

## 2022-11-13 DIAGNOSIS — Z6841 Body Mass Index (BMI) 40.0 and over, adult: Secondary | ICD-10-CM

## 2022-11-13 DIAGNOSIS — F419 Anxiety disorder, unspecified: Secondary | ICD-10-CM

## 2022-11-13 DIAGNOSIS — F32A Depression, unspecified: Secondary | ICD-10-CM

## 2022-11-13 DIAGNOSIS — Z7985 Long-term (current) use of injectable non-insulin antidiabetic drugs: Secondary | ICD-10-CM

## 2022-11-13 DIAGNOSIS — E782 Mixed hyperlipidemia: Secondary | ICD-10-CM

## 2022-11-13 LAB — CBC WITH DIFFERENTIAL/PLATELET
Basophils Absolute: 0.2 10*3/uL — ABNORMAL HIGH (ref 0.0–0.1)
Basophils Relative: 1.7 % (ref 0.0–3.0)
Eosinophils Absolute: 0.8 10*3/uL — ABNORMAL HIGH (ref 0.0–0.7)
Eosinophils Relative: 6.4 % — ABNORMAL HIGH (ref 0.0–5.0)
HCT: 37.9 % (ref 36.0–46.0)
Hemoglobin: 12.3 g/dL (ref 12.0–15.0)
Lymphocytes Relative: 21.6 % (ref 12.0–46.0)
Lymphs Abs: 2.6 10*3/uL (ref 0.7–4.0)
MCHC: 32.5 g/dL (ref 30.0–36.0)
MCV: 91.8 fl (ref 78.0–100.0)
Monocytes Absolute: 0.6 10*3/uL (ref 0.1–1.0)
Monocytes Relative: 5.4 % (ref 3.0–12.0)
Neutro Abs: 7.7 10*3/uL (ref 1.4–7.7)
Neutrophils Relative %: 64.9 % (ref 43.0–77.0)
Platelets: 368 10*3/uL (ref 150.0–400.0)
RBC: 4.13 Mil/uL (ref 3.87–5.11)
RDW: 14.8 % (ref 11.5–15.5)
WBC: 11.9 10*3/uL — ABNORMAL HIGH (ref 4.0–10.5)

## 2022-11-13 LAB — COMPREHENSIVE METABOLIC PANEL
ALT: 14 U/L (ref 0–35)
AST: 15 U/L (ref 0–37)
Albumin: 4 g/dL (ref 3.5–5.2)
Alkaline Phosphatase: 93 U/L (ref 39–117)
BUN: 12 mg/dL (ref 6–23)
CO2: 29 mEq/L (ref 19–32)
Calcium: 9.7 mg/dL (ref 8.4–10.5)
Chloride: 98 mEq/L (ref 96–112)
Creatinine, Ser: 0.7 mg/dL (ref 0.40–1.20)
GFR: 98.37 mL/min (ref >60.00)
Glucose, Bld: 180 mg/dL — ABNORMAL HIGH (ref 70–99)
Potassium: 3.7 mEq/L (ref 3.5–5.1)
Sodium: 137 mEq/L (ref 135–145)
Total Bilirubin: 0.5 mg/dL (ref 0.2–1.2)
Total Protein: 7.4 g/dL (ref 6.0–8.3)

## 2022-11-13 LAB — HEMOGLOBIN A1C: Hgb A1c MFr Bld: 6.8 % — ABNORMAL HIGH (ref 4.6–6.5)

## 2022-11-13 MED ORDER — NAPROXEN 500 MG PO TABS: 500.0000 mg | ORAL_TABLET | Freq: Two times a day (BID) | ORAL | 2 refills | Status: DC

## 2022-11-13 MED ORDER — ALPRAZOLAM 0.5 MG PO TABS
0.5000 mg | ORAL_TABLET | Freq: Every day | ORAL | 5 refills | Status: DC | PRN
Start: 2022-11-13 — End: 2023-05-19

## 2022-11-13 MED ORDER — TIRZEPATIDE 7.5 MG/0.5ML ~~LOC~~ SOAJ: 7.5000 mg | SUBCUTANEOUS | 0 refills | Status: DC

## 2022-11-13 MED ORDER — TIRZEPATIDE 2.5 MG/0.5ML ~~LOC~~ SOAJ
2.5000 mg | SUBCUTANEOUS | 0 refills | Status: DC
Start: 2022-11-13 — End: 2022-12-25

## 2022-11-13 MED ORDER — TIRZEPATIDE 5 MG/0.5ML ~~LOC~~ SOAJ
5.0000 mg | SUBCUTANEOUS | 0 refills | Status: DC
Start: 2022-11-13 — End: 2023-02-11

## 2022-11-13 MED ORDER — BUPROPION HCL ER (XL) 150 MG PO TB24: 150.0000 mg | ORAL_TABLET | Freq: Every day | ORAL | 3 refills | Status: DC

## 2022-11-13 NOTE — Assessment & Plan Note (Signed)
Chronic. Stable on Norvasc 10 mg daily, HCTZ 12.5 mg daily and Labetalol 200 mg BID. Continue. Lab work as outlined.

## 2022-11-13 NOTE — Assessment & Plan Note (Signed)
Will get x-ray today in office. Encouraged to alternate ice and heat on painful area. Will add Naproxen 500 mg BID to help with pain. She will stop her Ibuprofen. Encouraged follow up with Pain Management for knee injections.

## 2022-11-13 NOTE — Assessment & Plan Note (Signed)
Chronic. Stable on Lipitor 40 mg daily. Continue. 

## 2022-11-17 NOTE — Assessment & Plan Note (Signed)
Chronic. Worsening symptoms. PHQ- 8 today. Denies SI/HI. Will add Wellbutrin XL 150 mg daily. She will continue her Effexor XR 150 mg daily. Counseled on common side effects. Encouraged to contact if worsening symptoms, unusual behavior changes or suicidal thoughts occur.

## 2022-11-17 NOTE — Assessment & Plan Note (Addendum)
Chronic. Stable on Xanax 0.5 mg PRN. Continue. Refills sent. PDMP reviewed. GAD- 9 today.

## 2022-11-17 NOTE — Assessment & Plan Note (Signed)
Starting patient on Mounjaro for her type 2 diabetes and for help with weight loss. Encouraged healthy diet and exercise. Will continue to monitor.

## 2022-11-17 NOTE — Assessment & Plan Note (Addendum)
Chronic. Stable. Last A1c- 7.0. Patient remained on Ozempic 0.25 mg weekly and Metformin 500 mg BID. She does not feel that the Ozempic is working, she would like to try something else. Discussed increasing Ozempic, she declined, would like new medication. Will recheck A1c today and start patient on Mounjaro. She will stop the Ozempic. Counseled on common side effects. Will continue to monitor.

## 2022-11-18 ENCOUNTER — Telehealth: Payer: Self-pay | Admitting: Physical Medicine & Rehabilitation

## 2022-11-18 NOTE — Telephone Encounter (Signed)
Patient called in and states she fell and is experiencing a lot of pain on her R knee , patient said she saw her PCP and was sent for xrays , patient is requesting if there is anything she can be prescribed to help with the pain

## 2022-11-26 ENCOUNTER — Encounter: Payer: Self-pay | Admitting: Physical Medicine & Rehabilitation

## 2022-11-26 MED ORDER — OXYCODONE-ACETAMINOPHEN 5-325 MG PO TABS: 1.0000 | ORAL_TABLET | Freq: Two times a day (BID) | ORAL | 0 refills | Status: DC | PRN

## 2022-11-27 ENCOUNTER — Encounter: Payer: Self-pay | Admitting: Physical Medicine & Rehabilitation

## 2022-11-27 ENCOUNTER — Encounter: Payer: 59 | Attending: Physical Medicine & Rehabilitation | Admitting: Physical Medicine & Rehabilitation

## 2022-11-27 VITALS — BP 125/87 | HR 74 | Ht 65.0 in | Wt 325.0 lb

## 2022-11-27 DIAGNOSIS — M62838 Other muscle spasm: Secondary | ICD-10-CM | POA: Diagnosis present

## 2022-11-27 DIAGNOSIS — M25562 Pain in left knee: Secondary | ICD-10-CM | POA: Insufficient documentation

## 2022-11-27 DIAGNOSIS — M170 Bilateral primary osteoarthritis of knee: Secondary | ICD-10-CM | POA: Insufficient documentation

## 2022-11-27 DIAGNOSIS — G894 Chronic pain syndrome: Secondary | ICD-10-CM | POA: Insufficient documentation

## 2022-11-27 DIAGNOSIS — M25561 Pain in right knee: Secondary | ICD-10-CM | POA: Diagnosis not present

## 2022-11-27 DIAGNOSIS — G8929 Other chronic pain: Secondary | ICD-10-CM | POA: Diagnosis present

## 2022-11-27 MED ORDER — LIDOCAINE HCL 1 % IJ SOLN
4.0000 mL | Freq: Once | INTRAMUSCULAR | Status: AC
Start: 2022-11-27 — End: 2022-11-27
  Administered 2022-11-27: 4 mL

## 2022-11-27 MED ORDER — TRIAMCINOLONE ACETONIDE 40 MG/ML IJ SUSP
80.0000 mg | Freq: Once | INTRAMUSCULAR | Status: AC
Start: 2022-11-27 — End: 2022-11-27
  Administered 2022-11-27: 80 mg via INTRAMUSCULAR

## 2022-11-27 NOTE — Progress Notes (Addendum)
Subjective:    Patient ID: Sherry Price, female    DOB: 05/28/1970, 52 y.o.   MRN: [id number]  HPI  HPI   Sherry Price is a 52 y.o. 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   They are presenting to PM&R clinic as a new patient for pain management evaluation. They were referred by Gardiner Rhyme for bilateral knee pain.  Her pain is 7-8 out of 10 worsens with walking.  Patient reports she has had bilateral knee pain for many years.  Pain is worsened with activity.  Patient reports occasional right back pain mostly when ambulating longer distances.  Furthermore she also has pain due to muscle spasms in her left arm.  She thinks this is related to prior treatment of cancer of her left breast.  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  She typically walks without a cane for short distances but uses a walker for longer distances.   She has been seen by [facility] sports medicine.  She has received cortisone shots in her knees with benefit.  Cortisone does help her knee pain bilaterally however she feels like she is still very limited by her pain.  She reports her last knee injections were in March.  She says she had gel injections years ago " rooster comb" and is not sure how these helped.  She did not continue these injections regularly at the time.   She reports previously using oxycodone/Xtampza from [facility].  Patient says she discontinued seeing [facility] because they used a different insurance for 1 visit resulting in a $300 bill.  As she was unable to pay this bill she was unable to continue following with [facility].  She  feels like this provided her improved pain control and she was able to walk a lot further.      Red flag symptoms: No red flags for back pain endorsed in Hx or ROS   Medications tried:   Topical medications  Nsaids- ibuprofen- helps slightly, she is currently using Tylenol  -did not help much in the past Opiates  Oyxoconde and xtamaza -were helping control her pain until he would discontinued several months ago Tramadol- didn't help Hydrocodone- She doesn't recall Gabapentin / Lyrica - Denies use TCAs  - Denies use SNRIs -currently on venlafaxine     Other treatments: Knee braces help PT- few months ago, helped her pain  TENs unit- Denies  Injections- Gel injections didn't help- tried years ago , cortisone helps - last in march  Surgery- limted by wt, taking ozempic    Interval History 11/27/22 Sherry Price is here for bilateral cortisone knee injections.  She reports she has not had cortisone injection to her knees 3 months.  Reports cortisone injections were previously helpful to her pain.  She continues to work on losing weight.  Knee pain continues to be severe and limiting.  Oxycodone ordered this week however she has not picked this up yet.  We discussed EtOH noted on prior UDS, she was not on oxycodone at the time.  She agrees to discontinue use of alcohol since restarting this medication.    Pain Inventory Average Pain 8 Pain Right Now  9 My pain is stabbing and aching  In the last 24 hours, has pain interfered with the following? General activity 1 Relation with others 1 Enjoyment of life 1 What TIME of day is your pain at its worst? morning , daytime, and evening Sleep (in general) Poor  Pain is worse with: walking and standing Pain improves with: medication Relief from Meds: 1  Family History  Problem Relation Age of Onset   Hypertension Mother    Heart disease Mother    Diabetes Mother    Breast cancer Cousin    Social History   Socioeconomic History    Marital status: Single    Spouse name: Not on file   Number of children: Not on file   Years of education: Not on file   Highest education level: Not on file  Occupational History   Not on file  Tobacco Use   Smoking status: Former    Types: Cigarettes   Smokeless tobacco: Never  Vaping Use   Vaping status: Never Used  Substance and Sexual Activity   Alcohol use: Yes    Comment: occasionally   Drug use: No   Sexual activity: Not on file  Other Topics Concern   Not on file  Social History Narrative   Not on file   Social Determinants of Health   Financial Resource Strain: Not on file  Food Insecurity: Not on file  Transportation Needs: Not on file  Physical Activity: Not on file  Stress: Not on file  Social Connections: Not on file   Past Surgical History:  Procedure Laterality Date   BREAST BIOPSY Left 2013   +   BREAST BIOPSY Right    neg   BREAST LUMPECTOMY  2013   BREAST MAMMOSITE Left February 2013   BREAST REDUCTION SURGERY  2005   COLONOSCOPY WITH PROPOFOL N/A 01/09/2015   Procedure: COLONOSCOPY WITH PROPOFOL;  Surgeon: Midge Minium, MD;  Location: [facility] ENDOSCOPY;  Service: Endoscopy;  Laterality: N/A;   ESOPHAGOGASTRODUODENOSCOPY (EGD) WITH PROPOFOL N/A 01/09/2015   Procedure: ESOPHAGOGASTRODUODENOSCOPY (EGD) WITH PROPOFOL;  Surgeon: Midge Minium, MD;  Location: [facility] ENDOSCOPY;  Service: Endoscopy;  Laterality: N/A;   OOPHORECTOMY Right 2013   REDUCTION MAMMAPLASTY     Past Surgical History:  Procedure Laterality Date   BREAST BIOPSY Left 2013   +   BREAST BIOPSY Right    neg   BREAST LUMPECTOMY  2013   BREAST MAMMOSITE Left February 2013   BREAST REDUCTION SURGERY  2005   COLONOSCOPY WITH PROPOFOL N/A 01/09/2015   Procedure: COLONOSCOPY WITH PROPOFOL;  Surgeon: Midge Minium, MD;  Location: [facility] ENDOSCOPY;  Service: Endoscopy;  Laterality: N/A;   ESOPHAGOGASTRODUODENOSCOPY (EGD) WITH PROPOFOL N/A 01/09/2015   Procedure: ESOPHAGOGASTRODUODENOSCOPY (EGD) WITH  PROPOFOL;  Surgeon: Midge Minium, MD;  Location: [facility] ENDOSCOPY;  Service: Endoscopy;  Laterality: N/A;   OOPHORECTOMY Right 2013   REDUCTION MAMMAPLASTY     Past Medical History:  Diagnosis Date   Arthritis    Breast cancer (HCC)    left breast - chemo and radiation   Diabetes mellitus without complication (HCC)    Hypertension    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   Panic attacks    Personal history of chemotherapy    Personal history of radiation therapy    Sleep apnea    BP 125/87   Pulse  74   Ht 5\' 5"  (1.651 m)   Wt (!) 325 lb (147.4 kg)   LMP  (LMP Unknown)   SpO2 97%   BMI 54.08 kg/m   Opioid Risk Score:   Fall Risk Score:  `1  Depression screen [facility] 2/9     11/13/2022    9:04 AM 10/21/2022   10:41 AM 06/24/2022    3:08 PM 05/14/2022   10:48 AM  Depression screen PHQ 2/9  Decreased Interest 1 1 2  0  Down, Depressed, Hopeless 1 1 1  0  PHQ - 2 Score 2 2 3  0  Altered sleeping 1 2 2    Tired, decreased energy 1 3 3    Change in appetite 1 2 1    Feeling bad or failure about yourself  2 1 1    Trouble concentrating 1 1 2    Moving slowly or fidgety/restless 0 0 0   Suicidal thoughts 0  0   PHQ-9 Score 8 11 12    Difficult doing work/chores Very difficult  Somewhat difficult       Review of Systems  Musculoskeletal:  Positive for back pain.       B/L knee pain  All other systems reviewed and are negative.      Objective:   Physical Exam   Gen: no distress, normal appearing, obese HEENT: oral mucosa pink and moist, NCAT Cardio: Reg rate Chest: normal effort, normal rate of breathing Abd: soft, non-distended Ext: no edema Psych: pleasant, normal affect Skin: intact Neuro: Alert and awake, follows commands, cranial nerves II through XII grossly intact, normal speech and language No focal motor or sensory  deficits noted DTR normal and symmetric Musculoskeletal:  Tenderness anterior knee bilaterally right greater than left- unchanged No significant lower back or paraspinal tenderness noted Bilateral slump negative Bilateral knee joint crepitus noted Pain with passive ROM of bilateral knees  R knee xray 11/13/22 IMPRESSION: 1. Severe 3 compartmental osteoarthritis, with trace reactive suprapatellar joint effusion. 2. No acute fracture.     Assessment & Plan:   Bilateral knee OA -Opiate risk tool moderate -Previously on Xtampza and oxycodone as needed.  She indicates her function has significantly worsened since she discontinued use of these medications. -Could consider TENS unit, caution due to history of prior cancer -Advised to request records from [facility]- pending -Restart oxycodone 5 mg twice daily as needed  -UDS and pain agreement prior visit -ETOH on prior UDS- discussed DC and she agrees. This UDS was prior to her starting on oxycodone -Monitor routine pill counts, PDMP -She reports her DM is well-controlled. Discussed monitoring closely after knee injections -Consider retrying gel injections or trying zilrettta injection or referral for genicular nerve blocks if she stops getting good results with cortisone injections -Order knee braces b/l from [employer], order nexwave  Knee injection Indication:b/l Knee pain not relieved by medication management and other conservative care.  Informed consent was obtained after describing risks and benefits of the procedure with the patient, this includes bleeding, bruising, infection and medication side effects. The patient wishes to proceed and has given written consent. The patient was placed in a recumbent position. The lateral aspect of the knee was marked and prepped with Betadine and alcohol. It was then entered with a 25-gauge 1-1/2 inch needle after negative draw back for blood, a solution containing one ML of 40 mg per mL  kenalog and 1 mL of 1% lidocaine were injected. The patient tolerated the procedure well. This was done on both knees.  Post procedure instructions were given.    Muscles spasms L arm  -Improved, continue Magnesium supplementation   Morbid obesity -She is currently on Ozempic -Provided list of medications for chronic pain and weight loss prior visit

## 2022-11-27 NOTE — Telephone Encounter (Signed)
Patient was seen in clinic today.

## 2022-11-28 ENCOUNTER — Telehealth: Payer: Self-pay | Admitting: *Deleted

## 2022-11-28 NOTE — Telephone Encounter (Signed)
Sherry Price (Sherry Price) - [id number] oxyCODONE-Acetaminophen 5-325MG  tablets Status: PA RequestCreated: July 25th, 2024 [phone]

## 2022-11-28 NOTE — Telephone Encounter (Signed)
Sherry Price (KeyCoralyn Mark) - [id number] oxyCODONE-Acetaminophen 5-325MG  tablets Status: PA Response - Approved

## 2022-12-03 ENCOUNTER — Encounter (INDEPENDENT_AMBULATORY_CARE_PROVIDER_SITE_OTHER): Payer: Self-pay

## 2022-12-07 ENCOUNTER — Other Ambulatory Visit: Payer: Self-pay | Admitting: Nurse Practitioner

## 2022-12-07 DIAGNOSIS — M170 Bilateral primary osteoarthritis of knee: Secondary | ICD-10-CM

## 2022-12-08 ENCOUNTER — Ambulatory Visit
Admission: RE | Admit: 2022-12-08 | Discharge: 2022-12-08 | Disposition: A | Discharge status: Home / Self Care | Payer: 59 | Source: Ambulatory Visit | Attending: Nurse Practitioner | Admitting: Nurse Practitioner

## 2022-12-08 DIAGNOSIS — Z1231 Encounter for screening mammogram for malignant neoplasm of breast: Secondary | ICD-10-CM | POA: Diagnosis not present

## 2022-12-12 ENCOUNTER — Inpatient Hospital Stay
Admission: RE | Admit: 2022-12-12 | Discharge: 2022-12-12 | Disposition: A | Discharge status: Home / Self Care | Payer: Self-pay | Source: Ambulatory Visit | Attending: Nurse Practitioner | Admitting: Nurse Practitioner

## 2022-12-12 ENCOUNTER — Other Ambulatory Visit: Payer: Self-pay | Admitting: *Deleted

## 2022-12-12 DIAGNOSIS — Z1231 Encounter for screening mammogram for malignant neoplasm of breast: Secondary | ICD-10-CM

## 2022-12-25 ENCOUNTER — Ambulatory Visit (INDEPENDENT_AMBULATORY_CARE_PROVIDER_SITE_OTHER): Payer: 59 | Admitting: Nurse Practitioner

## 2022-12-25 ENCOUNTER — Encounter: Payer: Self-pay | Admitting: Nurse Practitioner

## 2022-12-25 VITALS — BP 124/82 | HR 72 | Temp 98.5°F | Ht 65.0 in | Wt 323.0 lb

## 2022-12-25 DIAGNOSIS — E119 Type 2 diabetes mellitus without complications: Secondary | ICD-10-CM | POA: Diagnosis not present

## 2022-12-25 DIAGNOSIS — F32A Depression, unspecified: Secondary | ICD-10-CM

## 2022-12-25 DIAGNOSIS — Z7984 Long term (current) use of oral hypoglycemic drugs: Secondary | ICD-10-CM

## 2022-12-25 DIAGNOSIS — Z6841 Body Mass Index (BMI) 40.0 and over, adult: Secondary | ICD-10-CM | POA: Diagnosis not present

## 2022-12-25 DIAGNOSIS — F419 Anxiety disorder, unspecified: Secondary | ICD-10-CM

## 2022-12-25 DIAGNOSIS — Z23 Encounter for immunization: Secondary | ICD-10-CM

## 2022-12-25 DIAGNOSIS — Z7985 Long-term (current) use of injectable non-insulin antidiabetic drugs: Secondary | ICD-10-CM

## 2022-12-25 NOTE — Progress Notes (Signed)
Bethanie Dicker, NP-C Phone: [phone]  Sherry Price is a 52 y.o. female who presents today for follow up.   Anxiety/Depression- Patient started on Wellbutrin XL 150 mg daily last month. Continues to have fluctuations in mood, ups and downs. She does not feel that she is where she would like to be. She has an appointment scheduled with Psychiatry in October. She continues to take her Effexor XR 150 mg daily also. She would like to stay on her current medications for now. Denies SI/HI.   DIABETES Disease Monitoring: Blood Sugar ranges- Not checking Polyuria/phagia/dipsia- No      Optho- Yes Medications: Compliance- Metformin and Mounjaro Hypoglycemic symptoms- No Lab Results  Component Value Date   HGBA1C 6.8 (H) 11/13/2022   Obesity- Patient started on Mounjaro last month. She has completed the 2.5 mg weekly dosing and has increased to 5 mg weekly. Denies any side effects. She has lost 2 pounds. She has noticed a decrease in her appetite. She has been decreasing her carb intake, eating less bread and pasta. She has also found that she is snacking less. She is trying to be more active.   Social History   Tobacco Use  Smoking Status Former   Types: Cigarettes  Smokeless Tobacco Never    Current Outpatient Medications on File Prior to Visit  Medication Sig Dispense Refill   ALPRAZolam (XANAX) 0.5 MG tablet Take 1 tablet (0.5 mg total) by mouth daily as needed for anxiety. 30 tablet 5   amLODipine (NORVASC) 10 MG tablet Take 1 tablet (10 mg total) by mouth daily. 90 tablet 3   Aspirin-Acetaminophen-Caffeine (GOODY HEADACHE PO) Take 1 packet by mouth as needed.     atorvastatin (LIPITOR) 40 MG tablet Take 40 mg by mouth daily.     buPROPion (WELLBUTRIN XL) 150 MG 24 hr tablet Take 1 tablet (150 mg total) by mouth daily. 90 tablet 3   calcium carbonate (TUMS) 500 MG chewable tablet Chew 1 tablet (200 mg of elemental calcium total) by mouth 2 (two) times daily. 20 tablet 0    chlorhexidine (PERIDEX) 0.12 % solution SMARTSIG:By Mouth     hydrochlorothiazide (HYDRODIURIL) 25 MG tablet Take 0.5 tablets (12.5 mg total) by mouth daily. 45 tablet 3   labetalol (NORMODYNE) 200 MG tablet Take 1 tablet (200 mg total) by mouth 2 (two) times daily. 180 tablet 3   metFORMIN (GLUCOPHAGE) 500 MG tablet Take 1 tablet (500 mg total) by mouth 2 (two) times daily with a meal. 180 tablet 3   naproxen (NAPROSYN) 500 MG tablet Take 1 tablet (500 mg total) by mouth 2 (two) times daily with a meal. 60 tablet 2   ondansetron (ZOFRAN-ODT) 4 MG disintegrating tablet DISSOLVE 1 TO 2 TABLETS ON THE TONGUE EVERY 8 HOURS AS NEEDED FOR NAUSEA OR VOMITING 30 tablet 2   oxyCODONE-acetaminophen (PERCOCET) 5-325 MG tablet Take 1 tablet by mouth every 12 (twelve) hours as needed for severe pain. 60 tablet 0   tirzepatide (MOUNJARO) 5 MG/0.5ML Pen Inject 5 mg into the skin once a week. 2 mL 0   tirzepatide (MOUNJARO) 7.5 MG/0.5ML Pen Inject 7.5 mg into the skin once a week. 2 mL 0   venlafaxine XR (EFFEXOR-XR) 150 MG 24 hr capsule Take 1 capsule (150 mg total) by mouth daily with breakfast. 90 capsule 3   No current facility-administered medications on file prior to visit.    ROS see history of present illness  Objective  Physical Exam Vitals:   12/25/22 3875  BP: 124/82  Pulse: 72  Temp: 98.5 F (36.9 C)  SpO2: 97%    BP Readings from Last 3 Encounters:  12/25/22 124/82  11/27/22 125/87  11/13/22 128/72   Wt Readings from Last 3 Encounters:  12/25/22 (!) 323 lb (146.5 kg)  11/27/22 (!) 325 lb (147.4 kg)  11/13/22 (!) 325 lb 3.2 oz (147.5 kg)    Physical Exam Constitutional:      General: She is not in acute distress.    Appearance: Normal appearance. She is obese.  HENT:     Head: Normocephalic.  Cardiovascular:     Rate and Rhythm: Normal rate and regular rhythm.     Heart sounds: Normal heart sounds.  Pulmonary:     Effort: Pulmonary effort is normal.     Breath sounds:  Normal breath sounds.  Skin:    General: Skin is warm and dry.  Neurological:     General: No focal deficit present.     Mental Status: She is alert.  Psychiatric:        Mood and Affect: Mood normal.        Behavior: Behavior normal.    Assessment/Plan: Please see individual problem list.  Type 2 diabetes mellitus without complication, without long-term current use of insulin (HCC) Assessment & Plan: Chronic. Stable on Metformin and Mounjaro. Continue. Last A1c- 6.8. Encouraged to continue working on healthy diet and being active. Will continue to monitor.   Body mass index (BMI) of 50-59.9 in adult [facility]) Assessment & Plan: Started on Mounjaro last month. Down 2 pounds. Completed 2.5 mg weekly, has increased to 5 mg weekly. She will remain at this dose for 4 weeks then increase to 7.5 mg weekly x 4 weeks. Denies side effects. Encouraged to continue working on healthy diet and remaining active. Will continue to monitor.    Depressive disorder Assessment & Plan: Chronic. Continues to have mood fluctuations. Scheduled to see Psychiatry in October. She will continue Wellbutrin XL 150 mg daily and Effexor XR 150 mg daily. Denies SI/HI. Encouraged to contact if worsening symptoms, unusual behavior changes or suicidal thoughts occur. Will continue to monitor.    Anxiety Assessment & Plan: Chronic. Stable on Xanax, Wellbutrin XL and Effexor XR. Continue. Follow up with Psychiatry as scheduled in October.    Need for Tdap vaccination -     Tdap vaccine greater than or equal to 7yo IM  Need for shingles vaccine -     Varicella-zoster vaccine IM   Return in about 3 months (around 03/27/2023) for Follow up.   Bethanie Dicker, NP-C [facility] - [employer]

## 2022-12-31 ENCOUNTER — Encounter: Payer: Self-pay | Admitting: Nurse Practitioner

## 2022-12-31 ENCOUNTER — Telehealth: Payer: Self-pay

## 2022-12-31 MED ORDER — OXYCODONE-ACETAMINOPHEN 5-325 MG PO TABS: 1.0000 | ORAL_TABLET | Freq: Two times a day (BID) | ORAL | 0 refills | Status: DC | PRN

## 2022-12-31 NOTE — Assessment & Plan Note (Signed)
Chronic. Stable on Xanax, Wellbutrin XL and Effexor XR. Continue. Follow up with Psychiatry as scheduled in October.

## 2022-12-31 NOTE — Assessment & Plan Note (Signed)
Chronic. Continues to have mood fluctuations. Scheduled to see Psychiatry in October. She will continue Wellbutrin XL 150 mg daily and Effexor XR 150 mg daily. Denies SI/HI. Encouraged to contact if worsening symptoms, unusual behavior changes or suicidal thoughts occur. Will continue to monitor.

## 2022-12-31 NOTE — Addendum Note (Signed)
Addended by: Fanny Dance on: 12/31/2022 08:16 PM   Modules accepted: Orders

## 2022-12-31 NOTE — Telephone Encounter (Addendum)
A message was left to call Berneta Maddocks on [phone] for a medication refill. When I called the number back I am not able to leave a message. There is no voice ID and the phone number does is not attached to the chart.   Message left for a call back. Please confirm phone contact when she calls back.   Patient called back. Please send RX to [facility] on [address] in [place].

## 2022-12-31 NOTE — Assessment & Plan Note (Signed)
Chronic. Stable on Metformin and Mounjaro. Continue. Last A1c- 6.8. Encouraged to continue working on healthy diet and being active. Will continue to monitor.

## 2022-12-31 NOTE — Assessment & Plan Note (Addendum)
Started on Roff last month. Down 2 pounds. Completed 2.5 mg weekly, has increased to 5 mg weekly. She will remain at this dose for 4 weeks then increase to 7.5 mg weekly x 4 weeks. Denies side effects. Encouraged to continue working on healthy diet and remaining active. Will continue to monitor.

## 2023-01-29 ENCOUNTER — Encounter: Payer: Self-pay | Admitting: Physical Medicine & Rehabilitation

## 2023-01-29 ENCOUNTER — Encounter: Payer: 59 | Attending: Physical Medicine & Rehabilitation | Admitting: Physical Medicine & Rehabilitation

## 2023-01-29 VITALS — BP 137/85 | HR 82 | Ht 65.0 in | Wt 318.0 lb

## 2023-01-29 DIAGNOSIS — Z79891 Long term (current) use of opiate analgesic: Secondary | ICD-10-CM | POA: Diagnosis present

## 2023-01-29 DIAGNOSIS — M170 Bilateral primary osteoarthritis of knee: Secondary | ICD-10-CM | POA: Diagnosis present

## 2023-01-29 DIAGNOSIS — G894 Chronic pain syndrome: Secondary | ICD-10-CM | POA: Diagnosis not present

## 2023-01-29 MED ORDER — OXYCODONE-ACETAMINOPHEN 5-325 MG PO TABS: 1.0000 | ORAL_TABLET | Freq: Three times a day (TID) | ORAL | 0 refills | Status: DC | PRN

## 2023-01-29 NOTE — Progress Notes (Signed)
Subjective:    Patient ID: Sherry Price, female    DOB: Mar 15, 1971, 52 y.o.   MRN: [id number]  HPI  Sherry Price is a 52 y.o. 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   They are presenting to PM&R clinic as a new patient for pain management evaluation. They were referred by Gardiner Rhyme for bilateral knee pain.  Her pain is 7-8 out of 10 worsens with walking.  Patient reports she has had bilateral knee pain for many years.  Pain is worsened with activity.  Patient reports occasional right back pain mostly when ambulating longer distances.  Furthermore she also has pain due to muscle spasms in her left arm.  She thinks this is related to prior treatment of cancer of her left breast.  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  She typically walks without a cane for short distances but uses a walker for longer distances.   She has been seen by [facility] sports medicine.  She has received cortisone shots in her knees with benefit.  Cortisone does help her knee pain bilaterally however she feels like she is still very limited by her pain.  She reports her last knee injections were in March.  She says she had gel injections years ago " rooster comb" and is not sure how these helped.  She did not continue these injections regularly at the time.   She reports previously using oxycodone/Xtampza from [facility].  Patient says she discontinued seeing [facility] because they used a different insurance for 1 visit resulting in a $300 bill.  As she was unable to pay this bill she was unable to continue following with [facility] medical.  She feels like  this provided her improved pain control and she was able to walk a lot further.      Red flag symptoms: No red flags for back pain endorsed in Hx or ROS   Medications tried:   Topical medications  Nsaids- ibuprofen- helps slightly, she is currently using Tylenol  -did not help much in the past Opiates  Oyxoconde and xtamaza -were helping control her pain until he would discontinued several months ago Tramadol- didn't help Hydrocodone- She doesn't recall Gabapentin / Lyrica - Denies use TCAs  - Denies use SNRIs -currently on venlafaxine     Other treatments: Knee braces help PT- few months ago, helped her pain  TENs unit- Denies  Injections- Gel injections? Vs cortisone didn't help- tried years ago , cortisone helps - last in march  Surgery- limted by wt, taking ozempic      Interval History 11/27/22 Sherry Price is here for bilateral cortisone knee injections.  She reports she has not had cortisone injection to her knees 3 months.  Reports cortisone injections were previously helpful to her pain.  She continues to work on losing weight.  Knee pain continues to be severe and limiting.  Oxycodone ordered this week however she has not picked this up yet.  We discussed EtOH noted on prior UDS, she was not on oxycodone at the time.  She agrees to discontinue use of alcohol since restarting this medication.   Interval History 01/29/23 Sherry Price is here for  follow-up regarding her chronic knee pain.  Reports that knee brace and TENS unit are providing some benefit.  She went on a cruise earlier this month had a lot of increased pain due to activities.  She feels that she cannot do as much as she would like to do due to her knee pain.  Oxycodone is helping but not lasting long enough.  Knee cortisone injection helped but did not last long enough.  She feels like these lasted just a few weeks.   Pain Inventory Average Pain 9 Pain Right Now 9 My pain is stabbing and aching  In the last 24  hours, has pain interfered with the following? General activity 3 Relation with others 3 Enjoyment of life 3 What TIME of day is your pain at its worst? morning , daytime, evening, and night Sleep (in general) Fair  Pain is worse with: walking, bending, sitting, and standing Pain improves with: rest, heat/ice, and injections Relief from Meds: 6  Family History  Problem Relation Age of Onset   Hypertension Mother    Heart disease Mother    Diabetes Mother    Breast cancer Cousin    Social History   Socioeconomic History   Marital status: Single    Spouse name: Not on file   Number of children: Not on file   Years of education: Not on file   Highest education level: Not on file  Occupational History   Not on file  Tobacco Use   Smoking status: Former    Types: Cigarettes   Smokeless tobacco: Never  Vaping Use   Vaping status: Never Used  Substance and Sexual Activity   Alcohol use: Yes    Comment: occasionally   Drug use: No   Sexual activity: Not on file  Other Topics Concern   Not on file  Social History Narrative   Not on file   Social Determinants of Health   Financial Resource Strain: Not on file  Food Insecurity: Not on file  Transportation Needs: Not on file  Physical Activity: Not on file  Stress: Not on file  Social Connections: Not on file   Past Surgical History:  Procedure Laterality Date   BREAST BIOPSY Left 2013   +   BREAST BIOPSY Right    neg   BREAST LUMPECTOMY  2013   BREAST MAMMOSITE Left February 2013   BREAST REDUCTION SURGERY  2005   COLONOSCOPY WITH PROPOFOL N/A 01/09/2015   Procedure: COLONOSCOPY WITH PROPOFOL;  Surgeon: Midge Minium, MD;  Location: [facility] ENDOSCOPY;  Service: Endoscopy;  Laterality: N/A;   ESOPHAGOGASTRODUODENOSCOPY (EGD) WITH PROPOFOL N/A 01/09/2015   Procedure: ESOPHAGOGASTRODUODENOSCOPY (EGD) WITH PROPOFOL;  Surgeon: Midge Minium, MD;  Location: [facility] ENDOSCOPY;  Service: Endoscopy;  Laterality: N/A;   OOPHORECTOMY  Right 2013   REDUCTION MAMMAPLASTY     Past Surgical History:  Procedure Laterality Date   BREAST BIOPSY Left 2013   +   BREAST BIOPSY Right    neg   BREAST LUMPECTOMY  2013   BREAST MAMMOSITE Left February 2013   BREAST REDUCTION SURGERY  2005   COLONOSCOPY WITH PROPOFOL N/A 01/09/2015   Procedure: COLONOSCOPY WITH PROPOFOL;  Surgeon: Midge Minium, MD;  Location: [facility] ENDOSCOPY;  Service: Endoscopy;  Laterality: N/A;   ESOPHAGOGASTRODUODENOSCOPY (EGD) WITH PROPOFOL N/A 01/09/2015   Procedure: ESOPHAGOGASTRODUODENOSCOPY (EGD) WITH PROPOFOL;  Surgeon: Midge Minium, MD;  Location: [facility] ENDOSCOPY;  Service: Endoscopy;  Laterality: N/A;   OOPHORECTOMY Right 2013   REDUCTION MAMMAPLASTY  Past Medical History:  Diagnosis Date   Arthritis    Breast cancer (HCC)    left breast - chemo and radiation   Diabetes mellitus without complication (HCC)    Hypertension    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   Panic attacks    Personal history of chemotherapy    Personal history of radiation therapy    Sleep apnea    BP 137/85   Pulse 82   Ht 5\' 5"  (1.651 m)   Wt (!) 318 lb (144.2 kg)   LMP  (LMP Unknown)   SpO2 96%   BMI 52.92 kg/m   Opioid Risk Score:   Fall Risk Score:  `1  Depression screen [facility] 2/9     01/29/2023    1:03 PM 11/27/2022    1:15 PM 11/13/2022    9:04 AM 10/21/2022   10:41 AM 06/24/2022    3:08 PM 05/14/2022   10:48 AM  Depression screen PHQ 2/9  Decreased Interest 0 2 1 1 2  0  Down, Depressed, Hopeless 0 1 1 1 1  0  PHQ - 2 Score 0 3 2 2 3  0  Altered sleeping   1 2 2    Tired, decreased energy   1 3 3    Change in appetite   1 2 1    Feeling bad or failure about yourself    2 1 1    Trouble concentrating   1 1 2    Moving slowly or fidgety/restless   0 0 0   Suicidal thoughts   0  0   PHQ-9 Score   8 11 12    Difficult  doing work/chores   Very difficult  Somewhat difficult       Review of Systems  Musculoskeletal:  Positive for back pain and gait problem.  All other systems reviewed and are negative.     Objective:   Physical Exam     Gen: no distress, normal appearing, obese HEENT: oral mucosa pink and moist, NCAT Chest: normal effort, normal rate of breathing Abd: soft, non-distended Psych: pleasant, normal affect Skin: intact Neuro: Alert and awake, follows commands, cranial nerves II through XII grossly intact, normal speech and language No focal motor or sensory deficits noted Musculoskeletal:  Tenderness anterior knee bilaterally right greater than left-unchanged today also No significant lower back or paraspinal tenderness noted Bilateral slump negative Bilateral knee joint crepitus noted with ROM Pain with passive ROM of bilateral knees Antalgic gait      R knee xray 11/13/22 IMPRESSION: 1. Severe 3 compartmental osteoarthritis, with trace reactive suprapatellar joint effusion. 2. No acute fracture.    Assessment & Plan:  Bilateral knee OA -Opiate risk tool moderate -Previously on Xtampza and oxycodone as needed at different pain clinic -Advised to request records from [facility]- pending -UDS and pain agreement prior visit -ETOH on prior UDS- discussed DC and she agrees. This UDS was prior to her starting on oxycodone -Monitor routine pill counts, PDMP -Patient is out of oxycodone with refill due in a couple days.  She says she took a few extra pills when she was on the cruise due to the increased pain.  Provided verbal warning to not take medication more than directed, discussed that continuing to do so would result in discontinuation of opioid therapy. -Cortisone injection last visit only lasted a few weeks.  She previously reported having gel injections in the past however by description I am wondering if these were cortisone injections also. -Will schedule  for Zilretta next visit at 77-month from her prior injection -Could consider gel injection for referral for genicular nerve block -She reports benefit with knee brace and TENS unit -Increase Percocet to 5 mg 3 times daily as needed      Muscles spasms L arm  -Improved, continue Magnesium supplementation   Morbid obesity -She is currently on Ozempic -Reports she is losing weight -Provided list of medications for chronic pain and weight loss prior visit

## 2023-02-09 ENCOUNTER — Telehealth: Payer: Self-pay | Admitting: Nurse Practitioner

## 2023-02-09 ENCOUNTER — Other Ambulatory Visit: Payer: Self-pay | Admitting: Nurse Practitioner

## 2023-02-09 DIAGNOSIS — E119 Type 2 diabetes mellitus without complications: Secondary | ICD-10-CM

## 2023-02-09 DIAGNOSIS — Z6841 Body Mass Index (BMI) 40.0 and over, adult: Secondary | ICD-10-CM

## 2023-02-09 NOTE — Telephone Encounter (Signed)
Copied from CRM [id number]. Topic: Medicare AWV >> Feb 09, 2023 11:24 AM Payton Doughty wrote: Reason for CRM: Called LVM 02/09/2023 to schedule AWV   Verlee Rossetti; Care Guide Ambulatory Clinical Support [place] [facility] Direct Dial: [phone]

## 2023-02-11 MED ORDER — TIRZEPATIDE 10 MG/0.5ML ~~LOC~~ SOAJ: 10.0000 mg | SUBCUTANEOUS | 0 refills | Status: DC

## 2023-02-11 NOTE — Telephone Encounter (Signed)
Prescription Request  02/11/2023  LOV: 12/25/2022  What is the name of the medication or equipment? [email or web address]  Have you contacted your pharmacy to request a refill? No   Which pharmacy would you like this sent to?  [id number] [place], [place] - [address] AT [facility] [address] [place] [address] Phone: [phone] Fax: [phone]    Patient notified that their request is being sent to the clinical staff for review and that they should receive a response within 2 business days.   Please advise at Mobile [phone] (mobile)

## 2023-02-11 NOTE — Telephone Encounter (Signed)
Called and confirmed pt is taking the 7.5 mg currently and asked if she would like to stay on the 7.5 mg or increase to the 10 mg pt verbalized she would like to increase
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
## 2023-02-20 ENCOUNTER — Telehealth (INDEPENDENT_AMBULATORY_CARE_PROVIDER_SITE_OTHER): Payer: 59 | Admitting: Psychiatry

## 2023-02-20 ENCOUNTER — Encounter: Payer: Self-pay | Admitting: Psychiatry

## 2023-02-20 DIAGNOSIS — F4310 Post-traumatic stress disorder, unspecified: Secondary | ICD-10-CM | POA: Diagnosis not present

## 2023-02-20 DIAGNOSIS — F331 Major depressive disorder, recurrent, moderate: Secondary | ICD-10-CM

## 2023-02-20 DIAGNOSIS — G4700 Insomnia, unspecified: Secondary | ICD-10-CM | POA: Diagnosis not present

## 2023-02-20 MED ORDER — VENLAFAXINE HCL ER 75 MG PO CP24: 75.0000 mg | ORAL_CAPSULE | Freq: Every day | ORAL | 1 refills | Status: DC

## 2023-02-27 ENCOUNTER — Encounter: Payer: Self-pay | Admitting: Physical Medicine & Rehabilitation

## 2023-02-27 ENCOUNTER — Encounter: Payer: 59 | Attending: Physical Medicine & Rehabilitation | Admitting: Physical Medicine & Rehabilitation

## 2023-02-27 VITALS — BP 120/88 | HR 76 | Ht 65.0 in | Wt 312.0 lb

## 2023-02-27 DIAGNOSIS — G8929 Other chronic pain: Secondary | ICD-10-CM | POA: Diagnosis not present

## 2023-02-27 DIAGNOSIS — M25561 Pain in right knee: Secondary | ICD-10-CM | POA: Diagnosis not present

## 2023-02-27 DIAGNOSIS — M25562 Pain in left knee: Secondary | ICD-10-CM | POA: Diagnosis not present

## 2023-02-27 MED ORDER — OXYCODONE-ACETAMINOPHEN 5-325 MG PO TABS: 1.0000 | ORAL_TABLET | Freq: Three times a day (TID) | ORAL | 0 refills | Status: DC | PRN

## 2023-02-27 MED ORDER — TRIAMCINOLONE ACETONIDE 32 MG IX SRER
64.0000 mg | Freq: Once | INTRA_ARTICULAR | Status: AC
Start: 2023-02-27 — End: 2023-02-27
  Administered 2023-02-27: 64 mg via INTRA_ARTICULAR

## 2023-02-27 NOTE — Progress Notes (Signed)
Subjective:    Patient ID: Sherry Price, female    DOB: December 12, 1970, 52 y.o.   MRN: [id number]  HPI  Sherry Price is a 52 y.o. 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   They are presenting to PM&R clinic as a new patient for pain management evaluation. They were referred by Gardiner Rhyme for bilateral knee pain.  Her pain is 7-8 out of 10 worsens with walking.  Patient reports she has had bilateral knee pain for many years.  Pain is worsened with activity.  Patient reports occasional right back pain mostly when ambulating longer distances.  Furthermore she also has pain due to muscle spasms in her left arm.  She thinks this is related to prior treatment of cancer of her left breast.  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  She typically walks without a cane for short distances but uses a walker for longer distances.   She has been seen by [facility] sports medicine.  She has received cortisone shots in her knees with benefit.  Cortisone does help her knee pain bilaterally however she feels like she is still very limited by her pain.  She reports her last knee injections were in March.  She says she had gel injections years ago " rooster comb" and is not sure how these helped.  She did not continue these injections regularly at the time.   She reports previously using oxycodone/Xtampza from [facility].  Patient says she discontinued seeing [facility] because they used a different insurance for 1 visit resulting in a $300 bill.  As she was unable to pay this bill she was unable to continue following with [facility] medical.  She feels like  this provided her improved pain control and she was able to walk a lot further.      Red flag symptoms: No red flags for back pain endorsed in Hx or ROS   Medications tried:   Topical medications  Nsaids- ibuprofen- helps slightly, she is currently using Tylenol  -did not help much in the past Opiates  Oyxoconde and xtamaza -were helping control her pain until he would discontinued several months ago Tramadol- didn't help Hydrocodone- She doesn't recall Gabapentin / Lyrica - Denies use TCAs  - Denies use SNRIs -currently on venlafaxine     Other treatments: Knee braces help Sherry Price- few months ago, helped her pain  TENs unit- Denies  Injections- Gel injections? Vs cortisone didn't help- tried years ago , cortisone helps - last in march  Surgery- limted by wt, taking ozempic      Interval History 11/27/22 Sherry Price is here for bilateral cortisone knee injections.  She reports she has not had cortisone injection to her knees 3 months.  Reports cortisone injections were previously helpful to her pain.  She continues to work on losing weight.  Knee pain continues to be severe and limiting.  Oxycodone ordered this week however she has not picked this up yet.  We discussed EtOH noted on prior UDS, she was not on oxycodone at the time.  She agrees to discontinue use of alcohol since restarting this medication.   Interval History 01/29/23 Sherry Price is here for  follow-up regarding her chronic knee pain.  Reports that knee brace and TENS unit are providing some benefit.  She went on a cruise earlier this month had a lot of increased pain due to activities.  She feels that she cannot do as much as she would like to do due to her knee pain.  Oxycodone is helping but not lasting long enough.  Knee cortisone injection helped but did not last long enough.  She feels like these lasted just a few weeks.  Interval History 02/27/23 Sherry Price is here for b/l zilretta injections today.  It has been 3 months since  her last cortisone knee injections.  She reports her knees continue to cause significant pain and limiting her function.  Percocet 5 mg 3 times daily does help reduce her pain and keep her more comfortable.  She is not having any side effects of this medication.   Pain Inventory Average Pain 9 Pain Right Now 9 My pain is stabbing and aching  In the last 24 hours, has pain interfered with the following? General activity 3 Relation with others 3 Enjoyment of life 3 What TIME of day is your pain at its worst? morning , daytime, evening, and night Sleep (in general) Fair  Pain is worse with: walking, bending, sitting, and standing Pain improves with: rest, heat/ice, and injections Relief from Meds: 6  Family History  Problem Relation Age of Onset   Depression Mother    Hypertension Mother    Heart disease Mother    Diabetes Mother    Alcohol abuse Father    Breast cancer Cousin    Social History   Socioeconomic History   Marital status: Single    Spouse name: Not on file   Number of children: Not on file   Years of education: Not on file   Highest education level: Not on file  Occupational History   Not on file  Tobacco Use   Smoking status: Former    Types: Cigarettes   Smokeless tobacco: Never  Vaping Use   Vaping status: Never Used  Substance and Sexual Activity   Alcohol use: Yes    Comment: occasionally   Drug use: No   Sexual activity: Not on file  Other Topics Concern   Not on file  Social History Narrative   Not on file   Social Determinants of Health   Financial Resource Strain: Not on file  Food Insecurity: Not on file  Transportation Needs: Not on file  Physical Activity: Not on file  Stress: Not on file  Social Connections: Not on file   Past Surgical History:  Procedure Laterality Date   BREAST BIOPSY Left 2013   +   BREAST BIOPSY Right    neg   BREAST LUMPECTOMY  2013   BREAST MAMMOSITE Left February 2013   BREAST REDUCTION SURGERY   2005   COLONOSCOPY WITH PROPOFOL N/A 01/09/2015   Procedure: COLONOSCOPY WITH PROPOFOL;  Surgeon: Midge Minium, MD;  Location: [facility] ENDOSCOPY;  Service: Endoscopy;  Laterality: N/A;   ESOPHAGOGASTRODUODENOSCOPY (EGD) WITH PROPOFOL N/A 01/09/2015   Procedure: ESOPHAGOGASTRODUODENOSCOPY (EGD) WITH PROPOFOL;  Surgeon: Midge Minium, MD;  Location: [facility] ENDOSCOPY;  Service: Endoscopy;  Laterality: N/A;   OOPHORECTOMY Right 2013   REDUCTION MAMMAPLASTY     Past Surgical History:  Procedure Laterality Date   BREAST BIOPSY Left 2013   +   BREAST BIOPSY Right    neg   BREAST LUMPECTOMY  2013   BREAST MAMMOSITE Left February  2013   BREAST REDUCTION SURGERY  2005   COLONOSCOPY WITH PROPOFOL N/A 01/09/2015   Procedure: COLONOSCOPY WITH PROPOFOL;  Surgeon: Midge Minium, MD;  Location: [facility] ENDOSCOPY;  Service: Endoscopy;  Laterality: N/A;   ESOPHAGOGASTRODUODENOSCOPY (EGD) WITH PROPOFOL N/A 01/09/2015   Procedure: ESOPHAGOGASTRODUODENOSCOPY (EGD) WITH PROPOFOL;  Surgeon: Midge Minium, MD;  Location: [facility] ENDOSCOPY;  Service: Endoscopy;  Laterality: N/A;   OOPHORECTOMY Right 2013   REDUCTION MAMMAPLASTY     Past Medical History:  Diagnosis Date   Arthritis    Breast cancer (HCC)    left breast - chemo and radiation   Diabetes mellitus without complication (HCC)    Hypertension    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   Panic attacks    Personal history of chemotherapy    Personal history of radiation therapy    Sleep apnea    BP 120/88   Pulse 76   Ht 5\' 5"  (1.651 m)   Wt (!) 312 lb (141.5 kg)   LMP  (LMP Unknown)   SpO2 95%   BMI 51.92 kg/m   Opioid Risk Score:   Fall Risk Score:  `1  Depression screen [facility] 2/9     02/27/2023   11:02 AM 01/29/2023    1:03 PM 11/27/2022    1:15 PM 11/13/2022    9:04 AM 10/21/2022   10:41 AM 06/24/2022    3:08 PM  05/14/2022   10:48 AM  Depression screen PHQ 2/9  Decreased Interest 0 0 2 1 1 2  0  Down, Depressed, Hopeless 0 0 1 1 1 1  0  PHQ - 2 Score 0 0 3 2 2 3  0  Altered sleeping    1 2 2    Tired, decreased energy    1 3 3    Change in appetite    1 2 1    Feeling bad or failure about yourself     2 1 1    Trouble concentrating    1 1 2    Moving slowly or fidgety/restless    0 0 0   Suicidal thoughts    0  0   PHQ-9 Score    8 11 12    Difficult doing work/chores    Very difficult  Somewhat difficult       Review of Systems  Musculoskeletal:  Positive for back pain and gait problem.  All other systems reviewed and are negative.      Objective:   Physical Exam     Gen: no distress, normal appearing, obese HEENT: oral mucosa pink and moist, NCAT Chest: normal effort, normal rate of breathing Abd: soft, non-distended Psych: pleasant, normal affect Skin: intact Neuro: Alert and awake, follows commands, cranial nerves II through XII grossly intact, normal speech and language No focal motor or sensory deficits noted Musculoskeletal:  Tenderness anterior knee bilaterally  No significant lower back or paraspinal tenderness noted Bilateral slump test negative Bilateral knee joint crepitus noted with ROM Pain with passive ROM of bilateral knees Antalgic gait No knee joint redness or sign of infection noted       R knee xray 11/13/22 IMPRESSION: 1. Severe 3 compartmental osteoarthritis, with trace reactive suprapatellar joint effusion. 2. No acute fracture.    Assessment & Plan:  Bilateral knee OA -Opiate risk tool moderate -Previously on Xtampza and oxycodone as needed at different pain clinic -Advised  to request records from [facility]- pending -UDS and pain agreement prior visit -ETOH on prior UDS- discussed DC and she agrees. This UDS was prior to her starting on oxycodone -Monitor routine pill counts, PDMP -Patient was out of oxycodone early last visit, Pill  counts consistent today 2 tabs left  -Cortisone injection last visit only lasted a few weeks.  She previously reported having gel injections in the past however by description I am wondering if these were cortisone injections. -Could consider gel injection for referral for genicular nerve block -She reports benefit with knee brace and TENS unit -Continue Percocet  5 mg 3 times daily as needed   Knee injection Indication:b/l Knee pain not relieved by medication management and other conservative care.   Informed consent was obtained after describing risks and benefits of the procedure with the patient, this includes bleeding, bruising, infection and medication side effects. The patient wishes to proceed and has given written consent. The patient was placed in a recumbent position. The lateral aspect of the knee was marked and prepped with Betadine and alcohol. It was then entered with a 25-gauge 1-1/2 inch needle after negative draw back for blood, a solution containing Ziltretta was injected. The patient tolerated the procedure well. This was done on both knees. Post procedure instructions were given.   Muscles spasms L arm  -Improved, continue Magnesium supplementation   Morbid obesity -She is currently on Ozempic -Reports she is losing weight -Provided list of medications for chronic pain and weight loss prior visit

## 2023-03-10 ENCOUNTER — Other Ambulatory Visit: Payer: Self-pay | Admitting: Nurse Practitioner

## 2023-03-10 DIAGNOSIS — E119 Type 2 diabetes mellitus without complications: Secondary | ICD-10-CM

## 2023-03-11 ENCOUNTER — Ambulatory Visit: Payer: 59 | Admitting: *Deleted

## 2023-03-11 VITALS — Ht 65.0 in | Wt 315.0 lb

## 2023-03-11 DIAGNOSIS — Z0000 Encounter for general adult medical examination without abnormal findings: Secondary | ICD-10-CM

## 2023-03-11 NOTE — Patient Instructions (Signed)
Sherry Price , Thank you for taking time to come for your Medicare Wellness Visit. I appreciate your ongoing commitment to your health goals. Please review the following plan we discussed and let me know if I can assist you in the future.   Referrals/Orders/Follow-Ups/Clinician Recommendations: Remember to contact your insurance company and find out who takes your insurance so that you can get a diabetic eye exam.  Managing Pain Without Opioids Opioids are strong medicines used to treat moderate to severe pain. For some people, especially those who have long-term (chronic) pain, opioids may not be the best choice for pain management due to: Side effects like nausea, constipation, and sleepiness. The risk of addiction (opioid use disorder). The longer you take opioids, the greater your risk of addiction. Pain that lasts for more than 3 months is called chronic pain. Managing chronic pain usually requires more than one approach and is often provided by a team of health care providers working together (multidisciplinary approach). Pain management may be done at a pain management center or pain clinic. How to manage pain without the use of opioids Use non-opioid medicines Non-opioid medicines for pain may include: Over-the-counter or prescription non-steroidal anti-inflammatory drugs (NSAIDs). These may be the first medicines used for pain. They work well for muscle and bone pain, and they reduce swelling. Acetaminophen. This over-the-counter medicine may work well for milder pain but not swelling. Antidepressants. These may be used to treat chronic pain. A certain type of antidepressant (tricyclics) is often used. These medicines are given in lower doses for pain than when used for depression. Anticonvulsants. These are usually used to treat seizures but may also reduce nerve (neuropathic) pain. Muscle relaxants. These relieve pain caused by sudden muscle tightening (spasms). You may also use a pain  medicine that is applied to the skin as a patch, cream, or gel (topical analgesic), such as a numbing medicine. These may cause fewer side effects than medicines taken by mouth. Do certain therapies as directed Some therapies can help with pain management. They include: Physical therapy. You will do exercises to gain strength and flexibility. A physical therapist may teach you exercises to move and stretch parts of your body that are weak, stiff, or painful. You can learn these exercises at physical therapy visits and practice them at home. Physical therapy may also involve: Massage. Heat wraps or applying heat or cold to affected areas. Electrical signals that interrupt pain signals (transcutaneous electrical nerve stimulation, TENS). Weak lasers that reduce pain and swelling (low-level laser therapy). Signals from your body that help you learn to regulate pain (biofeedback). Occupational therapy. This helps you to learn ways to function at home and work with less pain. Recreational therapy. This involves trying new activities or hobbies, such as a physical activity or drawing. Mental health therapy, including: Cognitive behavioral therapy (CBT). This helps you learn coping skills for dealing with pain. Acceptance and commitment therapy (ACT) to change the way you think and react to pain. Relaxation therapies, including muscle relaxation exercises and mindfulness-based stress reduction. Pain management counseling. This may be individual, family, or group counseling.  Receive medical treatments Medical treatments for pain management include: Nerve block injections. These may include a pain blocker and anti-inflammatory medicines. You may have injections: Near the spine to relieve chronic back or neck pain. Into joints to relieve back or joint pain. Into nerve areas that supply a painful area to relieve body pain. Into muscles (trigger point injections) to relieve some painful muscle  conditions.  A medical device placed near your spine to help block pain signals and relieve nerve pain or chronic back pain (spinal cord stimulation device). Acupuncture. Follow these instructions at home Medicines Take over-the-counter and prescription medicines only as told by your health care provider. If you are taking pain medicine, ask your health care providers about possible side effects to watch out for. Do not drive or use heavy machinery while taking prescription opioid pain medicine. Lifestyle  Do not use drugs or alcohol to reduce pain. If you drink alcohol, limit how much you have to: 0-1 drink a day for women who are not pregnant. 0-2 drinks a day for men. Know how much alcohol is in a drink. In the [place] one drink equals one 12 oz bottle of beer (355 mL), one 5 oz glass of wine (148 mL), or one 1 oz glass of hard liquor (44 mL). Do not use any products that contain nicotine or tobacco. These products include cigarettes, chewing tobacco, and vaping devices, such as e-cigarettes. If you need help quitting, ask your health care provider. Eat a healthy diet and maintain a healthy weight. Poor diet and excess weight may make pain worse. Eat foods that are high in fiber. These include fresh fruits and vegetables, whole grains, and beans. Limit foods that are high in fat and processed sugars, such as fried and sweet foods. Exercise regularly. Exercise lowers stress and may help relieve pain. Ask your health care provider what activities and exercises are safe for you. If your health care provider approves, join an exercise class that combines movement and stress reduction. Examples include yoga and tai chi. Get enough sleep. Lack of sleep may make pain worse. Lower stress as much as possible. Practice stress reduction techniques as told by your therapist. General instructions Work with all your pain management providers to find the treatments that work best for you. You are an  important member of your pain management team. There are many things you can do to reduce pain on your own. Consider joining an online or in-person support group for people who have chronic pain. Keep all follow-up visits. This is important. Where to find more information You can find more information about managing pain without opioids from: American Academy of Pain Medicine: painmed.org Institute for Chronic Pain: instituteforchronicpain.org American Chronic Pain Association: theacpa.org Contact a health care provider if: You have side effects from pain medicine. Your pain gets worse or does not get better with treatments or home therapy. You are struggling with anxiety or depression. Summary Many types of pain can be managed without opioids. Chronic pain may respond better to pain management without opioids. Pain is best managed when you and a team of health care providers work together. Pain management without opioids may include non-opioid medicines, medical treatments, physical therapy, mental health therapy, and lifestyle changes. Tell your health care providers if your pain gets worse or is not being managed well enough. This information is not intended to replace advice given to you by your health care provider. Make sure you discuss any questions you have with your health care provider. Document Revised: 08/01/2020 Document Reviewed: 08/01/2020 Elsevier Patient Education  2024 [employer].   This is a list of the screening recommended for you and due dates:  Health Maintenance  Topic Date Due   COVID-19 Vaccine (1) Never done   Eye exam for diabetics  Never done   Yearly kidney health urinalysis for diabetes  Never done   Pap with  HPV screening  05/07/2014   Hepatitis C Screening  06/25/2023*   Flu Shot  08/03/2023*   Hemoglobin A1C  05/16/2023   Complete foot exam   09/16/2023   Yearly kidney function blood test for diabetes  11/13/2023   Medicare Annual Wellness Visit   03/10/2024   Mammogram  12/07/2024   Colon Cancer Screening  01/08/2025   DTaP/Tdap/Td vaccine (2 - Td or Tdap) 12/24/2032   HIV Screening  Completed   Zoster (Shingles) Vaccine  Completed   HPV Vaccine  Aged Out  *Topic was postponed. The date shown is not the original due date.    Advanced directives: (Declined) Advance directive discussed with you today. Even though you declined this today, please call our office should you change your mind, and we can give you the proper paperwork for you to fill out.  Next Medicare Annual Wellness Visit scheduled for next year: Yes 03/15/23 @ 3:40

## 2023-03-11 NOTE — Progress Notes (Signed)
Subjective:   Sherry Price is a 52 y.o. female who presents for an Initial Medicare Annual Wellness Visit.  Visit Complete: Virtual I connected with  [name] on 03/11/23 by a audio enabled telemedicine application and verified that I am speaking with the correct person using two identifiers.  Patient Location: Home  Provider Location: Office/Clinic  I discussed the limitations of evaluation and management by telemedicine. The patient expressed understanding and agreed to proceed.  Vital Signs: Because this visit was a virtual/telehealth visit, some criteria may be missing or patient reported. Any vitals not documented were not able to be obtained and vitals that have been documented are patient reported.    Cardiac Risk Factors include: diabetes mellitus;dyslipidemia;hypertension;obesity (BMI >30kg/m2)     Objective:    Today's Vitals   03/11/23 1416 03/11/23 1417  Weight: (!) 315 lb (142.9 kg)   Height: 5\' 5"  (1.651 m)   PainSc:  6    Body mass index is 52.42 kg/m.     03/11/2023    2:39 PM 02/18/2022    1:31 PM 11/20/2021    8:04 AM 09/12/2020    3:23 AM 02/26/2020   11:12 AM 08/30/2019   10:16 AM 06/15/2019   11:29 AM  Advanced Directives  Does Patient Have a Medical Advance Directive? No No No No No No No  Would patient like information on creating a medical advance directive? No - Patient declined  No - Patient declined No - Patient declined No - Patient declined No - Patient declined     Current Medications (verified) Outpatient Encounter Medications as of 03/11/2023  Medication Sig   ALPRAZolam (XANAX) 0.5 MG tablet Take 1 tablet (0.5 mg total) by mouth daily as needed for anxiety.   amLODipine (NORVASC) 10 MG tablet Take 1 tablet (10 mg total) by mouth daily.   Aspirin-Acetaminophen-Caffeine (GOODY HEADACHE PO) Take 1 packet by mouth as needed.   atorvastatin (LIPITOR) 40 MG tablet Take 40 mg by mouth daily.   buPROPion (WELLBUTRIN XL) 150 MG  24 hr tablet Take 1 tablet (150 mg total) by mouth daily.   calcium carbonate (TUMS) 500 MG chewable tablet Chew 1 tablet (200 mg of elemental calcium total) by mouth 2 (two) times daily.   esomeprazole (NEXIUM) 20 MG capsule Nexium 20 mg capsule,delayed release   hydrochlorothiazide (HYDRODIURIL) 25 MG tablet Take 0.5 tablets (12.5 mg total) by mouth daily.   ibuprofen (ADVIL) 200 MG tablet Take by mouth.   labetalol (NORMODYNE) 200 MG tablet Take 1 tablet (200 mg total) by mouth 2 (two) times daily.   lidocaine (XYLOCAINE) 1 % (with preservative) injection by Infiltration route.   metFORMIN (GLUCOPHAGE) 500 MG tablet Take 1 tablet (500 mg total) by mouth 2 (two) times daily with a meal.   naproxen (NAPROSYN) 500 MG tablet Take 1 tablet (500 mg total) by mouth 2 (two) times daily with a meal.   ondansetron (ZOFRAN) 4 MG tablet    ondansetron (ZOFRAN-ODT) 4 MG disintegrating tablet DISSOLVE 1 TO 2 TABLETS ON THE TONGUE EVERY 8 HOURS AS NEEDED FOR NAUSEA OR VOMITING   oxyCODONE-acetaminophen (PERCOCET) 5-325 MG tablet Take 1 tablet by mouth every 8 (eight) hours as needed for severe pain (pain score 7-10).   tirzepatide (MOUNJARO) 10 MG/0.5ML Pen Inject 10 mg into the skin once a week.   triamcinolone acetonide (KENALOG-40) 40 MG/ML injection Inject into the articular space.   venlafaxine XR (EFFEXOR-XR) 150 MG 24 hr capsule Take 1 capsule (150  mg total) by mouth daily with breakfast.   venlafaxine XR (EFFEXOR-XR) 75 MG 24 hr capsule Take 1 capsule (75 mg total) by mouth daily with breakfast. Total of 225 mg daily. Take along with 150 mg cap   albuterol (PROAIR HFA) 108 (90 Base) MCG/ACT inhaler INHALE 2 PUFFS PO Q 4-6 H PRF WHEEZING SOB OR COUGH (Patient not taking: Reported on 03/11/2023)   ALPRAZolam (XANAX) 1 MG tablet  (Patient not taking: Reported on 03/11/2023)   cariprazine (VRAYLAR) 1.5 MG capsule TAKE ONE CAPSULE BY MOUTH DAILY FOR BIPOLAR (Patient not taking: Reported on 03/11/2023)    cariprazine (VRAYLAR) 3 MG capsule Take by mouth. (Patient not taking: Reported on 03/11/2023)   chlorhexidine (PERIDEX) 0.12 % solution SMARTSIG:By Mouth (Patient not taking: Reported on 03/11/2023)   clindamycin (CLEOCIN) 300 MG capsule Take 300 mg by mouth every 6 (six) hours. (Patient not taking: Reported on 03/11/2023)   Dulaglutide (TRULICITY) 0.75 MG/0.5ML SOAJ Inject into the skin. (Patient not taking: Reported on 03/11/2023)   escitalopram (LEXAPRO) 20 MG tablet  (Patient not taking: Reported on 03/11/2023)   etodolac (LODINE) 500 MG tablet  (Patient not taking: Reported on 03/11/2023)   fluticasone (FLONASE) 50 MCG/ACT nasal spray USE 1 SPRAY IEN QD (Patient not taking: Reported on 03/11/2023)   furosemide (LASIX) 40 MG tablet TK 1 T PO QAM (Patient not taking: Reported on 03/11/2023)   guaiFENesin-Codeine (GUAIATUSSIN AC PO) TK 10 ML PO Q 4 H PRN COU. NO MORE THAN 40 ML PER DAY (Patient not taking: Reported on 03/11/2023)   hydrALAZINE (APRESOLINE) 50 MG tablet Take by mouth. (Patient not taking: Reported on 03/11/2023)   ketoconazole (NIZORAL) 2 % cream  (Patient not taking: Reported on 03/11/2023)   ketorolac (TORADOL) 10 MG tablet  (Patient not taking: Reported on 03/11/2023)   lidocaine-prilocaine (EMLA) cream APPLY 1 APPLICATION TO THE AFFECTED AREA AS DIRECTED (Patient not taking: Reported on 03/11/2023)   linaclotide (LINZESS) 145 MCG CAPS capsule  (Patient not taking: Reported on 03/11/2023)   lisinopril (ZESTRIL) 5 MG tablet  (Patient not taking: Reported on 03/11/2023)   losartan (COZAAR) 25 MG tablet Take by mouth. (Patient not taking: Reported on 03/11/2023)   metoCLOPramide (REGLAN) 10 MG tablet  (Patient not taking: Reported on 03/11/2023)   omeprazole (PRILOSEC) 10 MG capsule Take by mouth. (Patient not taking: Reported on 03/11/2023)   potassium chloride SA (KLOR-CON M) 20 MEQ tablet TK 1 T PO D (Patient not taking: Reported on 03/11/2023)   predniSONE (DELTASONE) 20 MG tablet  (Patient  not taking: Reported on 03/11/2023)   risperiDONE (RISPERDAL) 1 MG tablet  (Patient not taking: Reported on 03/11/2023)   Semaglutide,0.25 or 0.5MG /DOS, (OZEMPIC, 0.25 OR 0.5 MG/DOSE,) 2 MG/1.5ML SOPN INJECT 0.25MG  UNDER THE SKIN IN THE ABDOMEN (Patient not taking: Reported on 03/11/2023)   sertraline (ZOLOFT) 50 MG tablet TK 1 T PO QD (Patient not taking: Reported on 03/11/2023)   tamoxifen (NOLVADEX) 20 MG tablet TK 1 T PO QD (Patient not taking: Reported on 03/11/2023)   trazodone (DESYREL) 300 MG tablet Take by mouth. (Patient not taking: Reported on 03/11/2023)   traZODone (DESYREL) 50 MG tablet TK 1 T PO QHS (Patient not taking: Reported on 03/11/2023)   Vitamin D, Ergocalciferol, (DRISDOL) 1.25 MG (50000 UNIT) CAPS capsule Take 50,000 Units by mouth once a week. (Patient not taking: Reported on 03/11/2023)   zolpidem (AMBIEN) 10 MG tablet  (Patient not taking: Reported on 03/11/2023)   No facility-administered encounter medications on file  as of 03/11/2023.    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   Panic attacks    Personal history of chemotherapy    Personal history of radiation therapy    Sleep apnea    Past Surgical History:  Procedure Laterality Date   BREAST BIOPSY Left 2013   +   BREAST BIOPSY Right    neg   BREAST LUMPECTOMY  2013   BREAST MAMMOSITE Left February 2013   BREAST REDUCTION SURGERY  2005   COLONOSCOPY WITH PROPOFOL N/A 01/09/2015   Procedure: COLONOSCOPY WITH PROPOFOL;  Surgeon: Midge Minium, MD;  Location: [facility] ENDOSCOPY;  Service: Endoscopy;  Laterality: N/A;   ESOPHAGOGASTRODUODENOSCOPY (EGD) WITH  PROPOFOL N/A 01/09/2015   Procedure: ESOPHAGOGASTRODUODENOSCOPY (EGD) WITH PROPOFOL;  Surgeon: Midge Minium, MD;  Location: [facility] ENDOSCOPY;  Service: Endoscopy;  Laterality: N/A;   OOPHORECTOMY Right 2013   REDUCTION MAMMAPLASTY     Family History  Problem Relation Age of Onset   Depression Mother    Hypertension Mother    Heart disease Mother    Diabetes Mother    Alcohol abuse Father    Breast cancer Cousin    Social History   Socioeconomic History   Marital status: Single    Spouse name: Not on file   Number of children: Not on file   Years of education: Not on file   Highest education level: Not on file  Occupational History   Not on file  Tobacco Use   Smoking status: Former    Types: Cigarettes   Smokeless tobacco: Never  Vaping Use   Vaping status: Never Used  Substance and Sexual Activity   Alcohol use: Yes    Comment: occasionally   Drug use: No   Sexual activity: Not on file  Other Topics Concern   Not on file  Social History Narrative   Never married   Social Determinants of Health   Financial Resource Strain: Low Risk  (03/11/2023)   Overall Financial Resource Strain (CARDIA)    Difficulty of Paying Living Expenses: Not hard at all  Food Insecurity: No Food Insecurity (03/11/2023)   Hunger Vital Sign    Worried About Running Out of Food in the Last Year: Never true    Ran Out of Food in the Last Year: Never true  Transportation Needs: No Transportation Needs (03/11/2023)   PRAPARE - Administrator, Civil Service (Medical): No    Lack of Transportation (Non-Medical): No  Physical Activity: Inactive (03/11/2023)   Exercise Vital Sign    Days of Exercise per Week: 0 days    Minutes of Exercise per Session: 0 min  Stress: Stress Concern Present (03/11/2023)   [employer] of Occupational Health - Occupational Stress Questionnaire    Feeling of Stress : Rather much  Social Connections: Moderately Isolated (03/11/2023)   Social Connection and  Isolation Panel [NHANES]    Frequency of Communication with Friends and Family: More than three times a week    Frequency of Social Gatherings with Friends and Family: Once a week    Attends Religious Services: More than  4 times per year    Active Member of Clubs or Organizations: No    Attends Banker Meetings: Never    Marital Status: Never married    Tobacco Counseling Counseling given: Not Answered   Clinical Intake:  Pre-visit preparation completed: Yes  Pain : 0-10 Pain Score: 6  Pain Type: Chronic pain Pain Location: Knee Pain Orientation: Right, Left Pain Descriptors / Indicators: Aching Pain Onset: More than a month ago Pain Frequency: Constant     BMI - recorded: 52.42 Nutritional Status: BMI > 30  Obese Nutritional Risks: None Diabetes: Yes CBG done?: No Did pt. bring in CBG monitor from home?: No  How often do you need to have someone help you when you read instructions, pamphlets, or other written materials from your doctor or pharmacy?: 1 - Never  Interpreter Needed?: No  Information entered by :: R. Serafino Burciaga LPN   Activities of Daily Living    03/11/2023    2:19 PM  In your present state of health, do you have any difficulty performing the following activities:  Hearing? 0  Vision? 0  Comment glasses  Difficulty concentrating or making decisions? 1  Walking or climbing stairs? 1  Dressing or bathing? 0  Doing errands, shopping? 1  Preparing Food and eating ? N  Using the Toilet? N  In the past six months, have you accidently leaked urine? N  Do you have problems with loss of bowel control? N  Managing your Medications? N  Managing your Finances? N  Housekeeping or managing your Housekeeping? N    Patient Care Team: Bethanie Dicker, NP as PCP - General (Nurse Practitioner) Johney Maine, MD (Unknown Physician Specialty) Lemar Livings Merrily Pew, MD (General Surgery) Ward, Elenora Fender, MD as Consulting Physician (Obstetrics and  Gynecology)  Indicate any recent Medical Services you may have received from other than [facility] providers in the past year (date may be approximate).     Assessment:   This is a routine wellness examination for Sherry Price.  Hearing/Vision screen Hearing Screening - Comments:: No issues Vision Screening - Comments:: glasses   Goals Addressed             This Visit's Progress    Patient Stated       Wants to lose weight and work on her mental status       Depression Screen    03/11/2023    2:33 PM 02/27/2023   11:02 AM 01/29/2023    1:03 PM 11/27/2022    1:15 PM 11/13/2022    9:04 AM 10/21/2022   10:41 AM 06/24/2022    3:08 PM  PHQ 2/9 Scores  PHQ - 2 Score 2 0 0 3 2 2 3   PHQ- 9 Score 8    8 11 12     Fall Risk    03/11/2023    2:21 PM 02/27/2023   11:02 AM 01/29/2023    1:03 PM 11/27/2022    1:15 PM 11/13/2022    9:03 AM  Fall Risk   Falls in the past year? 1 0 0 1 1  Number falls in past yr: 0 0  1 0  Injury with Fall? 1 0  1 1  Comment injured knee      Risk for fall due to : History of fall(s);Impaired balance/gait    No Fall Risks  Follow up Falls evaluation completed;Falls prevention discussed    Falls evaluation completed    MEDICARE RISK AT HOME: Medicare Risk at Home Any  stairs in or around the home?: No If so, are there any without handrails?: No Home free of loose throw rugs in walkways, pet beds, electrical cords, etc?: Yes Adequate lighting in your home to reduce risk of falls?: Yes Life alert?: No Use of a cane, walker or w/c?: Yes (walker at times) Grab bars in the bathroom?: No Shower chair or bench in shower?: No Elevated toilet seat or a handicapped toilet?: No  Cognitive Function:        03/11/2023    2:40 PM  6CIT Screen  What Year? 0 points  What month? 0 points  What time? 0 points  Count back from 20 0 points  Months in reverse 0 points  Repeat phrase 2 points  Total Score 2 points    Immunizations Immunization History   Administered Date(s) Administered   Influenza-Unspecified 02/02/2022   Tdap 12/25/2022   Zoster Recombinant(Shingrix) 09/22/2021, 12/25/2022    TDAP status: Up to date  Flu Vaccine status: Due, Education has been provided regarding the importance of this vaccine. Advised may receive this vaccine at local pharmacy or Health Dept. Aware to provide a copy of the vaccination record if obtained from local pharmacy or Health Dept. Verbalized acceptance and understanding.    Covid-19 vaccine status: Information provided on how to obtain vaccines.   Qualifies for Shingles Vaccine? Yes   Zostavax completed No   Shingrix Completed?: Yes  Screening Tests Health Maintenance  Topic Date Due   Medicare Annual Wellness (AWV)  Never done   COVID-19 Vaccine (1) Never done   OPHTHALMOLOGY EXAM  Never done   Diabetic kidney evaluation - Urine ACR  Never done   Cervical Cancer Screening (HPV/Pap Cotest)  05/07/2014   Hepatitis C Screening  06/25/2023 (Originally 11/30/1988)   INFLUENZA VACCINE  08/03/2023 (Originally 12/04/2022)   HEMOGLOBIN A1C  05/16/2023   FOOT EXAM  09/16/2023   Diabetic kidney evaluation - eGFR measurement  11/13/2023   MAMMOGRAM  12/07/2024   Colonoscopy  01/08/2025   DTaP/Tdap/Td (2 - Td or Tdap) 12/24/2032   HIV Screening  Completed   Zoster Vaccines- Shingrix  Completed   HPV VACCINES  Aged Out    Health Maintenance  Health Maintenance Due  Topic Date Due   Medicare Annual Wellness (AWV)  Never done   COVID-19 Vaccine (1) Never done   OPHTHALMOLOGY EXAM  Never done   Diabetic kidney evaluation - Urine ACR  Never done   Cervical Cancer Screening (HPV/Pap Cotest)  05/07/2014    Colorectal cancer screening: Type of screening: Colonoscopy. Completed 01/2015. Repeat every 10 years Will discuss with PCP at next visit  Mammogram status: Completed 12/2022. Repeat every year    Lung Cancer Screening: (Low Dose CT Chest recommended if Age 79-80 years, 20 pack-year  currently smoking OR have quit w/in 15years.) does not qualify.    Additional Screening:  Hepatitis C Screening: does not qualify; Completed NA age  Vision Screening: Recommended annual ophthalmology exams for early detection of glaucoma and other disorders of the eye. Is the patient up to date with their annual eye exam?  No  Who is the provider or what is the name of the office in which the patient attends annual eye exams? Patient will contact  insurance company and find out who accepts her insurance If pt is not established with a provider, would they like to be referred to a provider to establish care? No .   Dental Screening: Recommended annual dental exams for proper oral  hygiene  Diabetic Foot Exam: Diabetic Foot Exam: Completed 09/2022  Community Resource Referral / Chronic Care Management: CRR required this visit?  No   CCM required this visit?  No     Plan:     I have personally reviewed and noted the following in the patient's chart:   Medical and social history Use of alcohol, tobacco or illicit drugs  Current medications and supplements including opioid prescriptions. Patient is currently taking opioid prescriptions. Information provided to patient regarding non-opioid alternatives. Patient advised to discuss non-opioid treatment plan with their provider. Functional ability and status Nutritional status Physical activity Advanced directives List of other physicians Hospitalizations, surgeries, and ER visits in previous 12 months Vitals Screenings to include cognitive, depression, and falls Referrals and appointments  In addition, I have reviewed and discussed with patient certain preventive protocols, quality metrics, and best practice recommendations. A written personalized care plan for preventive services as well as general preventive health recommendations were provided to patient.     Sydell Axon, LPN   [id number]   After Visit Summary: (MyChart) Due to  this being a telephonic visit, the after visit summary with patients personalized plan was offered to patient via MyChart   Nurse Notes: None

## 2023-03-19 ENCOUNTER — Emergency Department: Payer: 59

## 2023-03-19 ENCOUNTER — Emergency Department
Admission: EM | Admit: 2023-03-19 | Discharge: 2023-03-20 | Disposition: A | Discharge status: Home / Self Care | Payer: 59 | Attending: Emergency Medicine | Admitting: Emergency Medicine

## 2023-03-19 ENCOUNTER — Encounter: Payer: Self-pay | Admitting: Emergency Medicine

## 2023-03-19 ENCOUNTER — Other Ambulatory Visit: Payer: Self-pay

## 2023-03-19 DIAGNOSIS — Z853 Personal history of malignant neoplasm of breast: Secondary | ICD-10-CM | POA: Insufficient documentation

## 2023-03-19 DIAGNOSIS — N939 Abnormal uterine and vaginal bleeding, unspecified: Secondary | ICD-10-CM | POA: Insufficient documentation

## 2023-03-19 DIAGNOSIS — E119 Type 2 diabetes mellitus without complications: Secondary | ICD-10-CM | POA: Diagnosis not present

## 2023-03-19 DIAGNOSIS — I10 Essential (primary) hypertension: Secondary | ICD-10-CM | POA: Insufficient documentation

## 2023-03-19 DIAGNOSIS — N938 Other specified abnormal uterine and vaginal bleeding: Secondary | ICD-10-CM

## 2023-03-19 LAB — CBC WITH DIFFERENTIAL/PLATELET
Abs Immature Granulocytes: 0.03 10*3/uL (ref 0.00–0.07)
Basophils Absolute: 0.1 10*3/uL (ref 0.0–0.1)
Basophils Relative: 1 %
Eosinophils Absolute: 0.3 10*3/uL (ref 0.0–0.5)
Eosinophils Relative: 3 %
HCT: 39.7 % (ref 36.0–46.0)
Hemoglobin: 13 g/dL (ref 12.0–15.0)
Immature Granulocytes: 0 %
Lymphocytes Relative: 32 %
Lymphs Abs: 3.2 10*3/uL (ref 0.7–4.0)
MCH: 29.8 pg (ref 26.0–34.0)
MCHC: 32.7 g/dL (ref 30.0–36.0)
MCV: 91.1 fL (ref 80.0–100.0)
Monocytes Absolute: 0.6 10*3/uL (ref 0.1–1.0)
Monocytes Relative: 6 %
Neutro Abs: 6.1 10*3/uL (ref 1.7–7.7)
Neutrophils Relative %: 58 %
Platelets: 351 10*3/uL (ref 150–400)
RBC: 4.36 MIL/uL (ref 3.87–5.11)
RDW: 14 % (ref 11.5–15.5)
WBC: 10.2 10*3/uL (ref 4.0–10.5)
nRBC: 0 % (ref 0.0–0.2)

## 2023-03-19 LAB — COMPREHENSIVE METABOLIC PANEL
ALT: 28 U/L (ref 0–44)
AST: 21 U/L (ref 15–41)
Albumin: 4.2 g/dL (ref 3.5–5.0)
Alkaline Phosphatase: 93 U/L (ref 38–126)
Anion gap: 6 (ref 5–15)
BUN: 8 mg/dL (ref 6–20)
CO2: 29 mmol/L (ref 22–32)
Calcium: 8.6 mg/dL — ABNORMAL LOW (ref 8.9–10.3)
Chloride: 102 mmol/L (ref 98–111)
Creatinine, Ser: 0.72 mg/dL (ref 0.44–1.00)
GFR, Estimated: >60 mL/min (ref >60)
Glucose, Bld: 92 mg/dL (ref 70–99)
Potassium: 4.1 mmol/L (ref 3.5–5.1)
Sodium: 137 mmol/L (ref 135–145)
Total Bilirubin: 0.3 mg/dL (ref <1.2)
Total Protein: 7.7 g/dL (ref 6.5–8.1)

## 2023-03-19 LAB — URINALYSIS, ROUTINE W REFLEX MICROSCOPIC
Bacteria, UA: NONE SEEN
Bilirubin Urine: NEGATIVE
Glucose, UA: NEGATIVE mg/dL
Ketones, ur: NEGATIVE mg/dL
Nitrite: NEGATIVE
Protein, ur: NEGATIVE mg/dL
Specific Gravity, Urine: 1.015 (ref 1.005–1.030)
pH: 7 (ref 5.0–8.0)

## 2023-03-19 LAB — POC URINE PREG, ED: Preg Test, Ur: NEGATIVE

## 2023-03-19 NOTE — Discharge Instructions (Signed)
Your ultrasound showed a thickened uterus that needs further evaluation by gynecologist.  Call Dr. Feliberto Gottron for an appointment.  Thank you for choosing [place] for your health care today!  Please see your primary doctor this week for a follow up appointment.   If you have any new, worsening, or unexpected symptoms call your doctor right away or come back to the emergency department for reevaluation.  It was my pleasure to care for you today.   Daneil Dan Modesto Charon, MD

## 2023-03-19 NOTE — ED Triage Notes (Signed)
Patient ambulatory to triage with complaints of vaginal bleeding. Patient states she has a history of breast cancer and prior to this has not had a period since 2013. She is also complaining of cramping, nausea, and fatigue. States that the blood was darker/ brown in nature and in the toilet as well as when she wipes.

## 2023-03-20 NOTE — ED Provider Notes (Signed)
[facility] Provider Note    Event Date/Time   First MD Initiated Contact with Patient 03/19/23 2341     (approximate)   History   Vaginal Bleeding   HPI  Sherry Price is a 52 y.o. female   Past medical history of breast cancer, diabetes, hypertension, here with vaginal bleeding.  She is postmenopausal about 10 years.  Developed vaginal bleeding approximately 5 days ago, lower abdominal cramping.  No urinary symptoms.  Bowel movements normal, no GI bleeding.   External Medical Documents Reviewed: CT of the abdomen pelvis in April 2024 showed grossly unremarkable uterus and ovaries without adnexal masses      Physical Exam   Triage Vital Signs: ED Triage Vitals [03/19/23 1951]  Encounter Vitals Group     BP 135/72     Systolic BP Percentile      Diastolic BP Percentile      Pulse Rate 85     Resp 18     Temp 98.1 F (36.7 C)     Temp src      SpO2 97 %     Weight (!) 318 lb (144.2 kg)     Height 5\' 5"  (1.651 m)     Head Circumference      Peak Flow      Pain Score 0     Pain Loc      Pain Education      Exclude from Growth Chart     Most recent vital signs: Vitals:   03/19/23 1951 03/20/23 0006  BP: 135/72 136/76  Pulse: 85 85  Resp: 18 18  Temp: 98.1 F (36.7 C) 98.2 F (36.8 C)  SpO2: 97% 98%    General: Awake, no distress.  CV:  Good peripheral perfusion.  Resp:  Normal effort.  Abd:  No distention.  Other:  Pleasant woman in no acute distress with normal vital signs and a soft nontender nondistended abdomen.  Defer pelvic exam as results of ultrasound had been reviewed and discussed.   ED Results / Procedures / Treatments   Labs (all labs ordered are listed, but only abnormal results are displayed) Labs Reviewed  URINALYSIS, ROUTINE W REFLEX MICROSCOPIC - Abnormal; Notable for the following components:      Result Value   Color, Urine YELLOW (*)    APPearance HAZY (*)    Hgb urine dipstick MODERATE (*)     Leukocytes,Ua MODERATE (*)    All other components within normal limits  COMPREHENSIVE METABOLIC PANEL - Abnormal; Notable for the following components:   Calcium 8.6 (*)    All other components within normal limits  CBC WITH DIFFERENTIAL/PLATELET  POC URINE PREG, ED     I ordered and reviewed the above labs they are notable for H&H within normal limits.  RADIOLOGY I independently reviewed and interpreted pelvic ultrasound and see a thickened endometrial wall I also reviewed radiologist's formal read.   PROCEDURES:  Critical Care performed: No  Procedures   MEDICATIONS ORDERED IN ED: Medications - No data to display  IMPRESSION / MDM / ASSESSMENT AND PLAN / ED COURSE  I reviewed the triage vital signs and the nursing notes.                                Patient's presentation is most consistent with acute presentation with potential threat to life or bodily function.  Differential diagnosis includes, but is  not limited to, ectopic pregnancy or pregnancy related complications, abnormal uterine bleeding, malignancy, acute blood loss anemia   The patient is on the cardiac monitor to evaluate for evidence of arrhythmia and/or significant heart rate changes.  MDM:    Abnormal uterine bleeding with thickened endometrial wall and pelvic ultrasound but normal H&H doubt life-threatening amount of blood loss, but will need follow-up with gynecology to rule out malignancy.  Soft nontender abdomen rules against surgical abdominal pathologies at this time.  Discharge.       FINAL CLINICAL IMPRESSION(S) / ED DIAGNOSES   Final diagnoses:  Vaginal bleeding  Dysfunctional uterine bleeding     Rx / DC Orders   ED Discharge Orders     None        Note:  This document was prepared using Dragon voice recognition software and may include unintentional dictation errors.    Pilar Jarvis, MD 03/20/23 [phone]

## 2023-03-27 ENCOUNTER — Ambulatory Visit (INDEPENDENT_AMBULATORY_CARE_PROVIDER_SITE_OTHER): Payer: 59 | Admitting: Nurse Practitioner

## 2023-03-27 ENCOUNTER — Encounter: Payer: Self-pay | Admitting: Nurse Practitioner

## 2023-03-27 VITALS — BP 130/82 | HR 94 | Temp 98.3°F | Ht 65.0 in | Wt 308.0 lb

## 2023-03-27 DIAGNOSIS — E119 Type 2 diabetes mellitus without complications: Secondary | ICD-10-CM

## 2023-03-27 DIAGNOSIS — R9389 Abnormal findings on diagnostic imaging of other specified body structures: Secondary | ICD-10-CM | POA: Insufficient documentation

## 2023-03-27 DIAGNOSIS — Z23 Encounter for immunization: Secondary | ICD-10-CM | POA: Diagnosis not present

## 2023-03-27 DIAGNOSIS — Z111 Encounter for screening for respiratory tuberculosis: Secondary | ICD-10-CM

## 2023-03-27 DIAGNOSIS — Z6841 Body Mass Index (BMI) 40.0 and over, adult: Secondary | ICD-10-CM

## 2023-03-27 DIAGNOSIS — N950 Postmenopausal bleeding: Secondary | ICD-10-CM | POA: Diagnosis not present

## 2023-03-27 DIAGNOSIS — Z7985 Long-term (current) use of injectable non-insulin antidiabetic drugs: Secondary | ICD-10-CM

## 2023-03-27 DIAGNOSIS — F32A Depression, unspecified: Secondary | ICD-10-CM

## 2023-03-27 DIAGNOSIS — I10 Essential (primary) hypertension: Secondary | ICD-10-CM | POA: Diagnosis not present

## 2023-03-27 LAB — POCT GLYCOSYLATED HEMOGLOBIN (HGB A1C): Hemoglobin A1C: 5.8 % — AB (ref 4.0–5.6)

## 2023-03-27 MED ORDER — TIRZEPATIDE 15 MG/0.5ML ~~LOC~~ SOAJ: 15.0000 mg | SUBCUTANEOUS | 1 refills | Status: DC

## 2023-03-27 MED ORDER — TIRZEPATIDE 12.5 MG/0.5ML ~~LOC~~ SOAJ: 12.5000 mg | SUBCUTANEOUS | 0 refills | Status: DC

## 2023-03-27 NOTE — Assessment & Plan Note (Signed)
Her hypertension is controlled on Norvasc 10 mg daily, Hydrochlorothiazide 12.5 mg daily, and Labetalol 200 mg BID. We will continue the current antihypertensive regimen.

## 2023-03-27 NOTE — Assessment & Plan Note (Signed)
She recently experienced significant vaginal bleeding, and a transvaginal ultrasound showed a thickened endometrial lining. She is postmenopausal, and was on Tamoxifen for breast cancer. She will follow up with Ob-Gyn on Monday as scheduled for further evaluation and an endometrial biopsy.

## 2023-03-27 NOTE — Assessment & Plan Note (Signed)
She is on Effexor XR 225 mg daily and Xanax 0.5 mg daily PRN for anxiety and has been taking Wellbutrin XL 150 mg every other day for 2-3 weeks but wishes to discontinue. We will discontinue Wellbutrin and continue Effexor and Xanax. She has established care with a counselor and will continue seeing them regularly. Encouraged to contact if worsening symptoms, unusual behavior changes or suicidal thoughts occur.

## 2023-03-27 NOTE — Progress Notes (Signed)
Bethanie Dicker, NP-C Phone: [phone]  Sherry Price is a 52 y.o. female who presents today for follow up.   Discussed the use of AI scribe software for clinical note transcription with the patient, who gave verbal consent to proceed.  History of Present Illness   The patient, with a history of breast cancer, hypertension, and diabetes, has been on a weight loss journey, losing 17 pounds since the last visit three months ago. She attributes this weight loss to a combination of intermittent fasting, dietary changes, and the medication Mounjaro. She reports a decrease in appetite and has been consuming mostly water, egg drop soup, and juice during the day, with a large meal in the evening. She has also been trying to increase her physical activity.  The patient has also been experiencing vaginal bleeding, which is concerning given her postmenopausal status following breast cancer treatment with tamoxifen. An ultrasound revealed a thickened uterine lining, and she has an upcoming appointment with an OB-GYN on Monday for a biopsy.  In terms of mental health, the patient has been seeing a counselor for trauma and is on Effexor and Xanax for anxiety. She has been trying to wean off Wellbutrin, taking it every other day for the past two to three weeks, and feels her mood has been stable enough to stop it completely. She has been having trouble sleeping and is considering trying melatonin.  The patient is also on metformin for diabetes, but has been trying to reduce her medication load and has been taking it only once a day for the past two weeks. She is also on Norvasc, hydrochlorothiazide, and labetalol for hypertension.      Social History   Tobacco Use  Smoking Status Former   Types: Cigarettes  Smokeless Tobacco Never    Current Outpatient Medications on File Prior to Visit  Medication Sig Dispense Refill   ALPRAZolam (XANAX) 0.5 MG tablet Take 1 tablet (0.5 mg total) by mouth daily  as needed for anxiety. 30 tablet 5   amLODipine (NORVASC) 10 MG tablet Take 1 tablet (10 mg total) by mouth daily. 90 tablet 3   Aspirin-Acetaminophen-Caffeine (GOODY HEADACHE PO) Take 1 packet by mouth as needed.     atorvastatin (LIPITOR) 40 MG tablet Take 40 mg by mouth daily.     buPROPion (WELLBUTRIN XL) 150 MG 24 hr tablet Take 1 tablet (150 mg total) by mouth daily. 90 tablet 3   calcium carbonate (TUMS) 500 MG chewable tablet Chew 1 tablet (200 mg of elemental calcium total) by mouth 2 (two) times daily. 20 tablet 0   hydrochlorothiazide (HYDRODIURIL) 25 MG tablet Take 0.5 tablets (12.5 mg total) by mouth daily. 45 tablet 3   ibuprofen (ADVIL) 200 MG tablet Take by mouth.     ketorolac (TORADOL) 10 MG tablet      labetalol (NORMODYNE) 200 MG tablet Take 1 tablet (200 mg total) by mouth 2 (two) times daily. 180 tablet 3   lidocaine (XYLOCAINE) 1 % (with preservative) injection by Infiltration route.     metFORMIN (GLUCOPHAGE) 500 MG tablet Take 1 tablet (500 mg total) by mouth 2 (two) times daily with a meal. 180 tablet 3   metoCLOPramide (REGLAN) 10 MG tablet      naproxen (NAPROSYN) 500 MG tablet Take 1 tablet (500 mg total) by mouth 2 (two) times daily with a meal. 60 tablet 2   omeprazole (PRILOSEC) 10 MG capsule Take by mouth.     ondansetron (ZOFRAN-ODT) 4 MG disintegrating tablet  DISSOLVE 1 TO 2 TABLETS ON THE TONGUE EVERY 8 HOURS AS NEEDED FOR NAUSEA OR VOMITING 30 tablet 2   oxyCODONE-acetaminophen (PERCOCET) 5-325 MG tablet Take 1 tablet by mouth every 8 (eight) hours as needed for severe pain (pain score 7-10). 90 tablet 0   triamcinolone acetonide (KENALOG-40) 40 MG/ML injection Inject into the articular space.     venlafaxine XR (EFFEXOR-XR) 150 MG 24 hr capsule Take 1 capsule (150 mg total) by mouth daily with breakfast. 90 capsule 3   venlafaxine XR (EFFEXOR-XR) 75 MG 24 hr capsule Take 1 capsule (75 mg total) by mouth daily with breakfast. Total of 225 mg daily. Take along  with 150 mg cap 30 capsule 1   No current facility-administered medications on file prior to visit.     ROS see history of present illness  Objective  Physical Exam Vitals:   03/27/23 0821  BP: 130/82  Pulse: 94  Temp: 98.3 F (36.8 C)  SpO2: 99%    BP Readings from Last 3 Encounters:  03/27/23 130/82  03/20/23 136/76  02/27/23 120/88   Wt Readings from Last 3 Encounters:  03/27/23 (!) 308 lb (139.7 kg)  03/19/23 (!) 318 lb (144.2 kg)  03/11/23 (!) 315 lb (142.9 kg)    Physical Exam Constitutional:      General: She is not in acute distress.    Appearance: Normal appearance. She is obese.  HENT:     Head: Normocephalic.  Cardiovascular:     Rate and Rhythm: Normal rate and regular rhythm.     Heart sounds: Normal heart sounds.  Pulmonary:     Effort: Pulmonary effort is normal.     Breath sounds: Normal breath sounds.  Skin:    General: Skin is warm and dry.  Neurological:     General: No focal deficit present.     Mental Status: She is alert.  Psychiatric:        Mood and Affect: Mood normal.        Behavior: Behavior normal.     Assessment/Plan: Please see individual problem list.  Type 2 diabetes mellitus without complication, without long-term current use of insulin (HCC) Assessment & Plan: Her Type 2 Diabetes Mellitus is controlled on Metformin and Mounjaro, with an A1c of 6.8 at the last visit. She expressed a desire to discontinue Metformin. POCT A1c today in office- 5.8. Advised patient she can discontinue Metformin daily. She will continue Mounjaro once a week. She will increase to 12.5 mg weekly x 4 weeks then increase to 15 mg weekly thereafter. Encouraged to continue healthy diet and increasing physical activity.   Orders: -     POCT glycosylated hemoglobin (Hb A1C) -     Tirzepatide; Inject 12.5 mg into the skin once a week.  Dispense: 2 mL; Refill: 0 -     Tirzepatide; Inject 15 mg into the skin once a week.  Dispense: 6 mL; Refill:  1  Body mass index (BMI) of 50-59.9 in adult [facility]) Assessment & Plan: She has lost 17 lbs since the last visit and is currently on Mounjaro 10mg , which she tolerates well and wishes to increase the dosage. We will increase the Mounjaro dosage as tolerated and continue her current diet while increasing protein intake, being mindful of the sugar content in juices.  Orders: -     Tirzepatide; Inject 12.5 mg into the skin once a week.  Dispense: 2 mL; Refill: 0 -     Tirzepatide; Inject 15 mg into  the skin once a week.  Dispense: 6 mL; Refill: 1  Abnormal vaginal bleeding with endometrial thickness greater than 5 mm present on transvaginal ultrasound in postmenopausal patient Assessment & Plan: She recently experienced significant vaginal bleeding, and a transvaginal ultrasound showed a thickened endometrial lining. She is postmenopausal, and was on Tamoxifen for breast cancer. She will follow up with Ob-Gyn on Monday as scheduled for further evaluation and an endometrial biopsy.    Depressive disorder Assessment & Plan: She is on Effexor XR 225 mg daily and Xanax 0.5 mg daily PRN for anxiety and has been taking Wellbutrin XL 150 mg every other day for 2-3 weeks but wishes to discontinue. We will discontinue Wellbutrin and continue Effexor and Xanax. She has established care with a counselor and will continue seeing them regularly. Encouraged to contact if worsening symptoms, unusual behavior changes or suicidal thoughts occur.    Benign essential hypertension Assessment & Plan: Her hypertension is controlled on Norvasc 10 mg daily, Hydrochlorothiazide 12.5 mg daily, and Labetalol 200 mg BID. We will continue the current antihypertensive regimen.   Need for influenza vaccination -     Flu vaccine trivalent PF, 6mos and older(Flulaval,Afluria,Fluarix,Fluzone)  Visit for TB skin test -     QuantiFERON-TB Gold Plus   Return in about 6 months (around 09/24/2023) for Follow up.   Bethanie Dicker, NP-C [facility] - [employer]

## 2023-03-27 NOTE — Assessment & Plan Note (Signed)
She has lost 17 lbs since the last visit and is currently on Mounjaro 10mg , which she tolerates well and wishes to increase the dosage. We will increase the Mounjaro dosage as tolerated and continue her current diet while increasing protein intake, being mindful of the sugar content in juices.

## 2023-03-27 NOTE — Assessment & Plan Note (Signed)
Her Type 2 Diabetes Mellitus is controlled on Metformin and Mounjaro, with an A1c of 6.8 at the last visit. She expressed a desire to discontinue Metformin. POCT A1c today in office- 5.8. Advised patient she can discontinue Metformin daily. She will continue Mounjaro once a week. She will increase to 12.5 mg weekly x 4 weeks then increase to 15 mg weekly thereafter. Encouraged to continue healthy diet and increasing physical activity.

## 2023-03-30 ENCOUNTER — Encounter: Payer: Self-pay | Admitting: Obstetrics & Gynecology

## 2023-03-30 ENCOUNTER — Other Ambulatory Visit (HOSPITAL_COMMUNITY)
Admission: RE | Admit: 2023-03-30 | Discharge: 2023-03-30 | Disposition: A | Discharge status: Home / Self Care | Payer: 59 | Source: Ambulatory Visit

## 2023-03-30 ENCOUNTER — Ambulatory Visit: Payer: 59 | Admitting: Obstetrics & Gynecology

## 2023-03-30 ENCOUNTER — Other Ambulatory Visit (HOSPITAL_COMMUNITY)
Admission: RE | Admit: 2023-03-30 | Discharge: 2023-03-30 | Disposition: A | Discharge status: Home / Self Care | Payer: 59 | Source: Ambulatory Visit | Attending: Obstetrics & Gynecology | Admitting: Obstetrics & Gynecology

## 2023-03-30 ENCOUNTER — Other Ambulatory Visit: Payer: Self-pay

## 2023-03-30 VITALS — BP 135/85 | HR 80 | Wt 302.5 lb

## 2023-03-30 DIAGNOSIS — N859 Noninflammatory disorder of uterus, unspecified: Secondary | ICD-10-CM | POA: Diagnosis not present

## 2023-03-30 DIAGNOSIS — N950 Postmenopausal bleeding: Secondary | ICD-10-CM | POA: Diagnosis not present

## 2023-03-30 DIAGNOSIS — Z113 Encounter for screening for infections with a predominantly sexual mode of transmission: Secondary | ICD-10-CM

## 2023-03-30 DIAGNOSIS — Z1151 Encounter for screening for human papillomavirus (HPV): Secondary | ICD-10-CM | POA: Insufficient documentation

## 2023-03-30 DIAGNOSIS — Z0000 Encounter for general adult medical examination without abnormal findings: Secondary | ICD-10-CM | POA: Insufficient documentation

## 2023-03-30 DIAGNOSIS — R9389 Abnormal findings on diagnostic imaging of other specified body structures: Secondary | ICD-10-CM

## 2023-03-30 DIAGNOSIS — Z124 Encounter for screening for malignant neoplasm of cervix: Secondary | ICD-10-CM

## 2023-03-30 DIAGNOSIS — Z01411 Encounter for gynecological examination (general) (routine) with abnormal findings: Secondary | ICD-10-CM | POA: Insufficient documentation

## 2023-03-30 MED ORDER — OXYCODONE-ACETAMINOPHEN 5-325 MG PO TABS: 1.0000 | ORAL_TABLET | Freq: Three times a day (TID) | ORAL | 0 refills | Status: DC | PRN

## 2023-03-30 NOTE — Progress Notes (Signed)
    GYNECOLOGY PROGRESS NOTE  Subjective:    Patient ID: Sherry Price, female    DOB: [birth year], 52 y.o.   MRN: [id number]  HPI  Patient is a 52 y.o. single P6 here as a new patient with PMB. She  was treated with surgery, chemo, radiation and tamoxifen for breast cancer in 2013. Her periods stopped coming at that time. She started with PMB about 3 months ago on random days. It is now occurring daily. She went to the ED last week and had a pelvic ultrasound that showed an 11.5 mm endometrium.  She is sexually active with the same partner for 4 years. She requests testing for "everything", no symptoms today. She also reports a h/o abnormal pap smears and none recently done.  The following portions of the patient's history were reviewed and updated as appropriate: allergies, current medications, past family history, past medical history, past social history, past surgical history, and problem list.  Review of Systems Pertinent items are noted in HPI.   Objective:   Blood pressure 135/85, pulse 80, weight (!) 302 lb 8 oz (137.2 kg). Body mass index is 50.34 kg/m. Well nourished, well hydrated Black female, no apparent distress She is ambulating and conversing normally.   Consent signed, time out done Long Graves speculum used. Moderate amount of yellowish discharge noted. Aptima swab obtained. Her cervix appeared stenotic. Cervix prepped with betadine and sprayed with Hurricaine spray. I then grasped with a single tooth tenaculum. The cervical stenosis prevented passage of the Pipell so I tried the plastic dilators. They bent. I used a 9 French metal dilator and was able to enter the endometrium. I used the Pipelle for 1 pass and got mostly mucous. She was unable to tolerate a second pass. Uterus sounded to 9 cm  She tolerated the procedure well.     Assessment:   1. PMB (postmenopausal bleeding)      Plan:   1. PMB (postmenopausal bleeding)  - [facility] - await  pathology  2.  Screening for cervical cancer- pap smear  I will send the (probably) inadequate EMBX specimen. She will follow up with Dr. Valentino Saxon. She may need a better sample of her endometrium with d&c.  STI testing per patient request

## 2023-03-30 NOTE — Telephone Encounter (Signed)
Patient is requesting a refill of Oxycodone 5-325.   Filled  Written  ID  Drug  QTY  Days  Prescriber  RX #  Dispenser  Refill  Daily Dose*  Pymt Type  PMP  03/19/2023 11/13/2022 3  Alprazolam 0.5 Mg Tablet 30.00 30 Ka Les [id number] [facility] ([facility]798) 4/5 1.00 LME Medicare [place] 02/27/2023 02/27/2023 3  Oxycodone-Acetaminophen 5-325 90.00 30 Yu Sht [id number] [facility] (5798) 0/0 22.50 MME Medicare [place]

## 2023-03-31 LAB — CERVICOVAGINAL ANCILLARY ONLY
Bacterial Vaginitis (gardnerella): NEGATIVE
Candida Glabrata: NEGATIVE
Candida Vaginitis: NEGATIVE
Comment: NEGATIVE
Comment: NEGATIVE
Comment: NEGATIVE
Comment: NEGATIVE
Trichomonas: NEGATIVE

## 2023-03-31 LAB — HEPATITIS C ANTIBODY: Hep C Virus Ab: NONREACTIVE

## 2023-03-31 LAB — HIV ANTIBODY (ROUTINE TESTING W REFLEX): HIV Screen 4th Generation wRfx: NONREACTIVE

## 2023-03-31 LAB — QUANTIFERON-TB GOLD PLUS
Mitogen-NIL: >10 [IU]/mL
NIL: 0.06 [IU]/mL
QuantiFERON-TB Gold Plus: NEGATIVE
TB1-NIL: 0.01 [IU]/mL
TB2-NIL: 0.01 [IU]/mL

## 2023-03-31 LAB — SURGICAL PATHOLOGY

## 2023-03-31 LAB — RPR: RPR Ser Ql: NONREACTIVE

## 2023-03-31 LAB — FOLLICLE STIMULATING HORMONE: FSH: 23.7 m[IU]/mL

## 2023-03-31 LAB — HEPATITIS B SURFACE ANTIGEN: Hepatitis B Surface Ag: NEGATIVE

## 2023-04-01 LAB — CYTOLOGY - PAP
Chlamydia: NEGATIVE
Comment: NEGATIVE
Comment: NEGATIVE
Comment: NORMAL
Diagnosis: NEGATIVE
High risk HPV: NEGATIVE
Neisseria Gonorrhea: NEGATIVE

## 2023-04-14 NOTE — Progress Notes (Unsigned)
    GYNECOLOGY PROGRESS NOTE  Subjective:    Patient ID: Sherry Price, female    DOB: [birth year], 52 y.o.   MRN: [id number]  HPI  Patient is a 52 y.o. [id number] female who presents for evaluation of postmenopausal bleeding. She was evaluated by Dr. Marice Potter on 03/30/2023 for PMB. She reported then that she was treated with surgery, chemo, radiation and tamoxifen for breast cancer in 2013. Her periods stopped coming at that time. She started with PMB about 3 months ago on random days. It is now occurring daily. She went to the ED last week and had a pelvic ultrasound that showed an 11.5 mm endometrium.  An endometrial biopsy was done, but sample was inadequate. She recommended her to follow up with MD for possible endometrial biopsy by D&C.  {Common ambulatory SmartLinks:19316}  Review of Systems {ros; complete:30496}   Objective:   There were no vitals taken for this visit. There is no height or weight on file to calculate BMI. General appearance: {general exam:16600} Abdomen: {abdominal exam:16834} Pelvic: {pelvic exam:16852::"cervix normal in appearance","external genitalia normal","no adnexal masses or tenderness","no cervical motion tenderness","rectovaginal septum normal","uterus normal size, shape, and consistency","vagina normal without discharge"} Extremities: {extremity exam:5109} Neurologic: {neuro exam:17854}  Pathology:  Surgical Pathology Report   Clinical History: PMB, thickened endometrium (nt)   FINAL MICROSCOPIC DIAGNOSIS:   A. ENDOMETRIUM, BIOPSY:  Inactive endometrium.  Negative for atypia, hyperplasia or malignancy.   GROSS DESCRIPTION:   Received in formalin is a 0.7 x 0.5 x 0.3 cm aggregate of pink-gray to  dark red soft tissue/material, submitted in 1 block.     Assessment:   1. PMB (postmenopausal bleeding)   2. Thickened endometrium      Plan:   There are no diagnoses linked to this encounter.     Hildred Laser, MD [facility]

## 2023-04-16 ENCOUNTER — Ambulatory Visit: Payer: 59 | Admitting: Obstetrics and Gynecology

## 2023-04-16 DIAGNOSIS — N950 Postmenopausal bleeding: Secondary | ICD-10-CM

## 2023-04-16 DIAGNOSIS — R9389 Abnormal findings on diagnostic imaging of other specified body structures: Secondary | ICD-10-CM
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
## 2023-04-20 ENCOUNTER — Encounter: Payer: Self-pay | Admitting: Obstetrics and Gynecology

## 2023-04-21 ENCOUNTER — Ambulatory Visit: Payer: 59 | Admitting: Psychiatry

## 2023-04-23 ENCOUNTER — Encounter: Payer: 59 | Attending: Physical Medicine & Rehabilitation | Admitting: Physical Medicine & Rehabilitation

## 2023-04-23 ENCOUNTER — Encounter: Payer: Self-pay | Admitting: Physical Medicine & Rehabilitation

## 2023-04-23 VITALS — BP 128/84 | HR 81 | Ht 65.0 in | Wt 309.0 lb

## 2023-04-23 DIAGNOSIS — G8929 Other chronic pain: Secondary | ICD-10-CM | POA: Diagnosis present

## 2023-04-23 DIAGNOSIS — Z5181 Encounter for therapeutic drug level monitoring: Secondary | ICD-10-CM | POA: Diagnosis not present

## 2023-04-23 DIAGNOSIS — M25561 Pain in right knee: Secondary | ICD-10-CM | POA: Diagnosis not present

## 2023-04-23 DIAGNOSIS — M25562 Pain in left knee: Secondary | ICD-10-CM | POA: Insufficient documentation

## 2023-04-23 DIAGNOSIS — M170 Bilateral primary osteoarthritis of knee: Secondary | ICD-10-CM | POA: Insufficient documentation

## 2023-04-23 MED ORDER — OXYCODONE-ACETAMINOPHEN 5-325 MG PO TABS: 1.0000 | ORAL_TABLET | Freq: Three times a day (TID) | ORAL | 0 refills | Status: DC | PRN

## 2023-04-23 NOTE — Progress Notes (Signed)
Subjective:    Patient ID: Sherry Price, female    DOB: 1970/06/10, 52 y.o.   MRN: [id number]  HPI  Sherry Price is a 52 y.o. year old female  who  has a past Price history of Arthritis, Breast cancer (HCC), Diabetes mellitus without complication (HCC), Hypertension, Malignant neoplasm of 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 (HCC) (05/21/2011), Panic attacks, Personal history of chemotherapy, Personal history of radiation therapy, and Sleep apnea.   They are presenting to PM&R clinic as a new patient for pain management evaluation. They were referred by Sherry Price for bilateral knee pain.  Her pain is 7-8 out of 10 worsens with walking.  Patient reports she has had bilateral knee pain for many years.  Pain is worsened with activity.  Patient reports occasional right back pain mostly when ambulating longer distances.  Furthermore she also has pain due to muscle spasms in her left arm.  She thinks this is related to prior treatment of cancer of her left breast.  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  She typically walks without a cane for short distances but uses a walker for longer distances.   She has been seen by [facility] sports medicine.  She has received cortisone shots in her knees with benefit.  Cortisone does help her knee pain bilaterally however she feels like she is still very limited by her pain.  She reports her last knee injections were in March.  She says she had gel injections years ago " rooster comb" and is not sure how these helped.  She did not continue these injections regularly at the time.   She reports previously using oxycodone/Xtampza from Sherry Price.  Patient says she discontinued seeing Sherry Price because they used a different insurance for 1 visit resulting in a $300 bill.  As she was unable to pay this bill she was unable to continue following with Sherry Price.  She feels like  this provided her improved pain control and she was able to walk a lot further.      Red flag symptoms: No red flags for back pain endorsed in Hx or ROS   Medications tried:   Topical medications  Nsaids- ibuprofen- helps slightly, she is currently using Tylenol  -did not help much in the past Opiates  Oyxoconde and xtamaza -were helping control her pain until he would discontinued several months ago Tramadol- didn't help Hydrocodone- She doesn't recall Gabapentin / Lyrica - Denies use TCAs  - Denies use SNRIs -currently on venlafaxine     Other treatments: Knee braces help PT- few months ago, helped her pain  TENs unit- Denies  Injections- Gel injections? Vs cortisone didn't help- tried years ago , cortisone helps - last in march  Surgery- limted by wt, taking ozempic      Interval History 11/27/22 Sherry Price is here for bilateral cortisone knee injections.  She reports she has not had cortisone injection to her knees 3 months.  Reports cortisone injections were previously helpful to her pain.  She continues to work on losing weight.  Knee pain continues to be severe and limiting.  Oxycodone ordered this week however she has not picked this up yet.  We discussed EtOH noted on prior UDS, she was not on oxycodone at the time.  She agrees to discontinue use of alcohol since restarting this medication.   Interval History 01/29/23 Sherry Price is here for  follow-up regarding her chronic knee pain.  Reports that knee brace and TENS unit are providing some benefit.  She went on a cruise earlier this month had a lot of increased pain due to activities.  She feels that she cannot do as much as she would like to do due to her knee pain.  Oxycodone is helping but not lasting long enough.  Knee cortisone injection helped but did not last long enough.  She feels like these lasted just a few weeks.  Interval History 02/27/23 Pt is here for b/l zilretta injections today.  It has been 3 months since  her last cortisone knee injections.  She reports her knees continue to cause significant pain and limiting her function.  Percocet 5 mg 3 times daily does help reduce her pain and keep her more comfortable.  She is not having any side effects of this medication.  Interval History 04/29/23 Patient is here for follow-up regarding her chronic knee pain.  She reports its better since Zilretta injections.  She feels like these are helping a lot more than short acting cortisone did.  Injection still helping but it is gradually decreasing.  Percocet is also helping keep her pain at a more tolerable level, no side effects with the medication.  She continues to work on weight loss.   Pain Inventory Average Pain 7 Pain Right Now 7 My pain is aching  In the last 24 hours, has pain interfered with the following? General activity 6 Relation with others 4 Enjoyment of life 6 What TIME of day is your pain at its worst? morning , daytime, evening, and night Sleep (in general) Fair  Pain is worse with: walking and standing Pain improves with: rest, pacing activities, medication, and injections Relief from Meds: 6  Family History  Problem Relation Age of Onset   Depression Mother    Hypertension Mother    Heart disease Mother    Diabetes Mother    Alcohol abuse Father    Breast cancer Cousin    Social History   Socioeconomic History   Marital status: Single    Spouse name: Not on file   Number of children: Not on file   Years of education: Not on file   Highest education level: Not on file  Occupational History   Not on file  Tobacco Use   Smoking status: Former    Types: Cigarettes   Smokeless tobacco: Never  Vaping Use   Vaping status: Never Used  Substance and Sexual Activity   Alcohol use: Yes    Comment: occasionally   Drug use: No   Sexual activity: Not on file  Other Topics Concern   Not on file  Social History Narrative   Never married   Social Drivers of Manufacturing engineer Strain: Low Risk  (03/11/2023)   Overall Financial Resource Strain (CARDIA)    Difficulty of Paying Living Expenses: Not hard at all  Food Insecurity: No Food Insecurity (03/11/2023)   Hunger Vital Sign    Worried About Running Out of Food in the Last Year: Never true    Ran Out of Food in the Last Year: Never true  Transportation Needs: No Transportation Needs (03/11/2023)   PRAPARE - Administrator, Civil Service (Price): No    Lack of Transportation (Non-Price): No  Physical Activity: Inactive (03/11/2023)   Exercise Vital Sign    Days of Exercise per Week: 0 days    Minutes of Exercise  per Session: 0 min  Stress: Stress Concern Present (03/11/2023)   [employer] of Occupational Health - Occupational Stress Questionnaire    Feeling of Stress : Rather much  Social Connections: Moderately Isolated (03/11/2023)   Social Connection and Isolation Panel [NHANES]    Frequency of Communication with Friends and Family: More than three times a week    Frequency of Social Gatherings with Friends and Family: Once a week    Attends Religious Services: More than 4 times per year    Active Member of [employer] or Organizations: No    Attends Banker Meetings: Never    Marital Status: Never married   Past Surgical History:  Procedure Laterality Date   BREAST BIOPSY Left 2013   +   BREAST BIOPSY Right    neg   BREAST LUMPECTOMY  2013   BREAST MAMMOSITE Left February 2013   BREAST REDUCTION SURGERY  2005   COLONOSCOPY WITH PROPOFOL N/A 01/09/2015   Procedure: COLONOSCOPY WITH PROPOFOL;  Surgeon: Midge Minium, MD;  Location: [facility];  Service: Endoscopy;  Laterality: N/A;   ESOPHAGOGASTRODUODENOSCOPY (EGD) WITH PROPOFOL N/A 01/09/2015   Procedure: ESOPHAGOGASTRODUODENOSCOPY (EGD) WITH PROPOFOL;  Surgeon: Midge Minium, MD;  Location: [facility];  Service: Endoscopy;  Laterality: N/A;   OOPHORECTOMY Right 2013   REDUCTION MAMMAPLASTY      Past Surgical History:  Procedure Laterality Date   BREAST BIOPSY Left 2013   +   BREAST BIOPSY Right    neg   BREAST LUMPECTOMY  2013   BREAST MAMMOSITE Left February 2013   BREAST REDUCTION SURGERY  2005   COLONOSCOPY WITH PROPOFOL N/A 01/09/2015   Procedure: COLONOSCOPY WITH PROPOFOL;  Surgeon: Midge Minium, MD;  Location: [facility];  Service: Endoscopy;  Laterality: N/A;   ESOPHAGOGASTRODUODENOSCOPY (EGD) WITH PROPOFOL N/A 01/09/2015   Procedure: ESOPHAGOGASTRODUODENOSCOPY (EGD) WITH PROPOFOL;  Surgeon: Midge Minium, MD;  Location: [facility];  Service: Endoscopy;  Laterality: N/A;   OOPHORECTOMY Right 2013   REDUCTION MAMMAPLASTY     Past Price History:  Diagnosis Date   Arthritis    Breast cancer (HCC)    left breast - chemo and radiation   Diabetes mellitus without complication (HCC)    Hypertension    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   Panic attacks    Personal history of chemotherapy    Personal history of radiation therapy    Sleep apnea    BP 128/84   Pulse 81   Ht 5\' 5"  (1.651 m)   Wt (!) 309 lb (140.2 kg)   LMP  (LMP Unknown)   SpO2 96%   BMI 51.42 kg/m   Opioid Risk Score:   Fall Risk Score:  `1  Depression screen PHQ 2/9     03/27/2023    8:30 AM 03/11/2023    2:33 PM 02/27/2023   11:02 AM 01/29/2023    1:03 PM 11/27/2022    1:15 PM 11/13/2022    9:04 AM 10/21/2022   10:41 AM  Depression screen PHQ 2/9  Decreased Interest 1 1 0 0 2 1 1   Down, Depressed, Hopeless 1 1 0 0 1 1 1   PHQ - 2 Score 2 2 0 0 3 2 2   Altered sleeping 2 1    1 2   Tired, decreased energy 2 3    1 3   Change  in appetite 1 0    1 2  Feeling bad or failure about yourself  1 1    2 1   Trouble concentrating 2 1    1 1   Moving slowly or fidgety/restless 1 0    0 0  Suicidal thoughts 0 0    0   PHQ-9 Score 11 8    8 11    Difficult doing work/chores Very difficult Somewhat difficult    Very difficult       Review of Systems  Musculoskeletal:  Positive for back pain and gait problem.  All other systems reviewed and are negative.      Objective:   Physical Exam     Gen: no distress, normal appearing, obese HEENT: oral mucosa pink and moist, NCAT Chest: normal effort, normal rate of breathing Abd: soft, non-distended Psych: pleasant, normal affect Skin: intact Neuro: Alert and awake, follows commands, cranial nerves II through XII grossly intact, normal speech and language No focal motor or sensory deficits noted Musculoskeletal:  Mild tenderness at joint line bilateral knees, more anteriorly No significant lower back or paraspinal tenderness noted Bilateral knee joint crepitus noted with ROM Minimal pain with passive ROM of both knees today Antalgic gait No knee joint redness or sign of infection noted       R knee xray 11/13/22 IMPRESSION: 1. Severe 3 compartmental osteoarthritis, with trace reactive suprapatellar joint effusion. 2. No acute fracture.    Assessment & Plan:  Bilateral knee OA -Opiate risk tool moderate -Previously on Xtampza and oxycodone as needed at different pain clinic -Advised to request records from [facility]- pending -UDS and pain agreement prior visit -ETOH on prior UDS- discussed DC and she agrees. This UDS was prior to her starting on oxycodone -Monitor routine pill counts, PDMP -Patient was out of oxycodone early last visit, Pill counts consistent today 2 tabs left  -Zilretta injections last visit reported to work much better than short acting cortisone did in the past -Could consider gel injection for referral for genicular nerve block -She reports benefit with knee brace and TENS unit -Continue Percocet  5 mg 3 times daily as needed -Patient did not bring in all her pills today, reports it is in her pill organizer at home-warning today  will ask for letter to be sent    Muscles spasms L arm  -Improved, continue Magnesium supplementation   Morbid obesity -She was on Ozempic-- now says she is on Mongero -Reports she is losing weight- 17 lbs  -Provided list of medications for chronic pain and weight loss prior visit

## 2023-05-01 ENCOUNTER — Telehealth: Payer: Self-pay | Admitting: *Deleted

## 2023-05-01 NOTE — Telephone Encounter (Signed)
-----   Message from Fanny Dance sent at 04/23/2023 10:08 PM EST ----- Regarding: meds Hey, she didn't bring all her pills today, can we send letter asking to bring pills to each visit please.

## 2023-05-01 NOTE — Telephone Encounter (Signed)
Letter sent.

## 2023-05-04 ENCOUNTER — Telehealth: Payer: Self-pay

## 2023-05-04 NOTE — Telephone Encounter (Signed)
Called pt and she stated she needs a letter sent via mychart from provider stating that she has had a CPE and that she is good to return to work.

## 2023-05-04 NOTE — Telephone Encounter (Signed)
Copied from CRM [id number]. Topic: General - Other >> May 04, 2023  2:34 PM Florestine Avers wrote: Reason for CRM: Patient is requesting a phone call from the nurse.

## 2023-05-05 ENCOUNTER — Encounter: Payer: Self-pay | Admitting: Nurse Practitioner

## 2023-05-08 ENCOUNTER — Other Ambulatory Visit: Payer: Self-pay | Admitting: Nurse Practitioner

## 2023-05-08 DIAGNOSIS — I10 Essential (primary) hypertension: Secondary | ICD-10-CM

## 2023-05-12 ENCOUNTER — Telehealth: Payer: Self-pay

## 2023-05-12 NOTE — Telephone Encounter (Signed)
 Copied from CRM [id number]. Topic: Medical Record Request - Records Request >> May 12, 2023  4:13 PM Adaysia C wrote: Reason for CRM: Patient has requested for the providers office to fax a letter of her PP test results to [facility] fax#[phone]. Please follow up with patient for any questions [phone]

## 2023-05-13 NOTE — Telephone Encounter (Signed)
 E fax has been sent

## 2023-05-13 NOTE — Telephone Encounter (Signed)
 Detailed vm left informing pt that her results have been faxed

## 2023-05-16 ENCOUNTER — Other Ambulatory Visit: Payer: Self-pay | Admitting: Nurse Practitioner

## 2023-05-16 DIAGNOSIS — F419 Anxiety disorder, unspecified: Secondary | ICD-10-CM

## 2023-05-21 ENCOUNTER — Encounter: Payer: 59 | Attending: Physical Medicine & Rehabilitation | Admitting: Physical Medicine & Rehabilitation

## 2023-05-21 ENCOUNTER — Encounter: Payer: Self-pay | Admitting: Nurse Practitioner

## 2023-05-21 ENCOUNTER — Ambulatory Visit (INDEPENDENT_AMBULATORY_CARE_PROVIDER_SITE_OTHER): Payer: 59 | Admitting: Nurse Practitioner

## 2023-05-21 ENCOUNTER — Telehealth: Payer: Self-pay

## 2023-05-21 VITALS — BP 128/84 | HR 84 | Temp 97.5°F | Ht 65.0 in | Wt 301.8 lb

## 2023-05-21 DIAGNOSIS — Z5181 Encounter for therapeutic drug level monitoring: Secondary | ICD-10-CM | POA: Insufficient documentation

## 2023-05-21 DIAGNOSIS — L989 Disorder of the skin and subcutaneous tissue, unspecified: Secondary | ICD-10-CM

## 2023-05-21 DIAGNOSIS — R399 Unspecified symptoms and signs involving the genitourinary system: Secondary | ICD-10-CM

## 2023-05-21 DIAGNOSIS — F32A Depression, unspecified: Secondary | ICD-10-CM

## 2023-05-21 DIAGNOSIS — G894 Chronic pain syndrome: Secondary | ICD-10-CM | POA: Insufficient documentation

## 2023-05-21 DIAGNOSIS — K5900 Constipation, unspecified: Secondary | ICD-10-CM

## 2023-05-21 DIAGNOSIS — M170 Bilateral primary osteoarthritis of knee: Secondary | ICD-10-CM | POA: Insufficient documentation

## 2023-05-21 DIAGNOSIS — M25561 Pain in right knee: Secondary | ICD-10-CM

## 2023-05-21 DIAGNOSIS — Z79891 Long term (current) use of opiate analgesic: Secondary | ICD-10-CM | POA: Insufficient documentation

## 2023-05-21 DIAGNOSIS — E119 Type 2 diabetes mellitus without complications: Secondary | ICD-10-CM

## 2023-05-21 LAB — URINALYSIS, MICROSCOPIC ONLY

## 2023-05-21 LAB — POCT URINALYSIS DIPSTICK
Blood, UA: NEGATIVE
Glucose, UA: NEGATIVE
Nitrite, UA: POSITIVE
Protein, UA: POSITIVE — AB
Spec Grav, UA: >=1.03 — AB (ref 1.010–1.025)
Urobilinogen, UA: 1 U/dL
pH, UA: 6 (ref 5.0–8.0)

## 2023-05-21 MED ORDER — ONDANSETRON 4 MG PO TBDP: ORAL_TABLET | ORAL | 2 refills | Status: DC

## 2023-05-21 MED ORDER — NAPROXEN 500 MG PO TABS: 500.0000 mg | ORAL_TABLET | Freq: Two times a day (BID) | ORAL | 2 refills | Status: DC

## 2023-05-21 MED ORDER — CEPHALEXIN 500 MG PO CAPS: 500.0000 mg | ORAL_CAPSULE | Freq: Two times a day (BID) | ORAL | 0 refills | Status: AC

## 2023-05-21 MED ORDER — MUPIROCIN 2 % EX OINT: 1.0000 | TOPICAL_OINTMENT | Freq: Two times a day (BID) | CUTANEOUS | 0 refills | Status: DC

## 2023-05-21 MED ORDER — VENLAFAXINE HCL ER 150 MG PO CP24: 150.0000 mg | ORAL_CAPSULE | Freq: Every day | ORAL | 3 refills | Status: AC

## 2023-05-21 NOTE — Progress Notes (Signed)
Established Patient Office Visit  Subjective:  Patient ID: Sherry Price, female    DOB: December 26, 1970  Age: 53 y.o. MRN: [id number]  CC:  Chief Complaint  Patient presents with   Acute Visit    Bowel & bladder issues Very uncomfortable when going to the bathroom    HPI  Sherry Price presents with a triad of symptoms including gastrointestinal, urinary, and dermatological complaints. They report a week-long history of constipation and abdominal discomfort, which they describe as being relieved by passing gas. They have attempted to manage this with increased water intake and consumption of yogurt, but without significant improvement. They deny use of any stool softeners or laxatives.  Concurrently, the patient has been experiencing urinary symptoms for approximately a week. They describe a persistent sensation of urinary pressure and a strong odor to their urine. They have not noticed any changes in their fluid intake.  The patient also reports a concerning lesion in the gluteal cleft, which they describe as a possible abscess. They note that it has been present for one to two weeks and seems to drain intermittently. They have attempted to manage this with over-the-counter medicated wipes and peroxide, but without resolution of the issue. They deny any pain associated with the lesion.  The patient has recently returned to work and has noticed some weight loss, but it is unclear if these changes are related to their current symptoms. They deny any similar past episodes.  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   Panic attacks    Personal history of  chemotherapy    Personal history of radiation therapy    Sleep apnea     Past Surgical History:  Procedure Laterality Date   BREAST BIOPSY Left 2013   +   BREAST BIOPSY Right    neg   BREAST LUMPECTOMY  2013   BREAST MAMMOSITE Left February 2013   BREAST REDUCTION SURGERY  2005   COLONOSCOPY WITH PROPOFOL N/A 01/09/2015   Procedure: COLONOSCOPY WITH PROPOFOL;  Surgeon: Midge Minium, MD;  Location: [facility];  Service: Endoscopy;  Laterality: N/A;   ESOPHAGOGASTRODUODENOSCOPY (EGD) WITH PROPOFOL N/A 01/09/2015   Procedure: ESOPHAGOGASTRODUODENOSCOPY (EGD) WITH PROPOFOL;  Surgeon: Midge Minium, MD;  Location: [facility];  Service: Endoscopy;  Laterality: N/A;   OOPHORECTOMY Right 2013   REDUCTION MAMMAPLASTY      Family History  Problem Relation Age of Onset   Depression Mother    Hypertension Mother    Heart disease Mother    Diabetes Mother    Alcohol abuse Father    Breast cancer Cousin     Social History   Socioeconomic History   Marital status: Single    Spouse name: Not on file   Number of children: Not on file   Years of education: Not on file   Highest education level: Not on file  Occupational History   Not on file  Tobacco Use   Smoking status: Former    Types: Cigarettes   Smokeless tobacco: Never  Vaping Use   Vaping status: Never Used  Substance and Sexual Activity   Alcohol use:  Yes    Comment: occasionally   Drug use: No   Sexual activity: Not on file  Other Topics Concern   Not on file  Social History Narrative   Never married   Social Drivers of Health   Financial Resource Strain: Low Risk  (03/11/2023)   Overall Financial Resource Strain (CARDIA)    Difficulty of Paying Living Expenses: Not hard at all  Food Insecurity: No Food Insecurity (03/11/2023)   Hunger Vital Sign    Worried About Running Out of Food in the Last Year: Never true    Ran Out of Food in the Last Year: Never true  Transportation Needs: No Transportation Needs  (03/11/2023)   PRAPARE - Administrator, Civil Service (Medical): No    Lack of Transportation (Non-Medical): No  Physical Activity: Inactive (03/11/2023)   Exercise Vital Sign    Days of Exercise per Week: 0 days    Minutes of Exercise per Session: 0 min  Stress: Stress Concern Present (03/11/2023)   [employer] of Occupational Health - Occupational Stress Questionnaire    Feeling of Stress : Rather much  Social Connections: Moderately Isolated (03/11/2023)   Social Connection and Isolation Panel [NHANES]    Frequency of Communication with Friends and Family: More than three times a week    Frequency of Social Gatherings with Friends and Family: Once a week    Attends Religious Services: More than 4 times per year    Active Member of [employer] or Organizations: No    Attends Banker Meetings: Never    Marital Status: Never married  Intimate Partner Violence: Not At Risk (03/11/2023)   Humiliation, Afraid, Rape, and Kick questionnaire    Fear of Current or Ex-Partner: No    Emotionally Abused: No    Physically Abused: No    Sexually Abused: No     Outpatient Medications Prior to Visit  Medication Sig Dispense Refill   ALPRAZolam (XANAX) 0.5 MG tablet TAKE 1 TABLET(0.5 MG) BY MOUTH DAILY AS NEEDED FOR ANXIETY 30 tablet 5   amLODipine (NORVASC) 10 MG tablet Take 1 tablet (10 mg total) by mouth daily. 90 tablet 3   Aspirin-Acetaminophen-Caffeine (GOODY HEADACHE PO) Take 1 packet by mouth as needed.     atorvastatin (LIPITOR) 40 MG tablet Take 40 mg by mouth daily.     calcium carbonate (TUMS) 500 MG chewable tablet Chew 1 tablet (200 mg of elemental calcium total) by mouth 2 (two) times daily. 20 tablet 0   hydrochlorothiazide (HYDRODIURIL) 25 MG tablet TAKE 1/2 TABLET(12.5 MG) BY MOUTH DAILY 45 tablet 3   ibuprofen (ADVIL) 200 MG tablet Take by mouth.     labetalol (NORMODYNE) 200 MG tablet Take 1 tablet (200 mg total) by mouth 2 (two) times daily. 180 tablet 3    lidocaine (XYLOCAINE) 1 % (with preservative) injection by Infiltration route.     oxyCODONE-acetaminophen (PERCOCET) 5-325 MG tablet Take 1 tablet by mouth every 8 (eight) hours as needed for severe pain (pain score 7-10). 90 tablet 0   tirzepatide (MOUNJARO) 12.5 MG/0.5ML Pen Inject 12.5 mg into the skin once a week. 2 mL 0   triamcinolone acetonide (KENALOG-40) 40 MG/ML injection Inject into the articular space.     naproxen (NAPROSYN) 500 MG tablet Take 1 tablet (500 mg total) by mouth 2 (two) times daily with a meal. 60 tablet 2   ondansetron (ZOFRAN-ODT) 4 MG disintegrating tablet DISSOLVE 1 TO 2 TABLETS ON THE TONGUE EVERY 8  HOURS AS NEEDED FOR NAUSEA OR VOMITING 30 tablet 2   venlafaxine XR (EFFEXOR-XR) 150 MG 24 hr capsule Take 1 capsule (150 mg total) by mouth daily with breakfast. 90 capsule 3   venlafaxine XR (EFFEXOR-XR) 75 MG 24 hr capsule Take 1 capsule (75 mg total) by mouth daily with breakfast. Total of 225 mg daily. Take along with 150 mg cap 30 capsule 1   buPROPion (WELLBUTRIN XL) 150 MG 24 hr tablet Take 1 tablet (150 mg total) by mouth daily. 90 tablet 3   ketorolac (TORADOL) 10 MG tablet      metFORMIN (GLUCOPHAGE) 500 MG tablet Take 1 tablet (500 mg total) by mouth 2 (two) times daily with a meal. 180 tablet 3   metoCLOPramide (REGLAN) 10 MG tablet      omeprazole (PRILOSEC) 10 MG capsule Take by mouth.     tirzepatide (MOUNJARO) 15 MG/0.5ML Pen Inject 15 mg into the skin once a week. 6 mL 1   No facility-administered medications prior to visit.    Allergies  Allergen Reactions   Losartan Hives    ROS Review of Systems Negative unless indicated in HPI.    Objective:    Physical Exam Constitutional:      Appearance: Normal appearance.  Cardiovascular:     Rate and Rhythm: Normal rate and regular rhythm.     Pulses: Normal pulses.     Heart sounds: Normal heart sounds.  Abdominal:     General: Bowel sounds are normal.     Palpations: Abdomen is soft.      Tenderness: There is no abdominal tenderness. There is no right CVA tenderness or left CVA tenderness.  Musculoskeletal:     Cervical back: Normal range of motion.  Skin:    Findings: Lesion present.     Comments:  In gluteal cleft.  Neurological:     General: No focal deficit present.     Mental Status: She is alert. Mental status is at baseline.  Psychiatric:        Mood and Affect: Mood normal.        Behavior: Behavior normal.        Thought Content: Thought content normal.        Judgment: Judgment normal.     BP 128/84   Pulse 84   Temp (!) 97.5 F (36.4 C)   Ht 5\' 5"  (1.651 m)   Wt (!) 301 lb 12.8 oz (136.9 kg)   LMP  (LMP Unknown)   SpO2 99%   BMI 50.22 kg/m  Wt Readings from Last 3 Encounters:  05/21/23 (!) 301 lb 12.8 oz (136.9 kg)  04/23/23 (!) 309 lb (140.2 kg)  03/30/23 (!) 302 lb 8 oz (137.2 kg)     Health Maintenance  Topic Date Due   COVID-19 Vaccine (1) Never done   Pneumococcal Vaccine 89-29 Years old (1 of 2 - PCV) Never done   OPHTHALMOLOGY EXAM  Never done   Diabetic kidney evaluation - Urine ACR  Never done   FOOT EXAM  09/16/2023   HEMOGLOBIN A1C  09/24/2023   Medicare Annual Wellness (AWV)  03/10/2024   Diabetic kidney evaluation - eGFR measurement  03/18/2024   MAMMOGRAM  12/07/2024   Colonoscopy  01/08/2025   Cervical Cancer Screening (HPV/Pap Cotest)  03/29/2028   DTaP/Tdap/Td (2 - Td or Tdap) 12/24/2032   INFLUENZA VACCINE  Completed   Hepatitis C Screening  Completed   HIV Screening  Completed   Zoster Vaccines- Shingrix  Completed   HPV VACCINES  Aged Out    There are no preventive care reminders to display for this patient.  Lab Results  Component Value Date   TSH 0.82 05/14/2022   Lab Results  Component Value Date   WBC 10.2 03/19/2023   HGB 13.0 03/19/2023   HCT 39.7 03/19/2023   MCV 91.1 03/19/2023   PLT 351 03/19/2023   Lab Results  Component Value Date   NA 137 03/19/2023   K 4.1 03/19/2023   CO2 29  03/19/2023   GLUCOSE 92 03/19/2023   BUN 8 03/19/2023   CREATININE 0.72 03/19/2023   BILITOT 0.3 03/19/2023   ALKPHOS 93 03/19/2023   AST 21 03/19/2023   ALT 28 03/19/2023   PROT 7.7 03/19/2023   ALBUMIN 4.2 03/19/2023   CALCIUM 8.6 (L) 03/19/2023   ANIONGAP 6 03/19/2023   GFR 98.37 11/13/2022   Lab Results  Component Value Date   CHOL 133 05/14/2022   Lab Results  Component Value Date   HDL 53.00 05/14/2022   Lab Results  Component Value Date   LDLCALC 65 05/14/2022   Lab Results  Component Value Date   TRIG 79.0 05/14/2022   Lab Results  Component Value Date   CHOLHDL 3 05/14/2022   Lab Results  Component Value Date   HGBA1C 5.8 (A) 03/27/2023      Assessment & Plan:  UTI symptoms Assessment & Plan: POCT urinalysis positive for leukocytes and nitrite. Urine culture pending. Will treat with cephalexin and if needed will change the antibiotic depending upon urine culture.    Orders: -     POCT urinalysis dipstick -     Urine Culture -     Urine Microscopic  Skin lesion Assessment & Plan: Draining lesion in the intergluteal cleft. No pain. The wound was cleaned with saline solution and dressed. Sent a swab for culture. -Apply Mupirocin ointment to the area. -Follow-up in 1 week to assess healing.  Orders: -     WOUND CULTURE  Constipation, unspecified constipation type Assessment & Plan: Constipation Abdominal discomfort relieved by passing gas. Bowel movement yesterday but reports feeling constipated for 1 week. -Advised to start over-the-counter Miralax or stool softener.   Other orders -     Mupirocin; Apply 1 Application topically 2 (two) times daily.  Dispense: 22 g; Refill: 0 -     Cephalexin; Take 1 capsule (500 mg total) by mouth 2 (two) times daily for 10 days.  Dispense: 20 capsule; Refill: 0    Follow-up: Return in about 1 week (around 05/28/2023), or PCP or me.   Kara Dies, NP

## 2023-05-21 NOTE — Addendum Note (Signed)
Addended by: Donavan Foil on: 05/21/2023 03:33 PM   Modules accepted: Orders

## 2023-05-21 NOTE — Telephone Encounter (Signed)
Patient saw Kara Dies today and request refills on the following medications:  Wellbutrin 150 mg Lidocaine 1% Metformin 500 mg Naproxen 500 mg Zofran 4 mg Venlafaxine 150 mg

## 2023-05-23 LAB — URINE CULTURE
MICRO NUMBER:: 15964916
SPECIMEN QUALITY:: ADEQUATE

## 2023-05-24 DIAGNOSIS — R399 Unspecified symptoms and signs involving the genitourinary system: Secondary | ICD-10-CM | POA: Insufficient documentation

## 2023-05-24 DIAGNOSIS — L989 Disorder of the skin and subcutaneous tissue, unspecified: Secondary | ICD-10-CM | POA: Insufficient documentation

## 2023-05-24 DIAGNOSIS — K5900 Constipation, unspecified: Secondary | ICD-10-CM | POA: Insufficient documentation

## 2023-05-24 LAB — WOUND CULTURE
MICRO NUMBER:: 15964618
SPECIMEN QUALITY:: ADEQUATE

## 2023-05-24 NOTE — Assessment & Plan Note (Signed)
Constipation Abdominal discomfort relieved by passing gas. Bowel movement yesterday but reports feeling constipated for 1 week. -Advised to start over-the-counter Miralax or stool softener.

## 2023-05-24 NOTE — Assessment & Plan Note (Signed)
POCT urinalysis positive for leukocytes and nitrite. Urine culture pending. Will treat with cephalexin and if needed will change the antibiotic depending upon urine culture.

## 2023-05-24 NOTE — Assessment & Plan Note (Signed)
Draining lesion in the intergluteal cleft. No pain. The wound was cleaned with saline solution and dressed. Sent a swab for culture. -Apply Mupirocin ointment to the area. -Follow-up in 1 week to assess healing.

## 2023-05-28 ENCOUNTER — Encounter: Payer: Self-pay | Admitting: Physical Medicine & Rehabilitation

## 2023-05-28 ENCOUNTER — Encounter (HOSPITAL_BASED_OUTPATIENT_CLINIC_OR_DEPARTMENT_OTHER): Payer: 59 | Admitting: Physical Medicine & Rehabilitation

## 2023-05-28 VITALS — BP 106/72 | HR 94 | Ht 65.0 in | Wt 303.0 lb

## 2023-05-28 DIAGNOSIS — M170 Bilateral primary osteoarthritis of knee: Secondary | ICD-10-CM | POA: Diagnosis not present

## 2023-05-28 DIAGNOSIS — Z79891 Long term (current) use of opiate analgesic: Secondary | ICD-10-CM | POA: Diagnosis present

## 2023-05-28 DIAGNOSIS — Z5181 Encounter for therapeutic drug level monitoring: Secondary | ICD-10-CM | POA: Diagnosis present

## 2023-05-28 DIAGNOSIS — G894 Chronic pain syndrome: Secondary | ICD-10-CM | POA: Diagnosis present

## 2023-05-28 MED ORDER — OXYCODONE-ACETAMINOPHEN 5-325 MG PO TABS: 1.0000 | ORAL_TABLET | Freq: Three times a day (TID) | ORAL | 0 refills | Status: DC | PRN

## 2023-05-28 NOTE — Progress Notes (Signed)
Subjective:    Patient ID: Sherry Price, female    DOB: Apr 07, 1971, 53 y.o.   MRN: [id number]  HPI  Sherry Price is a 53 y.o. 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   They are presenting to PM&R clinic as a new patient for pain management evaluation. They were referred by Gardiner Rhyme for bilateral knee pain.  Her pain is 7-8 out of 10 worsens with walking.  Patient reports she has had bilateral knee pain for many years.  Pain is worsened with activity.  Patient reports occasional right back pain mostly when ambulating longer distances.  Furthermore she also has pain due to muscle spasms in her left arm.  She thinks this is related to prior treatment of cancer of her left breast.  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  She typically walks without a cane for short distances but uses a walker for longer distances.   She has been seen by [facility] sports medicine.  She has received cortisone shots in her knees with benefit.  Cortisone does help her knee pain bilaterally however she feels like she is still very limited by her pain.  She reports her last knee injections were in March.  She says she had gel injections years ago " rooster comb" and is not sure how these helped.  She did not continue these injections regularly at the time.   She reports previously using oxycodone/Xtampza from [facility].  Patient says she discontinued seeing [facility] because they used a different insurance for 1 visit resulting in a $300 bill.  As she was unable to pay this bill she was unable to continue following with [facility] medical.  She feels like  this provided her improved pain control and she was able to walk a lot further.      Red flag symptoms: No red flags for back pain endorsed in Hx or ROS   Medications tried:   Topical medications  Nsaids- ibuprofen- helps slightly, she is currently using Tylenol  -did not help much in the past Opiates  Oyxoconde and xtamaza -were helping control her pain until he would discontinued several months ago Tramadol- didn't help Hydrocodone- She doesn't recall Gabapentin / Lyrica - Denies use TCAs  - Denies use SNRIs -currently on venlafaxine     Other treatments: Knee braces help Sherry Price- few months ago, helped her pain  TENs unit- Denies  Injections- Gel injections? Vs cortisone didn't help- tried years ago , cortisone helps - last in march  Surgery- limted by wt, taking ozempic      Interval History 11/27/22 Sherry Price is here for bilateral cortisone knee injections.  She reports she has not had cortisone injection to her knees 3 months.  Reports cortisone injections were previously helpful to her pain.  She continues to work on losing weight.  Knee pain continues to be severe and limiting.  Oxycodone ordered this week however she has not picked this up yet.  We discussed EtOH noted on prior UDS, she was not on oxycodone at the time.  She agrees to discontinue use of alcohol since restarting this medication.   Interval History 01/29/23 Sherry Price is here for  follow-up regarding her chronic knee pain.  Reports that knee brace and TENS unit are providing some benefit.  She went on a cruise earlier this month had a lot of increased pain due to activities.  She feels that she cannot do as much as she would like to do due to her knee pain.  Oxycodone is helping but not lasting long enough.  Knee cortisone injection helped but did not last long enough.  She feels like these lasted just a few weeks.  Interval History 02/27/23 Sherry Price is here for b/l zilretta injections today.  It has been 3 months since  her last cortisone knee injections.  She reports her knees continue to cause significant pain and limiting her function.  Percocet 5 mg 3 times daily does help reduce her pain and keep her more comfortable.  She is not having any side effects of this medication.  Interval History 04/29/23 Patient is here for follow-up regarding her chronic knee pain.  She reports its better since Zilretta injections.  She feels like these are helping a lot more than short acting cortisone did.  Injection still helping but it is gradually decreasing.  Percocet is also helping keep her pain at a more tolerable level, no side effects with the medication.  She continues to work on weight loss.   Interval history 05/28/2023 Patient reports the prior bilateral Zilretta injections were helpful but have worn off.  She would like a repeat injection for both of her knees.  Percocet has been helping to keep her pain at a more tolerable level.  No side effects with the medication.  Pain Inventory Average Pain 7 Pain Right Now 7 My pain is aching  In the last 24 hours, has pain interfered with the following? General activity 6 Relation with others 4 Enjoyment of life 6 What TIME of day is your pain at its worst? morning , daytime, evening, and night Sleep (in general) Fair  Pain is worse with: walking and standing Pain improves with: rest, pacing activities, medication, and injections Relief from Meds: 6  Family History  Problem Relation Age of Onset   Depression Mother    Hypertension Mother    Heart disease Mother    Diabetes Mother    Alcohol abuse Father    Breast cancer Cousin    Social History   Socioeconomic History   Marital status: Single    Spouse name: Not on file   Number of children: Not on file   Years of education: Not on file   Highest education level: Not on file  Occupational History   Not on file  Tobacco Use   Smoking status: Former    Types: Cigarettes   Smokeless tobacco: Never   Vaping Use   Vaping status: Never Used  Substance and Sexual Activity   Alcohol use: Yes    Comment: occasionally   Drug use: No   Sexual activity: Not on file  Other Topics Concern   Not on file  Social History Narrative   Never married   Social Drivers of Corporate investment banker Strain: Low Risk  (03/11/2023)   Overall Financial Resource Strain (CARDIA)    Difficulty of Paying Living Expenses: Not hard at all  Food Insecurity: No Food Insecurity (03/11/2023)   Hunger Vital Sign    Worried About Running Out of Food in the Last Year: Never true    Ran Out of Food in the Last Year: Never true  Transportation Needs: No  Transportation Needs (03/11/2023)   PRAPARE - Administrator, Civil Service (Medical): No    Lack of Transportation (Non-Medical): No  Physical Activity: Inactive (03/11/2023)   Exercise Vital Sign    Days of Exercise per Week: 0 days    Minutes of Exercise per Session: 0 min  Stress: Stress Concern Present (03/11/2023)   [employer] of Occupational Health - Occupational Stress Questionnaire    Feeling of Stress : Rather much  Social Connections: Moderately Isolated (03/11/2023)   Social Connection and Isolation Panel [NHANES]    Frequency of Communication with Friends and Family: More than three times a week    Frequency of Social Gatherings with Friends and Family: Once a week    Attends Religious Services: More than 4 times per year    Active Member of [employer] or Organizations: No    Attends Banker Meetings: Never    Marital Status: Never married   Past Surgical History:  Procedure Laterality Date   BREAST BIOPSY Left 2013   +   BREAST BIOPSY Right    neg   BREAST LUMPECTOMY  2013   BREAST MAMMOSITE Left February 2013   BREAST REDUCTION SURGERY  2005   COLONOSCOPY WITH PROPOFOL N/A 01/09/2015   Procedure: COLONOSCOPY WITH PROPOFOL;  Surgeon: Midge Minium, MD;  Location: [facility] END[facility]COPY;  Service: Endoscopy;  Laterality: N/A;    ESOPHAGOGASTRODUODENOSCOPY (EGD) WITH PROPOFOL N/A 01/09/2015   Procedure: ESOPHAGOGASTRODUODENOSCOPY (EGD) WITH PROPOFOL;  Surgeon: Midge Minium, MD;  Location: [facility] ENDOSCOPY;  Service: Endoscopy;  Laterality: N/A;   OOPHORECTOMY Right 2013   REDUCTION MAMMAPLASTY     Past Surgical History:  Procedure Laterality Date   BREAST BIOPSY Left 2013   +   BREAST BIOPSY Right    neg   BREAST LUMPECTOMY  2013   BREAST MAMMOSITE Left February 2013   BREAST REDUCTION SURGERY  2005   COLONOSCOPY WITH PROPOFOL N/A 01/09/2015   Procedure: COLONOSCOPY WITH PROPOFOL;  Surgeon: Midge Minium, MD;  Location: [facility] ENDOSCOPY;  Service: Endoscopy;  Laterality: N/A;   ESOPHAGOGASTRODUODENOSCOPY (EGD) WITH PROPOFOL N/A 01/09/2015   Procedure: ESOPHAGOGASTRODUODENOSCOPY (EGD) WITH PROPOFOL;  Surgeon: Midge Minium, MD;  Location: [facility] ENDOSCOPY;  Service: Endoscopy;  Laterality: N/A;   OOPHORECTOMY Right 2013   REDUCTION MAMMAPLASTY     Past Medical History:  Diagnosis Date   Arthritis    Breast cancer (HCC)    left breast - chemo and radiation   Diabetes mellitus without complication (HCC)    Hypertension    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   Panic attacks    Personal history of chemotherapy    Personal history of radiation therapy    Sleep apnea    BP 106/72   Pulse 94   Ht 5\' 5"  (1.651 m)   Wt (!) 137.4 kg   LMP  (LMP Unknown)   SpO2 97%   BMI 50.42 kg/m   Opioid Risk Score:   Fall Risk Score:  `1  Depression screen [facility] 2/9     05/21/2023   11:06 AM 03/27/2023    8:30 AM 03/11/2023    2:33 PM 02/27/2023   11:02 AM 01/29/2023    1:03 PM 11/27/2022    1:15 PM 11/13/2022    9:04 AM  Depression screen PHQ 2/9  Decreased Interest 1 1 1  0 0  2 1  Down, Depressed, Hopeless 2 1 1  0 0 1 1  PHQ - 2 Score 3 2 2  0 0 3 2  Altered sleeping 2 2 1    1    Tired, decreased energy 2 2 3    1   Change in appetite 1 1 0    1  Feeling bad or failure about yourself  1 1 1    2   Trouble concentrating 1 2 1    1   Moving slowly or fidgety/restless 0 1 0    0  Suicidal thoughts 0 0 0    0  PHQ-9 Score 10 11 8    8   Difficult doing work/chores Somewhat difficult Very difficult Somewhat difficult    Very difficult      Review of Systems  Musculoskeletal:  Positive for back pain and gait problem.  All other systems reviewed and are negative.      Objective:   Physical Exam     Gen: no distress, normal appearing, obese HEENT: oral mucosa pink and moist, NCAT Chest: normal effort, normal rate of breathing Abd: soft, non-distended Psych: pleasant, normal affect Skin: intact Neuro: Alert and awake, follows commands, cranial nerves II through XII grossly intact, normal speech and language No focal motor or sensory deficits noted Musculoskeletal:  TTP at joint line bilateral knees, more anterior and medial No significant lower back or paraspinal tenderness noted Bilateral knee joint crepitus noted with ROM Mild pain with PROM of both knees Antalgic gait No knee joint redness or sign of infection noted       R knee xray 11/13/22 IMPRESSION: 1. Severe 3 compartmental osteoarthritis, with trace reactive suprapatellar joint effusion. 2. No acute fracture.    Assessment & Plan:  Bilateral knee OA -Opiate risk tool moderate -Previously on Xtampza and oxycodone as needed at different pain clinic -Advised to request records from [facility]- pending -ETOH on prior UDS- discussed DC and she agrees. This UDS was prior to her starting on oxycodone -Monitor routine pill counts, PDMP, UDS -Could consider gel injection for referral for genicular nerve block -She reports benefit with knee brace and TENS unit -Continue Percocet  5 mg 3 times daily as needed -Warning letter sent previously for not bringing in pain medication.   -It  has been over a month since she refilled her medication, she did not bring in pill bottle today.  Patient says since she is due for refill she did not know she needs to bring this in.  Final warning today.  Will not change to nonnarcotic status today as it has been over a month and it would be reasonable for her to be out of medications. -Will order 20-day supply, will see in 2 weeks and recheck at that time -Scheduled for Zilretta injections in 2 weeks   muscles spasms L arm  -Improved, continue Magnesium supplementation   Morbid obesity -She was on Ozempic-- now says she is on Mongero -Reports she is losing weight -Provided list of medications for chronic pain and weight loss prior visit

## 2023-06-04 ENCOUNTER — Ambulatory Visit: Payer: 59 | Admitting: Nurse Practitioner

## 2023-06-05 ENCOUNTER — Other Ambulatory Visit: Payer: Self-pay | Admitting: Nurse Practitioner

## 2023-06-05 DIAGNOSIS — I10 Essential (primary) hypertension: Secondary | ICD-10-CM

## 2023-06-10 ENCOUNTER — Ambulatory Visit: Payer: 59 | Admitting: Nurse Practitioner

## 2023-06-11 ENCOUNTER — Encounter: Payer: Self-pay | Admitting: Physical Medicine & Rehabilitation

## 2023-06-11 ENCOUNTER — Encounter: Payer: 59 | Admitting: Physical Medicine & Rehabilitation

## 2023-06-11 ENCOUNTER — Encounter: Payer: 59 | Attending: Physical Medicine & Rehabilitation | Admitting: Physical Medicine & Rehabilitation

## 2023-06-11 VITALS — BP 100/66 | HR 82 | Wt 303.0 lb

## 2023-06-11 DIAGNOSIS — G894 Chronic pain syndrome: Secondary | ICD-10-CM | POA: Insufficient documentation

## 2023-06-11 DIAGNOSIS — M170 Bilateral primary osteoarthritis of knee: Secondary | ICD-10-CM | POA: Insufficient documentation

## 2023-06-11 DIAGNOSIS — Z5181 Encounter for therapeutic drug level monitoring: Secondary | ICD-10-CM | POA: Insufficient documentation

## 2023-06-11 MED ORDER — TRIAMCINOLONE ACETONIDE 32 MG IX SRER
64.0000 mg | Freq: Once | INTRA_ARTICULAR | Status: AC
  Administered 2023-06-11: 64 mg via INTRA_ARTICULAR

## 2023-06-11 MED ORDER — OXYCODONE-ACETAMINOPHEN 5-325 MG PO TABS: 1.0000 | ORAL_TABLET | Freq: Three times a day (TID) | ORAL | 0 refills | Status: DC | PRN

## 2023-06-11 NOTE — Progress Notes (Addendum)
 Subjective:    Patient ID: Sherry Price, female    DOB: [birth year], 53 y.o.   MRN: [id number]  HPI  Sherry Price is a 53 y.o. 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   They are presenting to PM&R clinic as a new patient for pain management evaluation. They were referred by Augustin Glance for bilateral knee pain.  Her pain is 7-8 out of 10 worsens with walking.  Patient reports she has had bilateral knee pain for many years.  Pain is worsened with activity.  Patient reports occasional right back pain mostly when ambulating longer distances.  Furthermore she also has pain due to muscle spasms in her left arm.  She thinks this is related to prior treatment of cancer of her left breast.  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  She typically walks without a cane for short distances but uses a walker for longer distances.   She has been seen by [facility] sports medicine.  She has received cortisone shots in her knees with benefit.  Cortisone does help her knee pain bilaterally however she feels like she is still very limited by her pain.  She reports her last knee injections were in March.  She says she had gel injections years ago  rooster comb and is not sure how these helped.  She did not continue these injections regularly at the time.   She reports previously using oxycodone /Xtampza  from [facility].  Patient says she discontinued seeing [facility] because they used a different insurance for 1 visit resulting in a $300 bill.  As she was unable to pay this bill she was unable to continue following with [facility] medical.  She feels like  this provided her improved pain control and she was able to walk a lot further.      Red flag symptoms: No red flags for back pain endorsed in Hx or ROS   Medications tried:   Topical medications  Nsaids- ibuprofen - helps slightly, she is currently using Tylenol   -did not help much in the past Opiates  Oyxoconde and xtamaza -were helping control her pain until he would discontinued several months ago Tramadol- didn't help Hydrocodone- She doesn't recall Gabapentin / Lyrica - Denies use TCAs  - Denies use SNRIs -currently on venlafaxine      Other treatments: Knee braces help Sherry Price- few months ago, helped her pain  TENs unit- Denies  Injections- Gel injections? Vs cortisone didn't help- tried years ago , cortisone helps - last in march  Surgery- limted by wt, taking ozempic       Interval History 11/27/22 Sherry Price is here for bilateral cortisone knee injections.  She reports she has not had cortisone injection to her knees 3 months.  Reports cortisone injections were previously helpful to her pain.  She continues to work on losing weight.  Knee pain continues to be severe and limiting.  Oxycodone  ordered this week however she has not picked this up yet.  We discussed EtOH noted on prior UDS, she was not on oxycodone  at the time.  She agrees to discontinue use of alcohol since restarting this medication.   Interval History 01/29/23 Sherry Price is here for  follow-up regarding her chronic knee pain.  Reports that knee brace and TENS unit are providing some benefit.  She went on a cruise earlier this month had a lot of increased pain due to activities.  She feels that she cannot do as much as she would like to do due to her knee pain.  Oxycodone  is helping but not lasting long enough.  Knee cortisone injection helped but did not last long enough.  She feels like these lasted just a few weeks.  Interval History 02/27/23 Sherry Price is here for b/l zilretta  injections today.  It has been 3 months since  her last cortisone knee injections.  She reports her knees continue to cause significant pain and limiting her function.  Percocet 5 mg 3 times daily does help reduce her pain and keep her more comfortable.  She is not having any side effects of this medication.  Interval History 04/29/23 Patient is here for follow-up regarding her chronic knee pain.  She reports its better since Zilretta  injections.  She feels like these are helping a lot more than short acting cortisone did.  Injection still helping but it is gradually decreasing.  Percocet is also helping keep her pain at a more tolerable level, no side effects with the medication.  She continues to work on weight loss.   Interval history 05/28/2023 Patient reports the prior bilateral Zilretta  injections were helpful but have worn off.  She would like a repeat injection for both of her knees.  Percocet has been helping to keep her pain at a more tolerable level.  No side effects with the medication.  Interval history 06/11/23 Patient reports worsening pain in her bilateral knees.  She is here for bilateral knee Zilretta  repeat injection today.  She feels like the cold weather has been worsening her knee pain as well.  She continues to use Percocet 5 mg that helps reduce her pain and allows her to be more functional.  She reports having hinged knee braces at home, she plans to also try a knee sleeve.  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  Pain Inventory Average Pain 7 Pain Right Now 7 My pain is aching  In the last 24 hours, has pain interfered with the following? General activity 6 Relation with others 4 Enjoyment of life 6 What TIME of day is your pain at its worst? morning , daytime, evening, and night Sleep (in general) Fair  Pain is worse with: walking and standing Pain improves with: rest, pacing activities, medication, and injections Relief from Meds: 6  Family History  Problem Relation Age of Onset    Depression Mother    Hypertension Mother    Heart disease Mother    Diabetes Mother    Alcohol abuse Father    Breast cancer Cousin    Social History   Socioeconomic History   Marital status: Single    Spouse name: Not on file   Number of children: Not on file   Years of education: Not on file   Highest education level: Not on file  Occupational History   Not on file  Tobacco Use   Smoking status: Former    Types: Cigarettes   Smokeless tobacco: Never  Vaping Use   Vaping status: Never Used  Substance and Sexual Activity   Alcohol use: Yes    Comment: occasionally   Drug use: No   Sexual activity: Not on file  Other Topics Concern   Not on file  Social History Narrative   Never married   Social Drivers of Corporate Investment Banker Strain: Low Risk  (03/11/2023)   Overall Financial Resource Strain (CARDIA)    Difficulty of Paying Living Expenses: Not hard at all  Food Insecurity: No Food Insecurity (03/11/2023)   Hunger Vital Sign    Worried About Running Out of Food in the Last Year: Never true    Ran Out of Food in the Last Year: Never true  Transportation Needs: No Transportation Needs (03/11/2023)   PRAPARE - Administrator, Civil Service (Medical): No    Lack of Transportation (Non-Medical): No  Physical Activity: Inactive (03/11/2023)   Exercise Vital Sign    Days of Exercise per Week: 0 days    Minutes of Exercise per Session: 0 min  Stress: Stress Concern Present (03/11/2023)   [employer] of Occupational Health - Occupational Stress Questionnaire    Feeling of Stress : Rather much  Social Connections: Moderately Isolated (03/11/2023)   Social Connection and Isolation Panel [NHANES]    Frequency of Communication with Friends and Family: More than three times a week    Frequency of Social Gatherings with Friends and Family: Once a week    Attends Religious Services: More than 4 times per year    Active Member of [employer] or Organizations: No     Attends Banker Meetings: Never    Marital Status: Never married   Past Surgical History:  Procedure Laterality Date   BREAST BIOPSY Left 2013   +   BREAST BIOPSY Right    neg   BREAST LUMPECTOMY  2013   BREAST MAMMOSITE Left February 2013   BREAST REDUCTION SURGERY  2005   COLONOSCOPY WITH PROPOFOL  N/A 01/09/2015   Procedure: COLONOSCOPY WITH PROPOFOL ;  Surgeon: Rogelia Copping, MD;  Location: [facility] ENDOSCOPY;  Service: Endoscopy;  Laterality: N/A;   ESOPHAGOGASTRODUODENOSCOPY (EGD) WITH PROPOFOL  N/A 01/09/2015   Procedure: ESOPHAGOGASTRODUODENOSCOPY (EGD) WITH PROPOFOL ;  Surgeon: Rogelia Copping, MD;  Location: [facility] ENDOSCOPY;  Service: Endoscopy;  Laterality: N/A;   OOPHORECTOMY Right 2013   REDUCTION MAMMAPLASTY     Past Surgical History:  Procedure Laterality Date   BREAST BIOPSY Left 2013   +   BREAST BIOPSY Right    neg   BREAST LUMPECTOMY  2013   BREAST MAMMOSITE Left February 2013   BREAST REDUCTION SURGERY  2005   COLONOSCOPY WITH PROPOFOL  N/A 01/09/2015   Procedure: COLONOSCOPY WITH PROPOFOL ;  Surgeon: Rogelia Copping, MD;  Location: [facility] ENDOSCOPY;  Service: Endoscopy;  Laterality: N/A;   ESOPHAGOGASTRODUODENOSCOPY (EGD) WITH PROPOFOL  N/A 01/09/2015   Procedure: ESOPHAGOGASTRODUODENOSCOPY (EGD) WITH PROPOFOL ;  Surgeon: Rogelia Copping, MD;  Location: [facility] ENDOSCOPY;  Service: Endoscopy;  Laterality: N/A;   OOPHORECTOMY Right 2013   REDUCTION MAMMAPLASTY     Past Medical History:  Diagnosis Date   Arthritis    Breast cancer (HCC)    left breast - chemo and radiation   Diabetes mellitus without complication (HCC)    Hypertension    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   Panic attacks    Personal history of chemotherapy    Personal history of radiation therapy    Sleep apnea    Wt (!) 303 lb (137.4 kg)   LMP  (LMP Unknown)    BMI 50.42 kg/m   Opioid Risk Score:  Fall Risk Score:  `1  Depression screen [facility] 2/9     06/11/2023   10:54 AM 05/21/2023   11:06 AM 03/27/2023    8:30 AM 03/11/2023    2:33 PM 02/27/2023   11:02 AM 01/29/2023    1:03 PM 11/27/2022    1:15 PM  Depression screen PHQ 2/9  Decreased Interest 1 1 1 1  0 0 2  Down, Depressed, Hopeless 1 2 1 1  0 0 1  PHQ - 2 Score 2 3 2 2  0 0 3  Altered sleeping  2 2 1      Tired, decreased energy  2 2 3      Change in appetite  1 1 0     Feeling bad or failure about yourself   1 1 1      Trouble concentrating  1 2 1      Moving slowly or fidgety/restless  0 1 0     Suicidal thoughts  0 0 0     PHQ-9 Score  10 11 8      Difficult doing work/chores  Somewhat difficult Very difficult Somewhat difficult         Review of Systems  Musculoskeletal:  Positive for back pain and gait problem.  All other systems reviewed and are negative.      Objective:   Physical Exam     Gen: no distress, normal appearing, obese HEENT: oral mucosa pink and moist, NCAT Chest: normal effort, normal rate of breathing Abd: soft, non-distended Psych: pleasant, normal affect Skin: intact Neuro: Alert and awake, follows commands, cranial nerves II through XII grossly intact, normal speech and language No focal motor or sensory deficits noted Musculoskeletal:  TTP at joint line bilateral knees, more anterior and medial No significant lower back or paraspinal tenderness noted Bilateral knee joint crepitus noted with ROM Knee swelling right greater than left Mild pain with PROM of both knees Antalgic gait No knee joint redness or sign of infection noted       R knee xray 11/13/22 IMPRESSION: 1. Severe 3 compartmental osteoarthritis, with trace reactive suprapatellar joint effusion. 2. No acute fracture.    Assessment & Plan:  Bilateral knee OA -Opiate risk tool moderate -Previously on Xtampza  and oxycodone  as needed at different pain clinic -Advised to  request records from [facility]- pending -ETOH on prior UDS- discussed DC and she agrees. This UDS was prior to her starting on oxycodone  -Monitor routine pill counts, PDMP, UDS -Could consider gel injection for referral for genicular nerve block if poor results -She reports benefit with knee brace and TENS unit -Continue Percocet  5 mg 3 times daily as needed -Warning letter sent previously for not bringing in pain medication.  Patient brought in bottle today-she reports she got some water that dripped in the pill bottle resulting in some of the pills showing evidence of this -Discussed that any further forgetting of pills or inconsistent pill counts, will be non opioid status, will do monthly visits for now -Consider trying Butrans  patch -Patient would like to avoid knee replacement    Knee injection Indication:b/l Knee pain not relieved by medication management and other conservative care.   Informed consent was obtained after describing risks and benefits of the procedure with the patient, this includes bleeding, bruising, infection and medication side effects. The patient wishes to proceed and has given written consent. The patient was placed in a recumbent position. The lateral aspect of the knee was marked and prepped with Betadine and alcohol.  It was then entered with a 21-gauge 1-1/2 inch needle after negative draw back for blood, a solution containing Ziltretta was injected. The patient tolerated the procedure well. This was done on both knees. Post procedure instructions were given.   Muscles spasms L arm  -Improved, continue Magnesium supplementation   Morbid obesity -She was on Ozempic -- now says she is on Mongero -She says she is still working on losing weight -Provided list of medications for chronic pain and weight loss prior visit

## 2023-06-12 DIAGNOSIS — M170 Bilateral primary osteoarthritis of knee: Secondary | ICD-10-CM | POA: Diagnosis not present

## 2023-06-12 MED ORDER — TRIAMCINOLONE ACETONIDE 32 MG IX SRER
64.0000 mg | Freq: Once | INTRA_ARTICULAR | Status: AC
  Administered 2023-06-12: 64 mg via INTRA_ARTICULAR

## 2023-06-12 NOTE — Addendum Note (Signed)
 Addended by: Josselyn Harkins W on: 06/12/2023 05:48 PM   Modules accepted: Orders

## 2023-06-19 ENCOUNTER — Encounter: Payer: Self-pay | Admitting: Nurse Practitioner

## 2023-06-19 ENCOUNTER — Ambulatory Visit: Payer: 59 | Admitting: Nurse Practitioner

## 2023-06-19 VITALS — BP 138/80 | HR 77 | Temp 98.0°F | Ht 65.0 in | Wt 298.4 lb

## 2023-06-19 DIAGNOSIS — L989 Disorder of the skin and subcutaneous tissue, unspecified: Secondary | ICD-10-CM

## 2023-06-19 DIAGNOSIS — I10 Essential (primary) hypertension: Secondary | ICD-10-CM | POA: Diagnosis not present

## 2023-06-19 DIAGNOSIS — Z6841 Body Mass Index (BMI) 40.0 and over, adult: Secondary | ICD-10-CM

## 2023-06-19 DIAGNOSIS — R399 Unspecified symptoms and signs involving the genitourinary system: Secondary | ICD-10-CM

## 2023-06-19 DIAGNOSIS — F419 Anxiety disorder, unspecified: Secondary | ICD-10-CM | POA: Diagnosis not present

## 2023-06-19 DIAGNOSIS — F32A Depression, unspecified: Secondary | ICD-10-CM

## 2023-06-19 LAB — URINALYSIS, ROUTINE W REFLEX MICROSCOPIC
Hgb urine dipstick: NEGATIVE
Ketones, ur: 15 — AB
Nitrite: NEGATIVE
Specific Gravity, Urine: 1.025 (ref 1.000–1.030)
Total Protein, Urine: 30 — AB
Urine Glucose: NEGATIVE
Urobilinogen, UA: 0.2 (ref 0.0–1.0)
pH: 6.5 (ref 5.0–8.0)

## 2023-06-19 LAB — POC URINALSYSI DIPSTICK (AUTOMATED)
Blood, UA: NEGATIVE
Glucose, UA: NEGATIVE
Ketones, UA: 15 — AB
Nitrite, UA: NEGATIVE
Protein, UA: POSITIVE — AB
Spec Grav, UA: 1.025 (ref 1.010–1.025)
Urobilinogen, UA: 1 U/dL
pH, UA: 6.5 (ref 5.0–8.0)

## 2023-06-19 MED ORDER — AMOXICILLIN 500 MG PO CAPS: 500.0000 mg | ORAL_CAPSULE | Freq: Three times a day (TID) | ORAL | 0 refills | Status: AC

## 2023-06-19 NOTE — Assessment & Plan Note (Signed)
Currently on Norvasc 10 mg daily, Hydrochlorothiazide 12.5 mg daily, and Labetalol 200 mg twice daily, with recent variable blood pressure readings. Continue current medications and monitor blood pressure.

## 2023-06-19 NOTE — Assessment & Plan Note (Signed)
Continued weight loss has been reported, recently reaching a goal. Encourage continuation of current weight loss strategies. Continue Mounjaro 15 mg weekly.

## 2023-06-19 NOTE — Assessment & Plan Note (Signed)
See anxiety plan. Follow up with Psychiatry and re-establish with therapist/counselor.

## 2023-06-19 NOTE — Progress Notes (Signed)
 Bethanie Dicker, NP-C Phone: [phone]  Sherry Price is a 53 y.o. female who presents today for follow up.   Discussed the use of AI scribe software for clinical note transcription with the patient, who gave verbal consent to proceed.  History of Present Illness   Sherry Price is a 54 year old female with hypertension who presents with persistent urinary symptoms and a wound concern.  She has a history of urinary tract infection and was prescribed Keflex last month. Her urine is dark, but there is no pain or burning sensation. She acknowledges reduced fluid intake, which she believes may contribute to the dark urine. She is concerned about the duration of the infection and mentions the possibility of needing a higher dose of antibiotics. No increase in urinary frequency.  She has a wound that is not painful but concerning due to its location in the gluteal cleft and appearance. She suspects it might be related to weight loss and friction. The wound was previously cultured and found to be positive for bacteria. She has been using a cream but is currently out of it.  She experiences nervous shaking and restlessness, particularly after returning home from work. She finds it difficult to 'shut down' and often wakes up at 2 AM. She mentions having a large meal after work and feels she is not consuming enough water, which she attributes to a lack of time.  She is on multiple medications for hypertension, including Norvasc, hydrochlorothiazide, and Labetalol. Her blood pressure has been fluctuating, with recent stress contributing to higher readings. She is also on Mounjaro for weight management and has successfully lost weight, reaching her first goal of 300 pounds. She plans to continue weight loss efforts with a goal now of 270.   She describes experiencing stress and mood fluctuations, feeling criticized, and dealing with past psychological issues related to her family history. She has  not been able to continue therapy sessions after the initial appointment and acknowledges the need to resume therapy.      Social History   Tobacco Use  Smoking Status Former   Types: Cigarettes  Smokeless Tobacco Never    Current Outpatient Medications on File Prior to Visit  Medication Sig Dispense Refill   ALPRAZolam (XANAX) 0.5 MG tablet TAKE 1 TABLET(0.5 MG) BY MOUTH DAILY AS NEEDED FOR ANXIETY 30 tablet 5   amLODipine (NORVASC) 10 MG tablet TAKE 1 TABLET(10 MG) BY MOUTH DAILY 90 tablet 3   Aspirin-Acetaminophen-Caffeine (GOODY HEADACHE PO) Take 1 packet by mouth as needed.     atorvastatin (LIPITOR) 40 MG tablet Take 40 mg by mouth daily.     calcium carbonate (TUMS) 500 MG chewable tablet Chew 1 tablet (200 mg of elemental calcium total) by mouth 2 (two) times daily. 20 tablet 0   hydrochlorothiazide (HYDRODIURIL) 25 MG tablet TAKE 1/2 TABLET(12.5 MG) BY MOUTH DAILY 45 tablet 3   ibuprofen (ADVIL) 200 MG tablet Take by mouth.     labetalol (NORMODYNE) 200 MG tablet TAKE 1 TABLET(200 MG) BY MOUTH TWICE DAILY 180 tablet 3   lidocaine (XYLOCAINE) 1 % (with preservative) injection by Infiltration route.     mupirocin ointment (BACTROBAN) 2 % Apply 1 Application topically 2 (two) times daily. 22 g 0   naproxen (NAPROSYN) 500 MG tablet Take 1 tablet (500 mg total) by mouth 2 (two) times daily with a meal. 60 tablet 2   ondansetron (ZOFRAN-ODT) 4 MG disintegrating tablet DISSOLVE 1 TO 2 TABLETS ON THE TONGUE EVERY  8 HOURS AS NEEDED FOR NAUSEA OR VOMITING 30 tablet 2   oxyCODONE-acetaminophen (PERCOCET) 5-325 MG tablet Take 1 tablet by mouth every 8 (eight) hours as needed for severe pain (pain score 7-10). 60 tablet 0   tirzepatide (MOUNJARO) 12.5 MG/0.5ML Pen Inject 12.5 mg into the skin once a week. 2 mL 0   venlafaxine XR (EFFEXOR-XR) 150 MG 24 hr capsule Take 1 capsule (150 mg total) by mouth daily with breakfast. 90 capsule 3   No current facility-administered medications on file  prior to visit.    ROS see history of present illness  Objective  Physical Exam Vitals:   06/19/23 0950  BP: 138/80  Pulse: 77  Temp: 98 F (36.7 C)  SpO2: 95%    BP Readings from Last 3 Encounters:  06/19/23 138/80  06/11/23 100/66  05/28/23 106/72   Wt Readings from Last 3 Encounters:  06/19/23 298 lb 6.4 oz (135.4 kg)  06/11/23 (!) 303 lb (137.4 kg)  05/28/23 (!) 303 lb (137.4 kg)    Physical Exam Constitutional:      General: She is not in acute distress.    Appearance: Normal appearance.  HENT:     Head: Normocephalic.  Cardiovascular:     Rate and Rhythm: Normal rate and regular rhythm.     Heart sounds: Normal heart sounds.  Pulmonary:     Effort: Pulmonary effort is normal.     Breath sounds: Normal breath sounds.  Skin:    General: Skin is warm and dry.     Findings: Lesion (gluteal cleft, drainage present) present.  Neurological:     General: No focal deficit present.     Mental Status: She is alert.  Psychiatric:        Mood and Affect: Mood normal.        Behavior: Behavior normal.    Assessment/Plan: Please see individual problem list.  Skin lesion Assessment & Plan: Located in gluteal cleft, lesion persists with a positive bacterial culture, though no pain is reported. Start Amoxicillin 500 mg TID x 7 days. Keep the area clean and dry, and consider using a non-stick pad for protection. Follow up in 1-2 weeks to monitor healing. Strict precautions given to patient.   Orders: -     Amoxicillin; Take 1 capsule (500 mg total) by mouth 3 (three) times daily for 7 days.  Dispense: 21 capsule; Refill: 0  UTI symptoms Assessment & Plan: Dark urine is reported without dysuria or increased frequency, and trace leukocytes are present in the urine. Previously treated with Keflex will check for resolution of infection. Microscopic and culture pending. Advised increased water intake.  Orders: -     Urinalysis, Routine w reflex microscopic -     Urine  Culture -     POCT Urinalysis Dipstick (Automated)  Benign essential hypertension Assessment & Plan: Currently on Norvasc 10 mg daily, Hydrochlorothiazide 12.5 mg daily, and Labetalol 200 mg twice daily, with recent variable blood pressure readings. Continue current medications and monitor blood pressure.   Anxiety Assessment & Plan: There are reports of stress, restlessness, and difficulty with mood regulation. Encourage re-engagement with therapy and consider counseling for PTSD. Continue Xanax, Wellbutrin XL and Effexor XR. PHQ- 14 and GAD- 7 today. Follow up with Psychiatry.   Depressive disorder Assessment & Plan: See anxiety plan. Follow up with Psychiatry and re-establish with therapist/counselor.    Body mass index (BMI) of 50-59.9 in adult [facility]) Assessment & Plan: Continued weight loss has been reported,  recently reaching a goal. Encourage continuation of current weight loss strategies. Continue Mounjaro 15 mg weekly.      Return in about 1 week (around 06/26/2023) for Follow up.   Bethanie Dicker, NP-C [facility]

## 2023-06-19 NOTE — Assessment & Plan Note (Signed)
Dark urine is reported without dysuria or increased frequency, and trace leukocytes are present in the urine. Previously treated with Keflex will check for resolution of infection. Microscopic and culture pending. Advised increased water intake.

## 2023-06-19 NOTE — Assessment & Plan Note (Addendum)
There are reports of stress, restlessness, and difficulty with mood regulation. Encourage re-engagement with therapy and consider counseling for PTSD. Continue Xanax, Wellbutrin XL and Effexor XR. PHQ- 14 and GAD- 7 today. Follow up with Psychiatry.

## 2023-06-19 NOTE — Assessment & Plan Note (Signed)
Located in gluteal cleft, lesion persists with a positive bacterial culture, though no pain is reported. Start Amoxicillin 500 mg TID x 7 days. Keep the area clean and dry, and consider using a non-stick pad for protection. Follow up in 1-2 weeks to monitor healing. Strict precautions given to patient.

## 2023-06-21 LAB — URINE CULTURE
MICRO NUMBER:: 16086267
Result:: NO GROWTH
SPECIMEN QUALITY:: ADEQUATE

## 2023-06-25 ENCOUNTER — Other Ambulatory Visit: Payer: Self-pay | Admitting: Nurse Practitioner

## 2023-06-25 MED ORDER — MUPIROCIN 2 % EX OINT: 1.0000 | TOPICAL_OINTMENT | Freq: Two times a day (BID) | CUTANEOUS | 0 refills | Status: DC

## 2023-07-02 ENCOUNTER — Encounter: Payer: Self-pay | Admitting: Nurse Practitioner

## 2023-07-02 ENCOUNTER — Ambulatory Visit (INDEPENDENT_AMBULATORY_CARE_PROVIDER_SITE_OTHER): Payer: 59 | Admitting: Nurse Practitioner

## 2023-07-02 VITALS — BP 136/82 | HR 85 | Temp 97.7°F | Ht 65.0 in | Wt 296.0 lb

## 2023-07-02 DIAGNOSIS — R8290 Unspecified abnormal findings in urine: Secondary | ICD-10-CM

## 2023-07-02 DIAGNOSIS — L989 Disorder of the skin and subcutaneous tissue, unspecified: Secondary | ICD-10-CM

## 2023-07-02 LAB — URINALYSIS, ROUTINE W REFLEX MICROSCOPIC
Hgb urine dipstick: NEGATIVE
Nitrite: NEGATIVE
RBC / HPF: NONE SEEN (ref 0–?)
Specific Gravity, Urine: >=1.03 — AB (ref 1.000–1.030)
Total Protein, Urine: 30 — AB
Urine Glucose: NEGATIVE
Urobilinogen, UA: 1 (ref 0.0–1.0)
pH: 6 (ref 5.0–8.0)

## 2023-07-02 NOTE — Assessment & Plan Note (Signed)
 The lesion, located in the gluteal cleft, has not improved despite Amoxicillin and topical antibiotic cream. There are no signs of systemic infection, but the lesion appears moist and potentially deeper. Discontinue the topical antibiotic cream. Keep the area clean and dry with soap and water. Apply a non-stick pad to the area. Refer to wound care for further evaluation and management. Return in 2 weeks to monitor progress.

## 2023-07-02 NOTE — Progress Notes (Signed)
 Bethanie Dicker, NP-C Phone: [phone]  Sherry Price is a 53 y.o. female who presents today for wound check.   Discussed the use of AI scribe software for clinical note transcription with the patient, who gave verbal consent to proceed.  History of Present Illness   Sherry Price is a 53 year old female who presents with a non-healing skin lesion on her bottom.  The lesion in her gluteal cleft has persisted despite completing a course of amoxicillin and using an antibiotic cream. It remains non-healing but is not painful, and there has been no noticeable worsening. She has not been packing the wound, mentioning she forgot. The cream keeps the area wet and gooey, possibly due to sweating. She has been using Dial soap for cleaning and inquires about the use of peroxide.  No fevers or chills have been experienced.     Social History   Tobacco Use  Smoking Status Former   Types: Cigarettes  Smokeless Tobacco Never    Current Outpatient Medications on File Prior to Visit  Medication Sig Dispense Refill   ALPRAZolam (XANAX) 0.5 MG tablet TAKE 1 TABLET(0.5 MG) BY MOUTH DAILY AS NEEDED FOR ANXIETY 30 tablet 5   amLODipine (NORVASC) 10 MG tablet TAKE 1 TABLET(10 MG) BY MOUTH DAILY 90 tablet 3   Aspirin-Acetaminophen-Caffeine (GOODY HEADACHE PO) Take 1 packet by mouth as needed.     atorvastatin (LIPITOR) 40 MG tablet Take 40 mg by mouth daily.     calcium carbonate (TUMS) 500 MG chewable tablet Chew 1 tablet (200 mg of elemental calcium total) by mouth 2 (two) times daily. 20 tablet 0   hydrochlorothiazide (HYDRODIURIL) 25 MG tablet TAKE 1/2 TABLET(12.5 MG) BY MOUTH DAILY 45 tablet 3   ibuprofen (ADVIL) 200 MG tablet Take by mouth.     labetalol (NORMODYNE) 200 MG tablet TAKE 1 TABLET(200 MG) BY MOUTH TWICE DAILY 180 tablet 3   lidocaine (XYLOCAINE) 1 % (with preservative) injection by Infiltration route.     mupirocin ointment (BACTROBAN) 2 % Apply 1 Application topically 2  (two) times daily. 22 g 0   naproxen (NAPROSYN) 500 MG tablet Take 1 tablet (500 mg total) by mouth 2 (two) times daily with a meal. 60 tablet 2   ondansetron (ZOFRAN-ODT) 4 MG disintegrating tablet DISSOLVE 1 TO 2 TABLETS ON THE TONGUE EVERY 8 HOURS AS NEEDED FOR NAUSEA OR VOMITING 30 tablet 2   oxyCODONE-acetaminophen (PERCOCET) 5-325 MG tablet Take 1 tablet by mouth every 8 (eight) hours as needed for severe pain (pain score 7-10). 60 tablet 0   tirzepatide (MOUNJARO) 12.5 MG/0.5ML Pen Inject 12.5 mg into the skin once a week. 2 mL 0   venlafaxine XR (EFFEXOR-XR) 150 MG 24 hr capsule Take 1 capsule (150 mg total) by mouth daily with breakfast. 90 capsule 3   No current facility-administered medications on file prior to visit.    ROS see history of present illness  Objective  Physical Exam Vitals:   07/02/23 0836  BP: 136/82  Pulse: 85  Temp: 97.7 F (36.5 C)  SpO2: 97%    BP Readings from Last 3 Encounters:  07/02/23 136/82  06/19/23 138/80  06/11/23 100/66   Wt Readings from Last 3 Encounters:  07/02/23 296 lb (134.3 kg)  06/19/23 298 lb 6.4 oz (135.4 kg)  06/11/23 (!) 303 lb (137.4 kg)    Physical Exam Constitutional:      General: She is not in acute distress.    Appearance: Normal appearance.  HENT:     Head: Normocephalic.  Cardiovascular:     Rate and Rhythm: Normal rate and regular rhythm.     Heart sounds: Normal heart sounds.  Pulmonary:     Effort: Pulmonary effort is normal.     Breath sounds: Normal breath sounds.  Skin:    General: Skin is warm and dry.     Findings: Lesion (gluteal cleft, drainage present) present.  Neurological:     General: No focal deficit present.     Mental Status: She is alert.  Psychiatric:        Mood and Affect: Mood normal.        Behavior: Behavior normal.    Assessment/Plan: Please see individual problem list.  Skin lesion Assessment & Plan: The lesion, located in the gluteal cleft, has not improved despite  Amoxicillin and topical antibiotic cream. There are no signs of systemic infection, but the lesion appears moist and potentially deeper. Discontinue the topical antibiotic cream. Keep the area clean and dry with soap and water. Apply a non-stick pad to the area. Refer to wound care for further evaluation and management. Return in 2 weeks to monitor progress.   Orders: -     Ambulatory referral to Wound Clinic  Abnormal urine findings -     Urinalysis, Routine w reflex microscopic    Return in about 2 weeks (around 07/16/2023) for Follow up.   Bethanie Dicker, NP-C [facility]

## 2023-07-09 ENCOUNTER — Telehealth: Payer: Self-pay

## 2023-07-09 NOTE — Telephone Encounter (Signed)
Fax has not been received as of yet.

## 2023-07-09 NOTE — Telephone Encounter (Signed)
 Copied from CRM [id number]. Topic: General - Other >> Jul 09, 2023  2:24 PM Truddie Crumble wrote: Reason for CRM: tianyi from [employer] called stating she faxed a form over called a deprescribing recommendation request to stop the benzodiazepine and she has not received the form back yet. Hughes Better stated she is faxing the form over again today

## 2023-07-13 ENCOUNTER — Telehealth: Payer: Self-pay | Admitting: Physical Medicine & Rehabilitation

## 2023-07-13 NOTE — Telephone Encounter (Signed)
 Patient requesting Oxycodone please send to [facility] [address] [place]

## 2023-07-15 NOTE — Telephone Encounter (Signed)
 Form has been faxed to [phone] with a completed transmission log

## 2023-07-16 ENCOUNTER — Ambulatory Visit: Payer: 59 | Admitting: Nurse Practitioner

## 2023-07-16 MED ORDER — OXYCODONE-ACETAMINOPHEN 5-325 MG PO TABS: 1.0000 | ORAL_TABLET | Freq: Three times a day (TID) | ORAL | 0 refills | Status: DC | PRN

## 2023-07-24 ENCOUNTER — Encounter: Payer: 59 | Admitting: Physical Medicine & Rehabilitation

## 2023-07-27 ENCOUNTER — Encounter: Admitting: Physical Medicine & Rehabilitation

## 2023-08-06 ENCOUNTER — Encounter: Attending: Physical Medicine & Rehabilitation | Admitting: Physical Medicine & Rehabilitation

## 2023-08-06 ENCOUNTER — Encounter: Payer: Self-pay | Admitting: Physical Medicine & Rehabilitation

## 2023-08-06 VITALS — BP 112/80 | HR 87 | Ht 65.0 in | Wt 289.0 lb

## 2023-08-06 DIAGNOSIS — Z5181 Encounter for therapeutic drug level monitoring: Secondary | ICD-10-CM

## 2023-08-06 DIAGNOSIS — G894 Chronic pain syndrome: Secondary | ICD-10-CM | POA: Diagnosis present

## 2023-08-06 DIAGNOSIS — M170 Bilateral primary osteoarthritis of knee: Secondary | ICD-10-CM | POA: Diagnosis not present

## 2023-08-06 MED ORDER — OXYCODONE-ACETAMINOPHEN 5-325 MG PO TABS: 1.0000 | ORAL_TABLET | Freq: Three times a day (TID) | ORAL | 0 refills | Status: DC | PRN

## 2023-08-06 MED ORDER — BUPRENORPHINE 5 MCG/HR TD PTWK: 1.0000 | MEDICATED_PATCH | TRANSDERMAL | 0 refills | Status: DC

## 2023-08-06 NOTE — Progress Notes (Signed)
 Subjective:    Patient ID: Sherry Price, female    DOB: 10-02-1970, 53 y.o.   MRN: [id number]  HPI  Sherry Price is a 53 y.o. 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   They are presenting to PM&R clinic as a new patient for pain management evaluation. They were referred by Gardiner Rhyme for bilateral knee pain.  Her pain is 7-8 out of 10 worsens with walking.  Patient reports she has had bilateral knee pain for many years.  Pain is worsened with activity.  Patient reports occasional right back pain mostly when ambulating longer distances.  Furthermore she also has pain due to muscle spasms in her left arm.  She thinks this is related to prior treatment of cancer of her left breast.  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  She typically walks without a cane for short distances but uses a walker for longer distances.   She has been seen by [facility] sports medicine.  She has received cortisone shots in her knees with benefit.  Cortisone does help her knee pain bilaterally however she feels like she is still very limited by her pain.  She reports her last knee injections were in March.  She says she had gel injections years ago " rooster comb" and is not sure how these helped.  She did not continue these injections regularly at the time.   She reports previously using oxycodone/Xtampza from [facility].  Patient says she discontinued seeing [facility] because they used a different insurance for 1 visit resulting in a $300 bill.  As she was unable to pay this bill she was unable to continue following with [facility] medical.  She feels like  this provided her improved pain control and she was able to walk a lot further.      Red flag symptoms: No red flags for back pain endorsed in Hx or ROS   Medications tried:   Topical medications  Nsaids- ibuprofen- helps slightly, she is currently using Tylenol  -did not help much in the past Opiates  Oyxoconde and xtamaza -were helping control her pain until he would discontinued several months ago Tramadol- didn't help Hydrocodone- She doesn't recall Gabapentin / Lyrica - Denies use TCAs  - Denies use SNRIs -currently on venlafaxine     Other treatments: Knee braces help PT- few months ago, helped her pain  TENs unit- Denies  Injections- Gel injections? Vs cortisone didn't help- tried years ago , cortisone helps - last in march  Surgery- limted by wt, taking ozempic      Interval History 11/27/22 [name] is here for bilateral cortisone knee injections.  She reports she has not had cortisone injection to her knees 3 months.  Reports cortisone injections were previously helpful to her pain.  She continues to work on losing weight.  Knee pain continues to be severe and limiting.  Oxycodone ordered this week however she has not picked this up yet.  We discussed EtOH noted on prior UDS, she was not on oxycodone at the time.  She agrees to discontinue use of alcohol since restarting this medication.   Interval History 01/29/23 [name] is here for  follow-up regarding her chronic knee pain.  Reports that knee brace and TENS unit are providing some benefit.  She went on a cruise earlier this month had a lot of increased pain due to activities.  She feels that she cannot do as much as she would like to do due to her knee pain.  Oxycodone is helping but not lasting long enough.  Knee cortisone injection helped but did not last long enough.  She feels like these lasted just a few weeks.  Interval History 02/27/23 Pt is here for b/l zilretta injections today.  It has been 3 months since  her last cortisone knee injections.  She reports her knees continue to cause significant pain and limiting her function.  Percocet 5 mg 3 times daily does help reduce her pain and keep her more comfortable.  She is not having any side effects of this medication.  Interval History 04/29/23 Patient is here for follow-up regarding her chronic knee pain.  She reports its better since Zilretta injections.  She feels like these are helping a lot more than short acting cortisone did.  Injection still helping but it is gradually decreasing.  Percocet is also helping keep her pain at a more tolerable level, no side effects with the medication.  She continues to work on weight loss.   Interval history 05/28/2023 Patient reports the prior bilateral Zilretta injections were helpful but have worn off.  She would like a repeat injection for both of her knees.  Percocet has been helping to keep her pain at a more tolerable level.  No side effects with the medication.  Interval history 06/11/23 Patient reports worsening pain in her bilateral knees.  She is here for bilateral knee Zilretta repeat injection today.  She feels like the cold weather has been worsening her knee pain as well.  She continues to use Percocet 5 mg that helps reduce her pain and allows her to be more functional.  She reports having hinged knee braces at home, she plans to also try a knee sleeve.  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  Interval history 08/06/2023 Patient is here for follow-up regarding her chronic bilateral knee pain.  Patient reports good results with Zilretta injections, pain relief lasted for about 2 months but the pain started to come back.  She continues to use Percocet and this helps her pain but does not always last long enough.  She continues to work in a [employer] where she is still walk a lot putting pressure on her knees.  Pain Inventory Average Pain 8 Pain Right Now 8 My pain is  stabbing and aching  In the last 24 hours, has pain interfered with the following? General activity 9 Relation with others 6 Enjoyment of life 7 What TIME of day is your pain at its worst? morning , daytime, evening, and night Sleep (in general) Good  Pain is worse with: walking, bending, sitting, and standing Pain improves with: rest, medication, and injections Relief from Meds: 5  Family History  Problem Relation Age of Onset   Depression Mother    Hypertension Mother    Heart disease Mother    Diabetes Mother    Alcohol abuse Father    Breast cancer Cousin    Social History   Socioeconomic History   Marital status: Single    Spouse name: Not on file   Number of children: Not on file   Years of education: Not on file  Highest education level: Not on file  Occupational History   Not on file  Tobacco Use   Smoking status: Former    Types: Cigarettes   Smokeless tobacco: Never  Vaping Use   Vaping status: Never Used  Substance and Sexual Activity   Alcohol use: Yes    Comment: occasionally   Drug use: No   Sexual activity: Not on file  Other Topics Concern   Not on file  Social History Narrative   Never married   Social Drivers of Corporate investment banker Strain: Low Risk  (03/11/2023)   Overall Financial Resource Strain (CARDIA)    Difficulty of Paying Living Expenses: Not hard at all  Food Insecurity: No Food Insecurity (03/11/2023)   Hunger Vital Sign    Worried About Running Out of Food in the Last Year: Never true    Ran Out of Food in the Last Year: Never true  Transportation Needs: No Transportation Needs (03/11/2023)   PRAPARE - Administrator, Civil Service (Medical): No    Lack of Transportation (Non-Medical): No  Physical Activity: Inactive (03/11/2023)   Exercise Vital Sign    Days of Exercise per Week: 0 days    Minutes of Exercise per Session: 0 min  Stress: Stress Concern Present (03/11/2023)   [employer] of Occupational  Health - Occupational Stress Questionnaire    Feeling of Stress : Rather much  Social Connections: Moderately Isolated (03/11/2023)   Social Connection and Isolation Panel [NHANES]    Frequency of Communication with Friends and Family: More than three times a week    Frequency of Social Gatherings with Friends and Family: Once a week    Attends Religious Services: More than 4 times per year    Active Member of [employer] or Organizations: No    Attends Banker Meetings: Never    Marital Status: Never married   Past Surgical History:  Procedure Laterality Date   BREAST BIOPSY Left 2013   +   BREAST BIOPSY Right    neg   BREAST LUMPECTOMY  2013   BREAST MAMMOSITE Left February 2013   BREAST REDUCTION SURGERY  2005   COLONOSCOPY WITH PROPOFOL N/A 01/09/2015   Procedure: COLONOSCOPY WITH PROPOFOL;  Surgeon: Midge Minium, MD;  Location: [facility] ENDOSCOPY;  Service: Endoscopy;  Laterality: N/A;   ESOPHAGOGASTRODUODENOSCOPY (EGD) WITH PROPOFOL N/A 01/09/2015   Procedure: ESOPHAGOGASTRODUODENOSCOPY (EGD) WITH PROPOFOL;  Surgeon: Midge Minium, MD;  Location: [facility] ENDOSCOPY;  Service: Endoscopy;  Laterality: N/A;   OOPHORECTOMY Right 2013   REDUCTION MAMMAPLASTY     Past Surgical History:  Procedure Laterality Date   BREAST BIOPSY Left 2013   +   BREAST BIOPSY Right    neg   BREAST LUMPECTOMY  2013   BREAST MAMMOSITE Left February 2013   BREAST REDUCTION SURGERY  2005   COLONOSCOPY WITH PROPOFOL N/A 01/09/2015   Procedure: COLONOSCOPY WITH PROPOFOL;  Surgeon: Midge Minium, MD;  Location: [facility] ENDOSCOPY;  Service: Endoscopy;  Laterality: N/A;   ESOPHAGOGASTRODUODENOSCOPY (EGD) WITH PROPOFOL N/A 01/09/2015   Procedure: ESOPHAGOGASTRODUODENOSCOPY (EGD) WITH PROPOFOL;  Surgeon: Midge Minium, MD;  Location: [facility] ENDOSCOPY;  Service: Endoscopy;  Laterality: N/A;   OOPHORECTOMY Right 2013   REDUCTION MAMMAPLASTY     Past Medical History:  Diagnosis Date   Arthritis    Breast cancer (HCC)     left breast - chemo and radiation   Diabetes mellitus without complication (HCC)    Hypertension    Malignant  neoplasm of 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 (HCC) 05/21/2011   Left breast, 2 foci: 1.2 and 1.5 cm, histologic grade 3, ER 1-5%; PR 1-5%, HER-2/neu not overexpressed.   Panic attacks    Personal history of chemotherapy    Personal history of radiation therapy    Sleep apnea    BP 112/80   Pulse 87   Ht 5\' 5"  (1.651 m)   Wt 289 lb (131.1 kg)   LMP  (LMP Unknown)   SpO2 97%   BMI 48.09 kg/m   Opioid Risk Score:   Fall Risk Score:  `1  Depression screen [facility] 2/9     06/19/2023    9:51 AM 06/11/2023   10:54 AM 05/21/2023   11:06 AM 03/27/2023    8:30 AM 03/11/2023    2:33 PM 02/27/2023   11:02 AM 01/29/2023    1:03 PM  Depression screen PHQ 2/9  Decreased Interest 1 1 1 1 1  0 0  Down, Depressed, Hopeless 2 1 2 1 1  0 0  PHQ - 2 Score 3 2 3 2 2  0 0  Altered sleeping 3  2 2 1     Tired, decreased energy 2  2 2 3     Change in appetite 1  1 1  0    Feeling bad or failure about yourself  1  1 1 1     Trouble concentrating 3  1 2 1     Moving slowly or fidgety/restless 1  0 1 0    Suicidal thoughts 0  0 0 0    PHQ-9 Score 14  10 11 8     Difficult doing work/chores Somewhat difficult  Somewhat difficult Very difficult Somewhat difficult        Review of Systems  Musculoskeletal:  Positive for back pain and gait problem.       Bilateral knee pain  All other systems reviewed and are negative.      Objective:   Physical Exam     Gen: no distress, normal appearing, obese HEENT: oral mucosa pink and moist, NCAT Chest: normal effort, normal rate of breathing Abd: soft, non-distended Psych: pleasant, normal affect Skin: intact Neuro: Alert and awake, follows commands, cranial nerves II through XII grossly intact, normal speech and language No focal motor or sensory deficits noted Musculoskeletal:  TTP at joint line bilateral knees, more anterior and  medial Bilateral knee joint crepitus  Mild knee swelling more on the right than the left Mild pain with PROM of both knees Antalgic gait       R knee xray 11/13/22 IMPRESSION: 1. Severe 3 compartmental osteoarthritis, with trace reactive suprapatellar joint effusion. 2. No acute fracture.    Assessment & Plan:  Bilateral knee OA -Opiate risk tool moderate -Previously on Xtampza and oxycodone as needed at different pain clinic -Advised to request records from [facility]- pending -ETOH on prior UDS- discussed DC and she agrees. This UDS was prior to her starting on oxycodone -Monitor routine pill counts, PDMP, UDS -Could consider gel injection for referral for genicular nerve block if poor results -She reports benefit with knee brace and TENS unit -Continue Percocet  5 mg 3 times daily as needed for breakthrough pain -Warning letter sent previously for not bringing in pain medication.  Patient out of medication today which is consistent with her last refill -Discussed that any further forgetting of pills or inconsistent pill counts, will be non opioid status -Patient reports pain constantly throughout the day, will try [facility]  5 mcg/h patch -Patient would like to avoid knee replacement -Benefit with knee injection last visit with Zilretta to her bilateral knees-will schedule follow-up in a little over a month, at least 3 months from her last injection   Muscles spasms L arm  -Improved, continue Magnesium supplementation   Morbid obesity -She was on Ozempic-- now says she is on Mongero -She says she is still working on losing weight -Provided list of medications for chronic pain and weight loss prior visit

## 2023-08-07 ENCOUNTER — Encounter: Admitting: Physical Medicine & Rehabilitation

## 2023-08-10 ENCOUNTER — Ambulatory Visit: Payer: 59 | Admitting: Physician Assistant

## 2023-08-13 ENCOUNTER — Ambulatory Visit: Admitting: Physician Assistant

## 2023-08-26 ENCOUNTER — Other Ambulatory Visit: Payer: Self-pay | Admitting: Nurse Practitioner

## 2023-08-26 DIAGNOSIS — M25561 Pain in right knee: Secondary | ICD-10-CM

## 2023-08-31 ENCOUNTER — Other Ambulatory Visit: Payer: Self-pay

## 2023-08-31 ENCOUNTER — Emergency Department
Admission: EM | Admit: 2023-08-31 | Discharge: 2023-08-31 | Disposition: A | Discharge status: Home / Self Care | Attending: Emergency Medicine | Admitting: Emergency Medicine

## 2023-08-31 DIAGNOSIS — G8929 Other chronic pain: Secondary | ICD-10-CM | POA: Diagnosis not present

## 2023-08-31 DIAGNOSIS — M25562 Pain in left knee: Secondary | ICD-10-CM | POA: Insufficient documentation

## 2023-08-31 DIAGNOSIS — Z853 Personal history of malignant neoplasm of breast: Secondary | ICD-10-CM | POA: Diagnosis not present

## 2023-08-31 DIAGNOSIS — M25561 Pain in right knee: Secondary | ICD-10-CM | POA: Diagnosis present

## 2023-08-31 MED ORDER — CYCLOBENZAPRINE HCL 10 MG PO TABS: 10.0000 mg | ORAL_TABLET | Freq: Three times a day (TID) | ORAL | 0 refills | Status: DC | PRN

## 2023-08-31 MED ORDER — MELOXICAM 15 MG PO TABS
15.0000 mg | ORAL_TABLET | Freq: Every day | ORAL | 0 refills | Status: DC
Start: 2023-08-31 — End: 2023-08-31

## 2023-08-31 MED ORDER — MELOXICAM 15 MG PO TABS: 15.0000 mg | ORAL_TABLET | Freq: Every day | ORAL | 0 refills | Status: AC

## 2023-08-31 MED ORDER — KETOROLAC TROMETHAMINE 30 MG/ML IJ SOLN
60.0000 mg | Freq: Once | INTRAMUSCULAR | Status: AC
  Administered 2023-08-31: 60 mg via INTRAMUSCULAR
  Filled 2023-08-31: qty 2

## 2023-08-31 MED ORDER — PREDNISONE 50 MG PO TABS: ORAL_TABLET | ORAL | 0 refills | Status: DC

## 2023-08-31 NOTE — Discharge Instructions (Addendum)
 You have been diagnosed with chronic pain in both legs.  Please take Flexeril 1 tablet by mouth every 8 hours after main meals, take meloxicam 1 tablet by mouth every day with breakfast  as needed for pain, take prednisone 1 tablet with breakfast every day

## 2023-08-31 NOTE — ED Triage Notes (Signed)
 Pt to ED via POV from home. Pt ambulatory to triage. Pt reports bilateral knee pain. Pt reports pain is worse in left knee. Pt has been getting injections and has had no relief.

## 2023-08-31 NOTE — ED Provider Notes (Signed)
 [facility] Provider Note    Event Date/Time   First MD Initiated Contact with Patient 08/31/23 1815     (approximate)   History   Knee Pain    HPI  Sherry Price is a 53 y.o. female      with a past medical history of arthrosis, chronic bilateral knee pain, breast cancer, who presents to the ED complaining of bilateral knee pain.   According to the patient, she is getting injections of Zilretta  in her knees every 3 months.  Next injections will be in May 8, patient request pain medicine until  her next appointment.  Patient states having worse knee pain on her left knee.Patient  is able to walk.  Patient works at daycare.      Physical Exam   Triage Vital Signs: ED Triage Vitals  Encounter Vitals Group     BP 08/31/23 1746 (!) 127/103     Systolic BP Percentile --      Diastolic BP Percentile --      Pulse Rate 08/31/23 1745 86     Resp 08/31/23 1745 20     Temp 08/31/23 1745 98.3 F (36.8 C)     Temp src --      SpO2 08/31/23 1745 100 %     Weight 08/31/23 1815 289 lb 0.4 oz (131.1 kg)     Height 08/31/23 1815 5\' 5"  (1.651 m)     Head Circumference --      Peak Flow --      Pain Score 08/31/23 1745 9     Pain Loc --      Pain Education --      Exclude from Growth Chart --     Most recent vital signs: Vitals:   08/31/23 1745 08/31/23 1746  BP:  (!) 127/103  Pulse: 86   Resp: 20   Temp: 98.3 F (36.8 C)   SpO2: 100%      Constitutional: Alert, NAD. Able to speak in complete sentences without cough or dyspnea  Eyes: Conjunctivae are normal.  Head: Atraumatic. Nose: No congestion/rhinnorhea. Mouth/Throat: Mucous membranes are moist.   Neck: Painless ROM. Supple. No JVD, nodes, thyromegaly  Cardiovascular:   Good peripheral circulation.RRR no murmurs, gallops, rubs  Respiratory: Normal respiratory effort.  No retractions. Clear to auscultation bilaterally without wheezing or crackles  Gastrointestinal: Soft and nontender.   Musculoskeletal:  Right knee: Skin is intact, prepatellar edema. Left knee: Skin is intact, prepatellar edema, tender to palpation in lateral medial side.  Full flexion is limited by pain.  Neurologic:  MAE spontaneously. No gross focal neurologic deficits are appreciated.  Skin:  Skin is warm, dry and intact. No rash noted. Psychiatric: Mood and affect are normal. Speech and behavior are normal.    ED Results / Procedures / Treatments   Labs (all labs ordered are listed, but only abnormal results are displayed) Labs Reviewed - No data to display   EKG    RADIOLOGY    PROCEDURES:  Critical Care performed:   Procedures   MEDICATIONS ORDERED IN ED: Medications  ketorolac (TORADOL) 30 MG/ML injection 60 mg (has no administration in time range)      IMPRESSION / MDM / ASSESSMENT AND PLAN / ED COURSE  I reviewed the triage vital signs and the nursing notes.  Differential diagnosis includes, but is not limited to, arthritis, chronic knee pain, fracture  Patient's presentation is most consistent with acute, uncomplicated illness.  Patient's diagnosis is  consistent with chronic bilateral knee pain.  I did review the patient's allergies and medications.The patient is in stable and satisfactory condition for discharge home during admission patient received ketorolac intramuscular for pain.  Patient will be discharged home with prescriptions for meloxicam, Flexeril, prednisone.  Patient is to follow up with physical medicine and rehab as needed or otherwise directed. Patient is given ED precautions to return to the ED for any worsening or new symptoms. Discussed plan of care with patient, answered all of patient's questions, Patient agreeable to plan of care. Advised patient to take medications according to the instructions on the label. Discussed possible side effects of new medications. Patient verbalized understanding.    FINAL CLINICAL IMPRESSION(S) / ED DIAGNOSES   Final  diagnoses:  Chronic pain of both knees     Rx / DC Orders   ED Discharge Orders          Ordered    meloxicam (MOBIC) 15 MG tablet  Daily,   Status:  Discontinued        08/31/23 1842    cyclobenzaprine (FLEXERIL) 10 MG tablet  3 times daily PRN,   Status:  Discontinued        08/31/23 1842    predniSONE (DELTASONE) 50 MG tablet        08/31/23 1842    cyclobenzaprine (FLEXERIL) 10 MG tablet  3 times daily PRN        08/31/23 1844    meloxicam (MOBIC) 15 MG tablet  Daily        08/31/23 1845             Note:  This document was prepared using Dragon voice recognition software and may include unintentional dictation errors.   Awilda Lennox, PA-C 08/31/23 1845    Arline Bennett, MD 08/31/23 Jacobo Masters

## 2023-09-10 ENCOUNTER — Encounter: Attending: Physical Medicine & Rehabilitation | Admitting: Physical Medicine & Rehabilitation

## 2023-09-10 ENCOUNTER — Encounter: Payer: Self-pay | Admitting: Physical Medicine & Rehabilitation

## 2023-09-10 VITALS — BP 119/81 | HR 81 | Ht 65.0 in | Wt 287.0 lb

## 2023-09-10 DIAGNOSIS — Z79899 Other long term (current) drug therapy: Secondary | ICD-10-CM

## 2023-09-10 DIAGNOSIS — Z5181 Encounter for therapeutic drug level monitoring: Secondary | ICD-10-CM

## 2023-09-10 DIAGNOSIS — M170 Bilateral primary osteoarthritis of knee: Secondary | ICD-10-CM

## 2023-09-10 DIAGNOSIS — G894 Chronic pain syndrome: Secondary | ICD-10-CM

## 2023-09-10 MED ORDER — OXYCODONE-ACETAMINOPHEN 5-325 MG PO TABS: 1.0000 | ORAL_TABLET | Freq: Three times a day (TID) | ORAL | 0 refills | Status: DC | PRN

## 2023-09-10 MED ORDER — BUPRENORPHINE 5 MCG/HR TD PTWK: 1.0000 | MEDICATED_PATCH | TRANSDERMAL | 0 refills | Status: DC

## 2023-09-10 MED ORDER — TRIAMCINOLONE ACETONIDE 32 MG IX SRER
64.0000 mg | Freq: Once | INTRA_ARTICULAR | Status: AC
Start: 2023-09-10 — End: 2023-09-10
  Administered 2023-09-10: 64 mg via INTRA_ARTICULAR

## 2023-09-10 NOTE — Progress Notes (Signed)
 Subjective:    Patient ID: Sherry Price, female    DOB: 11-07-1970, 53 y.o.   MRN: [id number]  HPI  Sherry Price is a 53 y.o. 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   They are presenting to PM&R clinic as a new patient for pain management evaluation. They were referred by Sherry Price for bilateral knee pain.  Her pain is 7-8 out of 10 worsens with walking.  Patient reports she has had bilateral knee pain for many years.  Pain is worsened with activity.  Patient reports occasional right back pain mostly when ambulating longer distances.  Furthermore she also has pain due to muscle spasms in her left arm.  She thinks this is related to prior treatment of cancer of her left breast.  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  She typically walks without a cane for short distances but uses a walker for longer distances.   She has been seen by [facility] sports medicine.  She has received cortisone shots in her knees with benefit.  Cortisone does help her knee pain bilaterally however she feels like she is still very limited by her pain.  She reports her last knee injections were in March.  She says she had gel injections years ago " rooster comb" and is not sure how these helped.  She did not continue these injections regularly at the time.   She reports previously using oxycodone /Xtampza  from [facility].  Patient says she discontinued seeing [facility] because they used a different insurance for 1 visit resulting in a $300 bill.  As she was unable to pay this bill she was unable to continue following with [facility] medical.  She feels like  this provided her improved pain control and she was able to walk a lot further.      Red flag symptoms: No red flags for back pain endorsed in Hx or ROS   Medications tried:   Topical medications  Nsaids- ibuprofen - helps slightly, she is currently using Tylenol   -did not help much in the past Opiates  Oyxoconde and xtamaza -were helping control her pain until he would discontinued several months ago Tramadol- didn't help Hydrocodone- She doesn't recall Gabapentin / Lyrica - Denies use TCAs  - Denies use SNRIs -currently on venlafaxine      Other treatments: Knee braces help PT- few months ago, helped her pain  TENs unit- Denies  Injections- Gel injections? Vs cortisone didn't help- tried years ago , cortisone helps - last in march  Surgery- limted by wt, taking ozempic       Interval History 11/27/22 Sherry Price is here for bilateral cortisone knee injections.  She reports she has not had cortisone injection to her knees 3 months.  Reports cortisone injections were previously helpful to her pain.  She continues to work on losing weight.  Knee pain continues to be severe and limiting.  Oxycodone  ordered this week however she has not picked this up yet.  We discussed EtOH noted on prior UDS, she was not on oxycodone  at the time.  She agrees to discontinue use of alcohol since restarting this medication.   Interval History 01/29/23 Sherry Price is here for  follow-up regarding her chronic knee pain.  Reports that knee brace and TENS unit are providing some benefit.  She went on a cruise earlier this month had a lot of increased pain due to activities.  She feels that she cannot do as much as she would like to do due to her knee pain.  Oxycodone  is helping but not lasting long enough.  Knee cortisone injection helped but did not last long enough.  She feels like these lasted just a few weeks.  Interval History 02/27/23 Pt is here for b/l zilretta  injections today.  It has been 3 months since  her last cortisone knee injections.  She reports her knees continue to cause significant pain and limiting her function.  Percocet 5 mg 3 times daily does help reduce her pain and keep her more comfortable.  She is not having any side effects of this medication.  Interval History 04/29/23 Patient is here for follow-up regarding her chronic knee pain.  She reports its better since Zilretta  injections.  She feels like these are helping a lot more than short acting cortisone did.  Injection still helping but it is gradually decreasing.  Percocet is also helping keep her pain at a more tolerable level, no side effects with the medication.  She continues to work on weight loss.   Interval history 05/28/2023 Patient reports the prior bilateral Zilretta  injections were helpful but have worn off.  She would like a repeat injection for both of her knees.  Percocet has been helping to keep her pain at a more tolerable level.  No side effects with the medication.  Interval history 06/11/23 Patient reports worsening pain in her bilateral knees.  She is here for bilateral knee Zilretta  repeat injection today.  She feels like the cold weather has been worsening her knee pain as well.  She continues to use Percocet 5 mg that helps reduce her pain and allows her to be more functional.  She reports having hinged knee braces at home, she plans to also try a knee sleeve.  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  Interval history 08/06/2023 Patient is here for follow-up regarding her chronic bilateral knee pain.  Patient reports good results with Zilretta  injections, pain relief lasted for about 2 months but the pain started to come back.  She continues to use Percocet and this helps her pain but does not always last long enough.  She continues to work in a [employer] where she is still walk a lot putting pressure on her knees.   Interval history 09/10/23 Patient is here for bilateral knee  injections with Zilretta .  She reports good benefit with prior injections.  She was in the ER last week for her knee pain was treated with ketorolac , meloxicam , Flexeril , few days of prednisone .  She thinks she may have overly stressed her knees somehow before this.  She has been out of oxycodone  and has been off buprenorphine  for a few days, last filled over a month ago.  She reports buprenorphine  was helping her overall pain and oxycodone  was helping breakthrough pain.  Patient reports she has been having difficulty because her employer does not want her to use a cane at work.  Pain Inventory Average Pain 8 Pain Right Now 8 My pain is stabbing and aching  In the last 24 hours, has pain interfered with the following? General activity 9 Relation with others 6 Enjoyment of life 7 What TIME of day is your pain  at its worst? morning , daytime, evening, and night Sleep (in general) Good  Pain is worse with: walking, bending, sitting, and standing Pain improves with: rest, medication, and injections Relief from Meds: 5  Family History  Problem Relation Age of Onset   Depression Mother    Hypertension Mother    Heart disease Mother    Diabetes Mother    Alcohol abuse Father    Breast cancer Cousin    Social History   Socioeconomic History   Marital status: Single    Spouse name: Not on file   Number of children: Not on file   Years of education: Not on file   Highest education level: Not on file  Occupational History   Not on file  Tobacco Use   Smoking status: Former    Types: Cigarettes   Smokeless tobacco: Never  Vaping Use   Vaping status: Never Used  Substance and Sexual Activity   Alcohol use: Yes    Comment: occasionally   Drug use: No   Sexual activity: Not on file  Other Topics Concern   Not on file  Social History Narrative   Never married   Social Drivers of Corporate investment banker Strain: Low Risk  (03/11/2023)   Overall Financial Resource Strain  (CARDIA)    Difficulty of Paying Living Expenses: Not hard at all  Food Insecurity: No Food Insecurity (03/11/2023)   Hunger Vital Sign    Worried About Running Out of Food in the Last Year: Never true    Ran Out of Food in the Last Year: Never true  Transportation Needs: No Transportation Needs (03/11/2023)   PRAPARE - Administrator, Civil Service (Medical): No    Lack of Transportation (Non-Medical): No  Physical Activity: Inactive (03/11/2023)   Exercise Vital Sign    Days of Exercise per Week: 0 days    Minutes of Exercise per Session: 0 min  Stress: Stress Concern Present (03/11/2023)   [employer] of Occupational Health - Occupational Stress Questionnaire    Feeling of Stress : Rather much  Social Connections: Moderately Isolated (03/11/2023)   Social Connection and Isolation Panel [NHANES]    Frequency of Communication with Friends and Family: More than three times a week    Frequency of Social Gatherings with Friends and Family: Once a week    Attends Religious Services: More than 4 times per year    Active Member of [employer] or Organizations: No    Attends Banker Meetings: Never    Marital Status: Never married   Past Surgical History:  Procedure Laterality Date   BREAST BIOPSY Left 2013   +   BREAST BIOPSY Right    neg   BREAST LUMPECTOMY  2013   BREAST MAMMOSITE Left February 2013   BREAST REDUCTION SURGERY  2005   COLONOSCOPY WITH PROPOFOL  N/A 01/09/2015   Procedure: COLONOSCOPY WITH PROPOFOL ;  Surgeon: Marnee Sink, MD;  Location: [facility] ENDOSCOPY;  Service: Endoscopy;  Laterality: N/A;   ESOPHAGOGASTRODUODENOSCOPY (EGD) WITH PROPOFOL  N/A 01/09/2015   Procedure: ESOPHAGOGASTRODUODENOSCOPY (EGD) WITH PROPOFOL ;  Surgeon: Marnee Sink, MD;  Location: [facility] ENDOSCOPY;  Service: Endoscopy;  Laterality: N/A;   OOPHORECTOMY Right 2013   REDUCTION MAMMAPLASTY     Past Surgical History:  Procedure Laterality Date   BREAST BIOPSY Left 2013   +   BREAST  BIOPSY Right    neg   BREAST LUMPECTOMY  2013   BREAST MAMMOSITE Left February 2013  BREAST REDUCTION SURGERY  2005   COLONOSCOPY WITH PROPOFOL  N/A 01/09/2015   Procedure: COLONOSCOPY WITH PROPOFOL ;  Surgeon: Marnee Sink, MD;  Location: [facility] ENDOSCOPY;  Service: Endoscopy;  Laterality: N/A;   ESOPHAGOGASTRODUODENOSCOPY (EGD) WITH PROPOFOL  N/A 01/09/2015   Procedure: ESOPHAGOGASTRODUODENOSCOPY (EGD) WITH PROPOFOL ;  Surgeon: Marnee Sink, MD;  Location: [facility] ENDOSCOPY;  Service: Endoscopy;  Laterality: N/A;   OOPHORECTOMY Right 2013   REDUCTION MAMMAPLASTY     Past Medical History:  Diagnosis Date   Arthritis    Breast cancer (HCC)    left breast - chemo and radiation   Diabetes mellitus without complication (HCC)    Hypertension    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   Panic attacks    Personal history of chemotherapy    Personal history of radiation therapy    Sleep apnea    BP 119/81   Pulse 81   Ht 5\' 5"  (1.651 m)   Wt 287 lb (130.2 kg)   LMP  (LMP Unknown)   SpO2 98%   BMI 47.76 kg/m   Opioid Risk Score:   Fall Risk Score:  `1  Depression screen [facility] 2/9     09/10/2023   11:10 AM 06/19/2023    9:51 AM 06/11/2023   10:54 AM 05/21/2023   11:06 AM 03/27/2023    8:30 AM 03/11/2023    2:33 PM 02/27/2023   11:02 AM  Depression screen PHQ 2/9  Decreased Interest 0 1 1 1 1 1  0  Down, Depressed, Hopeless 0 2 1 2 1 1  0  PHQ - 2 Score 0 3 2 3 2 2  0  Altered sleeping  3  2 2 1    Tired, decreased energy  2  2 2 3    Change in appetite  1  1 1  0   Feeling bad or failure about yourself   1  1 1 1    Trouble concentrating  3  1 2 1    Moving slowly or fidgety/restless  1  0 1 0   Suicidal thoughts  0  0 0 0   PHQ-9 Score  14  10 11 8    Difficult doing work/chores  Somewhat difficult  Somewhat difficult Very difficult Somewhat difficult        Review of Systems  Musculoskeletal:  Positive for back pain and gait problem.       Bilateral knee pain  All other systems reviewed and are negative.      Objective:   Physical Exam     Gen: no distress, normal appearing, obese HEENT: oral mucosa pink and moist, NCAT Chest: normal effort, normal rate of breathing Abd: soft, non-distended Psych: pleasant, normal affect Skin: intact Neuro: Alert and awake, follows commands, cranial nerves II through XII grossly intact, normal speech and language No focal motor or sensory deficits noted Musculoskeletal:  TTP at joint line bilateral knees, more anterior and medial Bilateral knee joint crepitus  Mild knee swelling a little more on the left side today Mild pain with PROM of both knees Antalgic gait, walking with a cane       R knee xray 11/13/22 IMPRESSION: 1. Severe 3 compartmental osteoarthritis, with trace reactive suprapatellar joint effusion. 2. No acute fracture.    Assessment & Plan:  Bilateral knee OA -Opiate risk tool moderate -Previously on Xtampza  and oxycodone  as needed at different  pain clinic -Advised to request records from [facility]- pending -ETOH on prior UDS- discussed DC and she agrees. This UDS was prior to her starting on oxycodone  -Monitor routine pill counts, PDMP, UDS -Could consider gel injection for referral for genicular nerve block if poor results -She reports benefit with knee brace and TENS unit -Continue Percocet  5 mg 3 times daily as needed for breakthrough pain -Warning letter sent previously for not bringing in pain medication.  Patient out of medication today which is consistent with her last refill -Discussed that any further forgetting of pills or inconsistent pill counts, will be non opioid status -Continue Butrans  5 mcg/h patch -Patient would like to avoid knee replacement    Knee injection Indication:b/l Knee pain not relieved by medication management and  other conservative care.   Informed consent was obtained after describing risks and benefits of the procedure with the patient, this includes bleeding, bruising, infection and medication side effects. The patient wishes to proceed and has given written consent. The patient was placed in a recumbent position. The lateral aspect of the knee was marked and prepped with Betadine and alcohol. It was then entered with a 21-gauge 1-1/2 inch needle after negative draw back for blood, a solution containing Ziltretta was injected. The patient tolerated the procedure well. This was done on both knees. Post procedure instructions were given.   Muscles spasms L arm  -Improved, continue Magnesium supplementation   Morbid obesity -She was on Ozempic -- later on Mongero -She says she is still working on losing weight -Provided list of medications for chronic pain and weight loss prior visit

## 2023-09-13 LAB — DRUG TOX MONITOR 1 W/CONF, ORAL FLD
Amphetamines: NEGATIVE ng/mL
Barbiturates: NEGATIVE ng/mL
Benzodiazepines: NEGATIVE ng/mL
Buprenorphine: NEGATIVE ng/mL
Cocaine: NEGATIVE ng/mL
Fentanyl: NEGATIVE ng/mL
Heroin Metabolite: NEGATIVE ng/mL
MARIJUANA: NEGATIVE ng/mL
MDMA: NEGATIVE ng/mL
Meprobamate: NEGATIVE ng/mL
Methadone: NEGATIVE ng/mL
Nicotine Metabolite: NEGATIVE ng/mL
Opiates: NEGATIVE ng/mL
Phencyclidine: NEGATIVE ng/mL
Tapentadol: NEGATIVE ng/mL
Tramadol: NEGATIVE ng/mL
Zolpidem: NEGATIVE ng/mL

## 2023-09-13 LAB — DRUG TOX ALC METAB W/CON, ORAL FLD: Alcohol Metabolite: NEGATIVE ng/mL (ref <25)

## 2023-09-22 ENCOUNTER — Ambulatory Visit (INDEPENDENT_AMBULATORY_CARE_PROVIDER_SITE_OTHER): Admitting: Family

## 2023-09-22 ENCOUNTER — Encounter: Payer: Self-pay | Admitting: Family

## 2023-09-22 ENCOUNTER — Ambulatory Visit: Admitting: Nurse Practitioner

## 2023-09-22 VITALS — BP 140/80 | HR 101 | Temp 98.0°F | Ht 65.0 in | Wt 289.0 lb

## 2023-09-22 DIAGNOSIS — R350 Frequency of micturition: Secondary | ICD-10-CM

## 2023-09-22 DIAGNOSIS — R399 Unspecified symptoms and signs involving the genitourinary system: Secondary | ICD-10-CM

## 2023-09-22 DIAGNOSIS — G8929 Other chronic pain: Secondary | ICD-10-CM

## 2023-09-22 DIAGNOSIS — E119 Type 2 diabetes mellitus without complications: Secondary | ICD-10-CM | POA: Diagnosis not present

## 2023-09-22 DIAGNOSIS — I10 Essential (primary) hypertension: Secondary | ICD-10-CM | POA: Diagnosis not present

## 2023-09-22 DIAGNOSIS — M25562 Pain in left knee: Secondary | ICD-10-CM

## 2023-09-22 DIAGNOSIS — M25561 Pain in right knee: Secondary | ICD-10-CM | POA: Diagnosis not present

## 2023-09-22 DIAGNOSIS — Z7985 Long-term (current) use of injectable non-insulin antidiabetic drugs: Secondary | ICD-10-CM | POA: Diagnosis not present

## 2023-09-22 DIAGNOSIS — Z6841 Body Mass Index (BMI) 40.0 and over, adult: Secondary | ICD-10-CM

## 2023-09-22 LAB — URINALYSIS, ROUTINE W REFLEX MICROSCOPIC
Bilirubin Urine: NEGATIVE
Ketones, ur: NEGATIVE
Nitrite: POSITIVE — AB
Specific Gravity, Urine: 1.015 (ref 1.000–1.030)
Total Protein, Urine: NEGATIVE
Urine Glucose: NEGATIVE
Urobilinogen, UA: 0.2 (ref 0.0–1.0)
pH: 6 (ref 5.0–8.0)

## 2023-09-22 LAB — POCT GLYCOSYLATED HEMOGLOBIN (HGB A1C): Hemoglobin A1C: 5.9 % — AB (ref 4.0–5.6)

## 2023-09-22 LAB — POC URINALSYSI DIPSTICK (AUTOMATED)
Bilirubin, UA: NEGATIVE
Glucose, UA: NEGATIVE
Ketones, UA: NEGATIVE
Nitrite, UA: POSITIVE
Protein, UA: NEGATIVE
Spec Grav, UA: 1.01 (ref 1.010–1.025)
Urobilinogen, UA: 0.2 U/dL
pH, UA: 5 (ref 5.0–8.0)

## 2023-09-22 LAB — MICROALBUMIN / CREATININE URINE RATIO
Creatinine,U: 39.2 mg/dL
Microalb Creat Ratio: 44.6 mg/g — ABNORMAL HIGH (ref 0.0–30.0)
Microalb, Ur: 1.8 mg/dL (ref 0.0–1.9)

## 2023-09-22 MED ORDER — CYCLOBENZAPRINE HCL 10 MG PO TABS: 5.0000 mg | ORAL_TABLET | Freq: Every day | ORAL | 1 refills | Status: AC

## 2023-09-22 MED ORDER — NITROFURANTOIN MONOHYD MACRO 100 MG PO CAPS: 100.0000 mg | ORAL_CAPSULE | Freq: Two times a day (BID) | ORAL | 0 refills | Status: DC

## 2023-09-22 MED ORDER — TIRZEPATIDE 12.5 MG/0.5ML ~~LOC~~ SOAJ: 12.5000 mg | SUBCUTANEOUS | 0 refills | Status: DC

## 2023-09-22 MED ORDER — ONDANSETRON 4 MG PO TBDP: ORAL_TABLET | ORAL | 2 refills | Status: AC

## 2023-09-22 NOTE — Progress Notes (Signed)
 Assessment & Plan:   Chronic pain of both knees Assessment & Plan: Acute on chronic.  Improved with recent injections provided by pain management.  Completed paperwork for employer.  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  She is taking opioids appropriately as managed by pain management.  She has been very involved in her health working with pain management, and focused on weight loss to improve her knee pain.   Orders: -     Cyclobenzaprine  HCl; Take 0.5-1 tablets (5-10 mg total) by mouth at bedtime.  Dispense: 30 tablet; Refill: 1  Urinary frequency -     POCT Urinalysis Dipstick (Automated) -     Urinalysis, Routine w reflex microscopic -     Urine Culture -     Nitrofurantoin Monohyd Macro; Take 1 capsule (100 mg total) by mouth 2 (two) times daily. Take with food.  Dispense: 10 capsule; Refill: 0  Type 2 diabetes mellitus without complication, without long-term current use of insulin (HCC) Assessment & Plan: Lab Results  Component Value Date   HGBA1C 5.9 (A) 09/22/2023   Excellent control.  Congratulated patient on significant weight loss.  She is overall tolerating Mounjaro  well.  I provided refill Mounjaro  12.5 mg.  I have also refilled Zofran  for infrequent use.  Orders: -     Ondansetron ; DISSOLVE 1 TO 2 TABLETS ON THE TONGUE EVERY 8 HOURS AS NEEDED FOR NAUSEA OR VOMITING  Dispense: 30 tablet; Refill: 2 -     Tirzepatide ; Inject 12.5 mg into the skin once a week.  Dispense: 2 mL; Refill: 0 -     Microalbumin / creatinine urine ratio -     POCT glycosylated hemoglobin (Hb A1C)  Body mass index (BMI) of 50-59.9 in adult (HCC) -     Tirzepatide ; Inject 12.5 mg into the skin once a week.  Dispense: 2 mL; Refill: 0  UTI symptoms Assessment & Plan: No systemic features. Excellent glycemic control. UA concerning for UTI with moderate blood, positive nitrates and large   leukocytes.  Start Macrobid ahead of urine culture.    Benign essential hypertension Assessment & Plan: Blood pressure  elevated today.  I suspect pain, concern for employer contributory.  No changes to medication regimen and advised to make interim follow-up and to bring blood pressure readings from home      Return precautions given.   Risks, benefits, and alternatives of the medications and treatment plan prescribed today were discussed, and patient expressed understanding.   Education regarding symptom management and diagnosis given to patient on AVS either electronically or printed.  Return in about 3 months (around 12/23/2023).  Bascom Bossier, FNP  Subjective:    Patient ID: Sherry Price, female    DOB: 11/09/1970, 53 y.o.   MRN: [id number]  CC: Sherry Price is a 53 y.o. female who presents today for an acute visit.    HPI: Here today to complete papework for work and staff health assessment for BL knee pain, L > R.     She started 5 months ago with new job [employer].   She reports BL knee pain and employer told her that she couldn't have accommodations for knee pain; she was told she cannot have a cane , walker.  She is able to safely perform her work duties however more recently when  left knee pain increased, she used cane to stabilize herself as  she was getting out of a chair.  She has a walker at home. No falls.   Bilateral knee pain has improved after injection earlier this month by Dr Estill Hemming.   She is tearful due to treatment and lack of support by colleagues.   Following with pain management Dr Estill Hemming for BL knee pain.  She is compliant with percocet, butrans ; last seen 09/10/23 for b/l knee injection No h/o CKD  Very occassional nausea on mounjaro . She keeps zofran  for infrequent use. She would like refill today.  She has lost 75lbs over the past several months on mounjaro .   H/o DM, breast cancer  Severe arthritis right knee  xray, 11/2022  She has also noticed increased urination x 2 weeks, unchanged.   Denies f, chills, flank pain, rectal bleeding, hematuria.   She is not drinking enough water She has h/o hemorrhoids.    Allergies: Lisinopril and Losartan Current Outpatient Medications on File Prior to Visit  Medication Sig Dispense Refill   ALPRAZolam  (XANAX ) 0.5 MG tablet TAKE 1 TABLET(0.5 MG) BY MOUTH DAILY AS NEEDED FOR ANXIETY 30 tablet 5   amLODipine  (NORVASC ) 10 MG tablet TAKE 1 TABLET(10 MG) BY MOUTH DAILY 90 tablet 3   Aspirin -Acetaminophen -Caffeine  (GOODY HEADACHE PO) Take 1 packet by mouth as needed.     atorvastatin (LIPITOR) 40 MG tablet Take 40 mg by mouth daily.     buprenorphine  (BUTRANS ) 5 MCG/HR PTWK Place 1 patch onto the skin once a week. 4 patch 0   calcium  carbonate (TUMS) 500 MG chewable tablet Chew 1 tablet (200 mg of elemental calcium  total) by mouth 2 (two) times daily. 20 tablet 0   hydrochlorothiazide  (HYDRODIURIL ) 25 MG tablet TAKE 1/2 TABLET(12.5 MG) BY MOUTH DAILY 45 tablet 3   labetalol  (NORMODYNE ) 200 MG tablet TAKE 1 TABLET(200 MG) BY MOUTH TWICE DAILY 180 tablet 3   mupirocin  ointment (BACTROBAN ) 2 % Apply 1 Application topically 2 (two) times daily. 22 g 0   oxyCODONE -acetaminophen  (PERCOCET) 5-325 MG tablet Take 1 tablet by mouth every 8 (eight) hours as needed for severe pain (pain score 7-10). 90 tablet 0   venlafaxine  XR (EFFEXOR -XR) 150 MG 24 hr capsule Take 1 capsule (150 mg total) by mouth daily with breakfast. 90 capsule 3   No current facility-administered medications on file prior to visit.    Review of Systems  Constitutional:  Negative for chills and fever.  Respiratory:  Negative for cough.   Cardiovascular:  Negative for chest pain and palpitations.  Gastrointestinal:  Negative for abdominal pain, nausea and vomiting.  Genitourinary:  Positive for frequency. Negative for dysuria and flank pain.  Musculoskeletal:  Positive for arthralgias.       Objective:    BP (!) 140/80   Pulse (!) 101   Temp 98 F (36.7 C) (Oral)   Ht 5\' 5"  (1.651 m)   Wt 289 lb (131.1 kg)   LMP  (LMP Unknown)   SpO2 96%   BMI 48.09 kg/m   BP Readings from Last 3 Encounters:  09/22/23 (!) 140/80  09/10/23 119/81  08/31/23 (!) 127/103   Wt Readings from Last 3 Encounters:  09/22/23 289 lb (131.1 kg)  09/10/23 287 lb (130.2 kg)  08/31/23 289 lb 0.4 oz (131.1 kg)    Physical Exam Vitals reviewed.  Constitutional:      Appearance: She is well-developed.  Eyes:     Conjunctiva/sclera: Conjunctivae normal.  Cardiovascular:     Rate and Rhythm: Normal rate and regular rhythm.  Pulses: Normal pulses.     Heart sounds: Normal heart sounds.  Pulmonary:     Effort: Pulmonary effort is normal.     Breath sounds: Normal breath sounds. No wheezing, rhonchi or rales.  Musculoskeletal:     Right knee: No swelling or bony tenderness. Normal range of motion.     Left knee: Bony tenderness and crepitus present. Decreased range of motion.     Comments: Wearing brace on left knee Bilateral knees are symmetric. No effusion appreciated. No increase in warmth or erythema. Crepitus felt with flexion of bilateral knees.  Bilateral knees:  Able to extend to -5 to 10 degrees and flex to 110 degrees. No catching with McMurray maneuver. No patellar apprehension. Negative anterior drawer and lachman's- no laxity appreciated.  No calf tenderness of lower leg edema bilaterally.   Get up and go time is prolonged Gait in room is steady.    Skin:    General: Skin is warm and dry.  Neurological:     Mental Status: She is alert.  Psychiatric:        Speech: Speech normal.        Behavior: Behavior normal.        Thought Content: Thought content normal.

## 2023-09-22 NOTE — Patient Instructions (Addendum)
 A1c is excellent, 5.9  Urine is concerning for infection.   Please start antibiotic macrobid while we await on urine culture  Ensure to take probiotics while on antibiotics and also for 2 weeks after completion. This can either be by eating yogurt daily or taking a probiotic supplement over the counter such as Culturelle.It is important to re-colonize the gut with good bacteria and also to prevent any diarrheal infections associated with antibiotic use.   Work note provided  Please let me know how we can support you.

## 2023-09-22 NOTE — Assessment & Plan Note (Signed)
 No systemic features. Excellent glycemic control. UA concerning for UTI with moderate blood, positive nitrates and large  leukocytes.  Start Macrobid ahead of urine culture.

## 2023-09-22 NOTE — Telephone Encounter (Signed)
 Pt seeing Sherry Price and will address at appointment

## 2023-09-24 ENCOUNTER — Telehealth: Payer: Self-pay | Admitting: Family

## 2023-09-24 NOTE — Telephone Encounter (Signed)
 Call pt  During visit this week blood pressure elevated.  I wanted to bring her back sooner for blood pressure follow-up.  Please asked if she has a blood pressure machine at home where she can provide readings.  Please ensure she is taking amlodipine  10 mg, hydrochlorothiazide  25 mg, labetalol  200 mg as listed on chart Sch BP check with preferably Lorice Roof ( PCP),  or myself

## 2023-09-24 NOTE — Assessment & Plan Note (Signed)
 Acute on chronic.  Improved with recent injections provided by pain management.  Completed paperwork for employer.  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  She is taking opioids appropriately as managed by pain management.  She has been very involved in her health working with pain management, and focused on weight loss to improve her knee pain.

## 2023-09-24 NOTE — Assessment & Plan Note (Signed)
 Lab Results  Component Value Date   HGBA1C 5.9 (A) 09/22/2023   Excellent control.  Congratulated patient on significant weight loss.  She is overall tolerating Mounjaro  well.  I provided refill Mounjaro  12.5 mg.  I have also refilled Zofran  for infrequent use.

## 2023-09-24 NOTE — Telephone Encounter (Signed)
 Patient states she is taking the Amlodipine  10 mg, hydrochlorothiazide  25 mg and the Labetalol  200 mg. Patient does Not have a BP machine at home to check her blood pressure. Patient is scheduled to see Bluford Burkitt on 09/30/23 at 10:45 for blood pressure follow up.  Patient would like to know if one of you could prescribe her a BP machine, glucose reader and compression stockings.

## 2023-09-24 NOTE — Assessment & Plan Note (Signed)
 Blood pressure  elevated today.  I suspect pain, concern for employer contributory.  No changes to medication regimen and advised to make interim follow-up and to bring blood pressure readings from home

## 2023-09-25 ENCOUNTER — Ambulatory Visit: Payer: Self-pay | Admitting: Family

## 2023-09-25 ENCOUNTER — Other Ambulatory Visit: Payer: Self-pay | Admitting: Family

## 2023-09-25 ENCOUNTER — Other Ambulatory Visit: Payer: Self-pay

## 2023-09-25 DIAGNOSIS — R8290 Unspecified abnormal findings in urine: Secondary | ICD-10-CM

## 2023-09-25 DIAGNOSIS — R600 Localized edema: Secondary | ICD-10-CM

## 2023-09-25 DIAGNOSIS — I10 Essential (primary) hypertension: Secondary | ICD-10-CM

## 2023-09-25 DIAGNOSIS — R399 Unspecified symptoms and signs involving the genitourinary system: Secondary | ICD-10-CM

## 2023-09-25 DIAGNOSIS — R350 Frequency of micturition: Secondary | ICD-10-CM

## 2023-09-25 DIAGNOSIS — E119 Type 2 diabetes mellitus without complications: Secondary | ICD-10-CM

## 2023-09-25 LAB — URINE CULTURE
MICRO NUMBER:: 16479184
SPECIMEN QUALITY:: ADEQUATE

## 2023-09-25 MED ORDER — BLOOD GLUCOSE TEST VI STRP: 1.0000 | ORAL_STRIP | Freq: Three times a day (TID) | 0 refills | Status: AC

## 2023-09-25 MED ORDER — LANCETS MISC. MISC
1.0000 | Freq: Three times a day (TID) | 0 refills | Status: AC
Start: 2023-09-25 — End: 2023-10-25

## 2023-09-25 MED ORDER — LANCET DEVICE MISC
1.0000 | Freq: Three times a day (TID) | 0 refills | Status: AC
Start: 2023-09-25 — End: 2023-10-25

## 2023-09-25 MED ORDER — BLOOD GLUCOSE MONITORING SUPPL DEVI: 1.0000 | Freq: Three times a day (TID) | 0 refills | Status: AC

## 2023-09-25 NOTE — Telephone Encounter (Signed)
 Copied from CRM [id number]. Topic: Clinical - Prescription Issue >> Sep 25, 2023  4:14 PM Felizardo Hotter wrote: Reason for CRM: Pt called stated she needs prescription for compression stockings sent to  [id number] [place], [place] - [address] AT [facility] OF [address] [place] [address] Phone: [phone] Fax: [phone]

## 2023-09-25 NOTE — Telephone Encounter (Signed)
 Sent in prescription for glucometer and supplies to [facility] in [place] per patient request. Also let Patient know the DME orders for compression stockings and blood pressure machine is in the front office ready for pick up.

## 2023-09-26 DIAGNOSIS — M170 Bilateral primary osteoarthritis of knee: Secondary | ICD-10-CM | POA: Diagnosis not present

## 2023-09-26 DIAGNOSIS — M62838 Other muscle spasm: Secondary | ICD-10-CM | POA: Diagnosis not present

## 2023-09-29 ENCOUNTER — Ambulatory Visit: Admitting: Physician Assistant

## 2023-09-30 ENCOUNTER — Other Ambulatory Visit

## 2023-10-12 ENCOUNTER — Other Ambulatory Visit: Payer: Self-pay

## 2023-10-12 MED ORDER — OXYCODONE-ACETAMINOPHEN 5-325 MG PO TABS: 1.0000 | ORAL_TABLET | Freq: Three times a day (TID) | ORAL | 0 refills | Status: DC | PRN

## 2023-10-12 MED ORDER — BUPRENORPHINE 5 MCG/HR TD PTWK: 1.0000 | MEDICATED_PATCH | TRANSDERMAL | 0 refills | Status: DC

## 2023-10-13 ENCOUNTER — Ambulatory Visit (INDEPENDENT_AMBULATORY_CARE_PROVIDER_SITE_OTHER): Admitting: Nurse Practitioner

## 2023-10-13 ENCOUNTER — Ambulatory Visit

## 2023-10-13 VITALS — BP 124/74 | HR 76 | Temp 97.9°F | Ht 65.0 in | Wt 287.4 lb

## 2023-10-13 DIAGNOSIS — Z7985 Long-term (current) use of injectable non-insulin antidiabetic drugs: Secondary | ICD-10-CM | POA: Diagnosis not present

## 2023-10-13 DIAGNOSIS — M25561 Pain in right knee: Secondary | ICD-10-CM | POA: Diagnosis not present

## 2023-10-13 DIAGNOSIS — R8290 Unspecified abnormal findings in urine: Secondary | ICD-10-CM | POA: Diagnosis not present

## 2023-10-13 DIAGNOSIS — G8929 Other chronic pain: Secondary | ICD-10-CM

## 2023-10-13 DIAGNOSIS — M25562 Pain in left knee: Secondary | ICD-10-CM | POA: Diagnosis not present

## 2023-10-13 DIAGNOSIS — R399 Unspecified symptoms and signs involving the genitourinary system: Secondary | ICD-10-CM

## 2023-10-13 DIAGNOSIS — I10 Essential (primary) hypertension: Secondary | ICD-10-CM

## 2023-10-13 DIAGNOSIS — E119 Type 2 diabetes mellitus without complications: Secondary | ICD-10-CM | POA: Diagnosis not present

## 2023-10-13 LAB — URINALYSIS, ROUTINE W REFLEX MICROSCOPIC
Bilirubin Urine: NEGATIVE
Ketones, ur: NEGATIVE
Nitrite: POSITIVE — AB
Specific Gravity, Urine: 1.02 (ref 1.000–1.030)
Urine Glucose: NEGATIVE
Urobilinogen, UA: 0.2 (ref 0.0–1.0)
pH: 7.5 (ref 5.0–8.0)

## 2023-10-13 MED ORDER — TIRZEPATIDE 15 MG/0.5ML ~~LOC~~ SOAJ
15.0000 mg | SUBCUTANEOUS | 1 refills | Status: AC
Start: 2023-10-13 — End: ?

## 2023-10-13 NOTE — Progress Notes (Signed)
 Bluford Burkitt, NP-C Phone: [phone]  Sherry Price is a 53 y.o. female who presents today for follow up.   Discussed the use of AI scribe software for clinical note transcription with the patient, who gave verbal consent to proceed.  History of Present Illness   Sherry Price is a 53 year old female who presents for follow-up of urinary tract infection and proteinuria.  She was previously diagnosed with a urinary tract infection and treated with Macrobid , which resolved her symptoms. Currently, she has no urinary symptoms such as hematuria, dysuria, or increased frequency. She acknowledges inadequate water intake.  During her previous visit, lab results indicated proteinuria. She is on blood pressure medication, including a diuretic, necessitating adequate hydration. Her blood pressure was elevated during the last visit. She is also on Mounjaro , with a current dose of 12.5 mg.  She experiences knee swelling and pain, affecting her mobility and work. She uses a knee sleeve for support and occasionally requires a cane, which is not permitted at her workplace.  Her diet mainly consists of chicken and salads, avoiding fast food. She admits to not drinking enough water. She has not started checking her blood sugar at home but has the necessary equipment. No increased thirst, urination, or hypoglycemic symptoms, although she occasionally skips meals, potentially leading to low blood sugar.  She has scheduled an appointment with a therapist for July and has no other immediate health concerns. She does not monitor her blood pressure at home. No chest pain, shortness of breath, or ankle swelling.      Social History   Tobacco Use  Smoking Status Former   Types: Cigarettes  Smokeless Tobacco Never    Current Outpatient Medications on File Prior to Visit  Medication Sig Dispense Refill   ALPRAZolam  (XANAX ) 0.5 MG tablet TAKE 1 TABLET(0.5 MG) BY MOUTH DAILY AS NEEDED FOR ANXIETY 30  tablet 5   amLODipine  (NORVASC ) 10 MG tablet TAKE 1 TABLET(10 MG) BY MOUTH DAILY 90 tablet 3   Aspirin -Acetaminophen -Caffeine  (GOODY HEADACHE PO) Take 1 packet by mouth as needed.     atorvastatin (LIPITOR) 40 MG tablet Take 40 mg by mouth daily.     Blood Glucose Monitoring Suppl DEVI 1 each by Does not apply route in the morning, at noon, and at bedtime. May substitute to any manufacturer covered by patient's insurance. 1 each 0   calcium  carbonate (TUMS) 500 MG chewable tablet Chew 1 tablet (200 mg of elemental calcium  total) by mouth 2 (two) times daily. 20 tablet 0   cyclobenzaprine  (FLEXERIL ) 10 MG tablet Take 0.5-1 tablets (5-10 mg total) by mouth at bedtime. 30 tablet 1   Glucose Blood (BLOOD GLUCOSE TEST STRIPS) STRP 1 each by In Vitro route in the morning, at noon, and at bedtime. May substitute to any manufacturer covered by patient's insurance. 100 strip 0   hydrochlorothiazide  (HYDRODIURIL ) 25 MG tablet TAKE 1/2 TABLET(12.5 MG) BY MOUTH DAILY 45 tablet 3   labetalol  (NORMODYNE ) 200 MG tablet TAKE 1 TABLET(200 MG) BY MOUTH TWICE DAILY 180 tablet 3   Lancet Device MISC 1 each by Does not apply route in the morning, at noon, and at bedtime. May substitute to any manufacturer covered by patient's insurance. 1 each 0   Lancets Misc. MISC 1 each by Does not apply route in the morning, at noon, and at bedtime. May substitute to any manufacturer covered by patient's insurance. 100 each 0   mupirocin  ointment (BACTROBAN ) 2 % Apply 1 Application topically 2 (  two) times daily. 22 g 0   ondansetron  (ZOFRAN -ODT) 4 MG disintegrating tablet DISSOLVE 1 TO 2 TABLETS ON THE TONGUE EVERY 8 HOURS AS NEEDED FOR NAUSEA OR VOMITING 30 tablet 2   oxyCODONE -acetaminophen  (PERCOCET) 5-325 MG tablet Take 1 tablet by mouth every 8 (eight) hours as needed for severe pain (pain score 7-10). 90 tablet 0   venlafaxine  XR (EFFEXOR -XR) 150 MG 24 hr capsule Take 1 capsule (150 mg total) by mouth daily with breakfast. 90  capsule 3   No current facility-administered medications on file prior to visit.     ROS see history of present illness  Objective  Physical Exam Vitals:   10/13/23 0840  BP: 124/74  Pulse: 76  Temp: 97.9 F (36.6 C)  SpO2: 94%    BP Readings from Last 3 Encounters:  10/13/23 124/74  09/22/23 (!) 140/80  09/10/23 119/81   Wt Readings from Last 3 Encounters:  10/13/23 287 lb 6.4 oz (130.4 kg)  09/22/23 289 lb (131.1 kg)  09/10/23 287 lb (130.2 kg)    Physical Exam Constitutional:      General: She is not in acute distress.    Appearance: Normal appearance.  HENT:     Head: Normocephalic.   Cardiovascular:     Rate and Rhythm: Normal rate and regular rhythm.     Heart sounds: Normal heart sounds.  Pulmonary:     Effort: Pulmonary effort is normal.     Breath sounds: Normal breath sounds.   Skin:    General: Skin is warm and dry.   Neurological:     General: No focal deficit present.     Mental Status: She is alert.   Psychiatric:        Mood and Affect: Mood normal.        Behavior: Behavior normal.      Assessment/Plan: Please see individual problem list.  UTI symptoms Assessment & Plan: Previously treated with Macrobid , her symptoms have resolved. Proteinuria is noted, indicating potential kidney issues. Medication for kidney protection and diabetes medications were discussed. Urine test results will be reviewed, and a SGLT2 may be considered. Increased water intake is encouraged.  Orders: -     Urinalysis, Routine w reflex microscopic -     Urine Culture; Future  Type 2 diabetes mellitus without complication, without long-term current use of insulin (HCC) Assessment & Plan: Her A1c is 5.9, indicating good control. She is on Mounjaro  but experiences occasional hypoglycemia due to irregular eating. Increasing the Mounjaro  dose was discussed. She should continue Mounjaro  12.5 mg with the option to increase to 15 mg, maintain regular meals, and  monitor blood glucose at home.  Orders: -     Tirzepatide ; Inject 15 mg into the skin once a week.  Dispense: 6 mL; Refill: 1  Benign essential hypertension Assessment & Plan: Her blood pressure is well-controlled on the current regimen, though she does not report home monitoring. She should continue the current antihypertensive regimen and is encouraged to monitor her blood pressure at home regularly.   Bilateral chronic knee pain Assessment & Plan: Knee pain and swelling affect her mobility. She uses a knee sleeve, as compression stockings are not suitable for her knee issues. Continued use of the knee sleeve is recommended, follow up with pain management as scheduled for knee injections.        Return in about 3 months (around 01/13/2024) for Follow up.   Bluford Burkitt, NP-C [facility]

## 2023-10-15 ENCOUNTER — Ambulatory Visit: Payer: Self-pay | Admitting: Nurse Practitioner

## 2023-10-15 DIAGNOSIS — N3000 Acute cystitis without hematuria: Secondary | ICD-10-CM

## 2023-10-15 LAB — URINE CULTURE
MICRO NUMBER:: 16561494
SPECIMEN QUALITY:: ADEQUATE

## 2023-10-15 MED ORDER — CIPROFLOXACIN HCL 500 MG PO TABS: 500.0000 mg | ORAL_TABLET | Freq: Two times a day (BID) | ORAL | 0 refills | Status: AC

## 2023-10-21 ENCOUNTER — Encounter: Payer: Self-pay | Admitting: Nurse Practitioner

## 2023-10-21 NOTE — Assessment & Plan Note (Signed)
 Knee pain and swelling affect her mobility. She uses a knee sleeve, as compression stockings are not suitable for her knee issues. Continued use of the knee sleeve is recommended, follow up with pain management as scheduled for knee injections.

## 2023-10-21 NOTE — Assessment & Plan Note (Signed)
 Her blood pressure is well-controlled on the current regimen, though she does not report home monitoring. She should continue the current antihypertensive regimen and is encouraged to monitor her blood pressure at home regularly.

## 2023-10-21 NOTE — Assessment & Plan Note (Signed)
 Previously treated with Macrobid , her symptoms have resolved. Proteinuria is noted, indicating potential kidney issues. Medication for kidney protection and diabetes medications were discussed. Urine test results will be reviewed, and a SGLT2 may be considered. Increased water intake is encouraged.

## 2023-10-21 NOTE — Assessment & Plan Note (Signed)
 Her A1c is 5.9, indicating good control. She is on Mounjaro  but experiences occasional hypoglycemia due to irregular eating. Increasing the Mounjaro  dose was discussed. She should continue Mounjaro  12.5 mg with the option to increase to 15 mg, maintain regular meals, and monitor blood glucose at home.

## 2023-10-27 DIAGNOSIS — M62838 Other muscle spasm: Secondary | ICD-10-CM | POA: Diagnosis not present

## 2023-10-27 DIAGNOSIS — M170 Bilateral primary osteoarthritis of knee: Secondary | ICD-10-CM | POA: Diagnosis not present
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
## 2023-11-03 ENCOUNTER — Encounter: Payer: Self-pay | Admitting: Physical Medicine & Rehabilitation

## 2023-11-03 ENCOUNTER — Encounter: Attending: Physical Medicine & Rehabilitation | Admitting: Physical Medicine & Rehabilitation

## 2023-11-03 VITALS — BP 126/85 | HR 28 | Ht 65.0 in | Wt 283.0 lb

## 2023-11-03 DIAGNOSIS — Z5181 Encounter for therapeutic drug level monitoring: Secondary | ICD-10-CM | POA: Insufficient documentation

## 2023-11-03 DIAGNOSIS — M170 Bilateral primary osteoarthritis of knee: Secondary | ICD-10-CM | POA: Insufficient documentation

## 2023-11-03 DIAGNOSIS — G894 Chronic pain syndrome: Secondary | ICD-10-CM | POA: Insufficient documentation

## 2023-11-03 MED ORDER — SUZETRIGINE 50 MG PO TABS: 50.0000 mg | ORAL_TABLET | Freq: Two times a day (BID) | ORAL | 0 refills | Status: DC | PRN

## 2023-11-03 MED ORDER — BUPRENORPHINE 7.5 MCG/HR TD PTWK: 1.0000 | MEDICATED_PATCH | TRANSDERMAL | 0 refills | Status: DC

## 2023-11-03 NOTE — Progress Notes (Signed)
 Subjective:    Sherry Price ID: Pinkey Mcjunkin, female    DOB: [birth year], 53 y.o.   MRN: [id number]  HPI   HPI   Sherry Price is a 53 y.o. 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   They are presenting to PM&R clinic as a new Sherry Price for pain management evaluation. They were referred by Augustin Glance for bilateral knee pain.  Her pain is 7-8 out of 10 worsens with walking.  Sherry Price reports she has had bilateral knee pain for many years.  Pain is worsened with activity.  Sherry Price reports occasional right back pain mostly when ambulating longer distances.  Furthermore she also has pain due to muscle spasms in her left arm.  She thinks this is related to prior treatment of cancer of her left breast.  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  She typically walks without a cane for short distances but uses a walker for longer distances.   She has been seen by [facility] sports medicine.  She has received cortisone shots in her knees with benefit.  Cortisone does help her knee pain bilaterally however she feels like she is still very limited by her pain.  She reports her last knee injections were in March.  She says she had gel injections years ago  rooster comb and is not sure how these helped.  She did not continue these injections regularly at the time.   She reports previously using oxycodone /Xtampza  from [facility].  Sherry Price says she discontinued seeing [facility] because they used a different insurance for 1 visit resulting in a $300 bill.  As she was unable to pay this bill she was unable to continue following with [facility] medical.  She  feels like this provided her improved pain control and she was able to walk a lot further.      Red flag symptoms: No red flags for back pain endorsed in Hx or ROS   Medications tried:   Topical medications  Nsaids- ibuprofen - helps slightly, she is currently using Tylenol   -did not help much in the past Opiates  Oyxoconde and xtamaza -were helping control her pain until he would discontinued several months ago Tramadol- didn't help Hydrocodone- She doesn't recall Gabapentin / Lyrica - Denies use TCAs  - Denies use SNRIs -currently on venlafaxine      Other treatments: Knee braces help Sherry Price- few months ago, helped her pain  TENs unit- Denies  Injections- Gel injections? Vs cortisone didn't help- tried years ago , cortisone helps - last in march  Surgery- limted by wt, taking ozempic       Interval History 11/27/22 Sherry Price is here for bilateral cortisone knee injections.  She reports she has not had cortisone injection to her knees 3 months.  Reports cortisone injections were previously helpful to her pain.  She continues to work on losing weight.  Knee pain continues to be severe and limiting.  Oxycodone  ordered this week however she has not picked this up yet.  We discussed EtOH noted on prior UDS, she was not on oxycodone  at the time.  She agrees to discontinue use of alcohol since restarting this medication.   Interval History 01/29/23 Sherry Price is here for follow-up regarding her chronic knee pain.  Reports that knee brace and TENS unit are providing some benefit.  She went on a cruise earlier this month had a lot of increased pain due to activities.  She feels that she cannot do as much as she would like to do due to her knee pain.  Oxycodone  is helping but not lasting long enough.  Knee cortisone injection helped but did not last long enough.  She feels like these lasted just a few weeks.   Interval History 02/27/23 Sherry Price is here for b/l zilretta  injections today.  It has been 3  months since her last cortisone knee injections.  She reports her knees continue to cause significant pain and limiting her function.  Percocet 5 mg 3 times daily does help reduce her pain and keep her more comfortable.  She is not having any side effects of this medication.   Interval History 04/29/23 Sherry Price is here for follow-up regarding her chronic knee pain.  She reports its better since Zilretta  injections.  She feels like these are helping a lot more than short acting cortisone did.  Injection still helping but it is gradually decreasing.  Percocet is also helping keep her pain at a more tolerable level, no side effects with the medication.  She continues to work on weight loss.     Interval history 05/28/2023 Sherry Price reports the prior bilateral Zilretta  injections were helpful but have worn off.  She would like a repeat injection for both of her knees.  Percocet has been helping to keep her pain at a more tolerable level.  No side effects with the medication.   Interval history 06/11/23 Sherry Price reports worsening pain in her bilateral knees.  She is here for bilateral knee Zilretta  repeat injection today.  She feels like the cold weather has been worsening her knee pain as well.  She continues to use Percocet 5 mg that helps reduce her pain and allows her to be more functional.  She reports having hinged knee braces at home, she plans to also try a knee sleeve.  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   Interval history 08/06/2023 Sherry Price is here for follow-up regarding her chronic bilateral knee pain.  Sherry Price reports good results with Zilretta  injections, pain relief lasted for about 2 months but the pain started to come back.  She continues to use Percocet and this helps her pain but does not always last long enough.  She continues to work in a [employer] where she is still walk a lot putting pressure on her knees.     Interval history 09/10/23 Sherry Price is here for  bilateral knee injections with Zilretta .  She reports good benefit with prior injections.  She was in the ER last week for her knee pain was treated with ketorolac , meloxicam , Flexeril , few days of prednisone .  She thinks she may have overly stressed her knees somehow before this.  She has been out of oxycodone  and has been off buprenorphine  for a few days, last filled over a month ago.  She reports buprenorphine  was helping her overall pain and oxycodone  was helping breakthrough pain.  Sherry Price reports she has been having difficulty because her employer does not want her to use a cane at work.   Interval history 11/03/2023 Sherry Price reports continued pain in her knees, improved with Zilretta  but she continues to have pain.  Pain is worse on the left side recently.  She has been  using Butrans  and oxycodone  for her pain.  No side effects with the pain medications and reports they are helping decrease her pain.  Pill count was low, Sherry Price says she thought she could take the Percocet 4 times a day not 3 times a day.  Pain Inventory Average Pain 8 Pain Right Now 8 My pain is intermittent, sharp, and aching  In the last 24 hours, has pain interfered with the following? General activity 8 Relation with others 6 Enjoyment of life 8 What TIME of day is your pain at its worst? evening and varies Sleep (in general) Fair  Pain is worse with: walking and some activites Pain improves with: heat/ice, therapy/exercise, medication, and injections Relief from Meds: 6  Family History  Problem Relation Age of Onset   Depression Mother    Hypertension Mother    Heart disease Mother    Diabetes Mother    Alcohol abuse Father    Breast cancer Cousin    Social History   Socioeconomic History   Marital status: Single    Spouse name: Not on file   Number of children: Not on file   Years of education: Not on file   Highest education level: Not on file  Occupational History   Not on file  Tobacco Use    Smoking status: Former    Types: Cigarettes   Smokeless tobacco: Never  Vaping Use   Vaping status: Never Used  Substance and Sexual Activity   Alcohol use: Yes    Comment: occasionally   Drug use: No   Sexual activity: Not on file  Other Topics Concern   Not on file  Social History Narrative   Never married   Social Drivers of Corporate investment banker Strain: Low Risk  (03/11/2023)   Overall Financial Resource Strain (CARDIA)    Difficulty of Paying Living Expenses: Not hard at all  Food Insecurity: No Food Insecurity (03/11/2023)   Hunger Vital Sign    Worried About Running Out of Food in the Last Year: Never true    Ran Out of Food in the Last Year: Never true  Transportation Needs: No Transportation Needs (03/11/2023)   PRAPARE - Administrator, Civil Service (Medical): No    Lack of Transportation (Non-Medical): No  Physical Activity: Inactive (03/11/2023)   Exercise Vital Sign    Days of Exercise per Week: 0 days    Minutes of Exercise per Session: 0 min  Stress: Stress Concern Present (03/11/2023)   [employer] of Occupational Health - Occupational Stress Questionnaire    Feeling of Stress : Rather much  Social Connections: Moderately Isolated (03/11/2023)   Social Connection and Isolation Panel    Frequency of Communication with Friends and Family: More than three times a week    Frequency of Social Gatherings with Friends and Family: Once a week    Attends Religious Services: More than 4 times per year    Active Member of [employer] or Organizations: No    Attends Banker Meetings: Never    Marital Status: Never married   Past Surgical History:  Procedure Laterality Date   BREAST BIOPSY Left 2013   +   BREAST BIOPSY Right    neg   BREAST LUMPECTOMY  2013   BREAST MAMMOSITE Left February 2013   BREAST REDUCTION SURGERY  2005   COLONOSCOPY WITH PROPOFOL  N/A 01/09/2015   Procedure: COLONOSCOPY WITH PROPOFOL ;  Surgeon: Rogelia Copping, MD;   Location: [facility] ENDOSCOPY;  Service: Endoscopy;  Laterality: N/A;   ESOPHAGOGASTRODUODENOSCOPY (EGD) WITH PROPOFOL  N/A 01/09/2015   Procedure: ESOPHAGOGASTRODUODENOSCOPY (EGD) WITH PROPOFOL ;  Surgeon: Rogelia Copping, MD;  Location: [facility] ENDOSCOPY;  Service: Endoscopy;  Laterality: N/A;   OOPHORECTOMY Right 2013   REDUCTION MAMMAPLASTY     Past Surgical History:  Procedure Laterality Date   BREAST BIOPSY Left 2013   +   BREAST BIOPSY Right    neg   BREAST LUMPECTOMY  2013   BREAST MAMMOSITE Left February 2013   BREAST REDUCTION SURGERY  2005   COLONOSCOPY WITH PROPOFOL  N/A 01/09/2015   Procedure: COLONOSCOPY WITH PROPOFOL ;  Surgeon: Rogelia Copping, MD;  Location: [facility] ENDOSCOPY;  Service: Endoscopy;  Laterality: N/A;   ESOPHAGOGASTRODUODENOSCOPY (EGD) WITH PROPOFOL  N/A 01/09/2015   Procedure: ESOPHAGOGASTRODUODENOSCOPY (EGD) WITH PROPOFOL ;  Surgeon: Rogelia Copping, MD;  Location: [facility] ENDOSCOPY;  Service: Endoscopy;  Laterality: N/A;   OOPHORECTOMY Right 2013   REDUCTION MAMMAPLASTY     Past Medical History:  Diagnosis Date   Arthritis    Breast cancer (HCC)    left breast - chemo and radiation   Diabetes mellitus without complication (HCC)    Hypertension    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   Panic attacks    Personal history of chemotherapy    Personal history of radiation therapy    Sleep apnea    BP 126/85 (BP Location: Left Arm, Sherry Price Position: Sitting)   Pulse (!) 28   Ht 5' 5 (1.651 m)   Wt 283 lb (128.4 kg)   LMP  (LMP Unknown)   SpO2 93%   BMI 47.09 kg/m   Opioid Risk Score:   Fall Risk Score:  `1  Depression screen [facility] 2/9     11/03/2023    9:59 AM 09/22/2023    9:07 AM 09/10/2023   11:10 AM 06/19/2023    9:51 AM 06/11/2023   10:54 AM 05/21/2023   11:06 AM 03/27/2023    8:30 AM  Depression screen PHQ 2/9  Decreased  Interest 0 1 0 1 1 1 1   Down, Depressed, Hopeless 0 1 0 2 1 2 1   PHQ - 2 Score 0 2 0 3 2 3 2   Altered sleeping 0 1  3  2 2   Tired, decreased energy 0 0  2  2 2   Change in appetite 0 0  1  1 1   Feeling bad or failure about yourself  0 2  1  1 1   Trouble concentrating 0 2  3  1 2   Moving slowly or fidgety/restless 0 0  1  0 1  Suicidal thoughts    0  0 0  PHQ-9 Score 0 7  14  10 11   Difficult doing work/chores  Somewhat difficult  Somewhat difficult  Somewhat difficult Very difficult    Review of Systems  All other systems reviewed and are negative.      Objective:   Physical Exam  Gen: no distress, normal appearing, obese HEENT: oral mucosa pink and moist, NCAT Chest: normal effort, normal rate of breathing Abd: soft, non-distended Psych: pleasant, normal affect Skin: intact Neuro: Alert and awake, follows commands, cranial nerves II through XII grossly intact, normal speech and language No focal motor or sensory deficits noted Musculoskeletal:  TTP at joint line bilateral knees, left greater than right Mild knee swelling a little  more on the left side Mild pain with PROM of both knees Antalgic gait, walking with a cane        Assessment & Plan:    Bilateral knee OA -Opiate risk tool moderate -Previously on Xtampza  and oxycodone  as needed at different pain clinic -Advised to request records from [facility]- pending -ETOH on prior UDS- discussed DC and she agrees. This UDS was prior to her starting on oxycodone  -Monitor routine pill counts, PDMP, UDS -Could consider gel injection for referral for genicular nerve block if poor results -She reports benefit with knee brace and TENS unit -Continue Percocet  5 mg 3 times daily as needed for breakthrough pain -Warning letter sent previously for not bringing in pain medication.  Sherry Price out of medication today which is consistent with her last refill -Discussed that any further forgetting of pills or inconsistent  pill counts, will be non opioid status -Increase Butrans  7.5 mcg/h patch -Sherry Price would like to avoid knee replacement    Muscles spasms L arm  -Improved, continue Magnesium supplementation   Morbid obesity -She says she is still working on losing weight -Provided list of foods for chronic pain and weight loss prior visit   Warnings Will DC oxycodone  due to inconsistencies with pill count, Sherry Price reports she thought she could take this medication 4 times a day 7/1.  Medication was ordered for 3 times a day. No short acting opioids / nonopioid status other than buprenorphine  going forward.  If any further inconsistencies with buprenorphine  patch counts we will discontinue this as well.

## 2023-11-04 ENCOUNTER — Telehealth: Payer: Self-pay

## 2023-11-04 NOTE — Telephone Encounter (Signed)
 Copied from CRM [id number]. Topic: Clinical - Medication Question >> Nov 04, 2023  3:39 PM Aisha D wrote: Reason for CRM: Pt is requesting to speak with Leron Gretel PIETY , or her nurse regarding a UTI. Pt stated that she tried to schedule an appt but provider didn't have any soon availability and wants to know if she can be prescribed an antibiotic for the UTI.

## 2023-11-05 ENCOUNTER — Ambulatory Visit (INDEPENDENT_AMBULATORY_CARE_PROVIDER_SITE_OTHER): Admitting: Psychiatry

## 2023-11-05 ENCOUNTER — Encounter: Payer: Self-pay | Admitting: Psychiatry

## 2023-11-05 ENCOUNTER — Other Ambulatory Visit: Payer: Self-pay

## 2023-11-05 ENCOUNTER — Ambulatory Visit: Payer: Self-pay

## 2023-11-05 VITALS — BP 130/86 | HR 97 | Temp 97.3°F | Ht 65.0 in | Wt 285.8 lb

## 2023-11-05 DIAGNOSIS — F331 Major depressive disorder, recurrent, moderate: Secondary | ICD-10-CM

## 2023-11-05 DIAGNOSIS — F4310 Post-traumatic stress disorder, unspecified: Secondary | ICD-10-CM

## 2023-11-05 DIAGNOSIS — F1090 Alcohol use, unspecified, uncomplicated: Secondary | ICD-10-CM

## 2023-11-05 DIAGNOSIS — Z79899 Other long term (current) drug therapy: Secondary | ICD-10-CM

## 2023-11-05 DIAGNOSIS — G4700 Insomnia, unspecified: Secondary | ICD-10-CM

## 2023-11-05 MED ORDER — VENLAFAXINE HCL ER 75 MG PO CP24: 75.0000 mg | ORAL_CAPSULE | Freq: Every day | ORAL | 1 refills | Status: DC

## 2023-11-05 NOTE — Telephone Encounter (Signed)
 Agreed. Pt needs to get evaluated in the UC or ED.

## 2023-11-05 NOTE — Telephone Encounter (Signed)
 Called Patient to let her know that Chelsea Aurora did say she needs to be evaluated at [facility] or the ED.

## 2023-11-05 NOTE — Telephone Encounter (Signed)
 FYI.  No availability in our office.    Patient advised that the recommendation is that she is seen/evaluated within 24 hours by a provider. No appointments available in the PCP office. Patient states she may go to Urgent Care in the next day or two and she states the symptoms aren't that bad yet She is advised that if anything worsens to go to the Emergency Room---She verbalized understanding.

## 2023-11-05 NOTE — Telephone Encounter (Signed)
 Patient states she would like to be contacted if an appointment becomes available before she gets to an Urgent Care  FYI Only or Action Required?: Action required by provider: request for appointment and update on patient condition.  Patient was last seen in primary care on 10/13/2023 by [name], NP. Called Nurse Triage reporting Urinary Frequency. Symptoms began about 5 days ago. Interventions attempted: Nothing. Symptoms are: gradually worsening.  Triage Disposition: See Physician Within 24 Hours  Patient/caregiver understands and will follow disposition?: Unsure---Patient states she may go to Urgent Care in a day or two after the 4th---She states it isnt bad yet                        Copied from CRM [id number]. Topic: Clinical - Red Word Triage >> Nov 05, 2023 10:09 AM Revonda D wrote: Red Word that prompted transfer to Nurse Triage: Cramping on side  Pt stated that she thinks she has a UTI and would like to be prescribed some antibiotics. Pt stated that she is experiencing frequent urination, cramping on side and dark/cloudy urine. Pt wanted to schedule an appt but the soonest availability was 7/24 and pt doesn't want to wait that long. Reason for Disposition . Urinating more frequently than usual (i.e., frequency)  Answer Assessment - Initial Assessment Questions 1. SYMPTOM: What's the main symptom you're concerned about? (e.g., frequency, incontinence)     Frequency, cloudy urine 2. ONSET: When did the  urinary frequency, cloudy urine  start?     5 days ago 3. PAIN: Is there any pain? If Yes, ask: How bad is it? (Scale: 1-10; mild, moderate, severe)     2 4. CAUSE: What do you think is causing the symptoms?     UTI 5. OTHER SYMPTOMS: Do you have any other symptoms? (e.g., blood in urine, fever, flank pain, pain with urination)     Cloudy urine, cramping in bladder area off and on 6. PREGNANCY: Is there any chance you are pregnant? When was your  last menstrual period?     Breast cancer 13 years ago and periods stopped then    Patient advised that the recommendation is that she is seen/evaluated within 24 hours by a provider. No appointments available in the PCP office. Patient states she may go to Urgent Care in the next day or two and she states the symptoms aren't that bad yet She is advised that if anything worsens to go to the Emergency Room---She verbalized understanding.  Protocols used: Urinary Symptoms-A-AH

## 2023-11-05 NOTE — Patient Instructions (Signed)
 Increase venlafaxine  225 mg daily (Continue bupropion  150 mg daily) Obtain lab (TSH, CMP) After reviewing labs, will plan to start naltrexone 25 mg at night  Referral to therapy, [facility] first Next appointment- 8/7 at 10:30

## 2023-11-05 NOTE — Telephone Encounter (Signed)
 See other triage note.

## 2023-11-18 ENCOUNTER — Other Ambulatory Visit: Payer: Self-pay | Admitting: Nurse Practitioner

## 2023-11-18 DIAGNOSIS — F419 Anxiety disorder, unspecified: Secondary | ICD-10-CM

## 2023-11-19 DIAGNOSIS — R300 Dysuria: Secondary | ICD-10-CM | POA: Diagnosis not present

## 2023-11-19 DIAGNOSIS — M1712 Unilateral primary osteoarthritis, left knee: Secondary | ICD-10-CM | POA: Diagnosis not present

## 2023-11-26 DIAGNOSIS — M170 Bilateral primary osteoarthritis of knee: Secondary | ICD-10-CM | POA: Diagnosis not present

## 2023-11-26 DIAGNOSIS — M62838 Other muscle spasm: Secondary | ICD-10-CM | POA: Diagnosis not present

## 2023-11-27 ENCOUNTER — Inpatient Hospital Stay: Admitting: Nurse Practitioner

## 2023-11-27 NOTE — Progress Notes (Deleted)
  Leron Glance, NP-C Phone: [phone]  Sherry Price is a 53 y.o. female who presents today for ***  ***  Social History   Tobacco Use  Smoking Status Former   Types: Cigarettes  Smokeless Tobacco Never    Current Outpatient Medications on File Prior to Visit  Medication Sig Dispense Refill   ALPRAZolam  (XANAX ) 0.5 MG tablet TAKE 1 TABLET(0.5 MG) BY MOUTH DAILY AS NEEDED FOR ANXIETY 30 tablet 5   amLODipine  (NORVASC ) 10 MG tablet TAKE 1 TABLET(10 MG) BY MOUTH DAILY 90 tablet 3   Aspirin -Acetaminophen -Caffeine  (GOODY HEADACHE PO) Take 1 packet by mouth as needed.     atorvastatin (LIPITOR) 40 MG tablet Take 40 mg by mouth daily.     Blood Glucose Monitoring Suppl DEVI 1 each by Does not apply route in the morning, at noon, and at bedtime. May substitute to any manufacturer covered by patient's insurance. 1 each 0   buprenorphine  (BUTRANS ) 7.5 MCG/HR Place 1 patch onto the skin once a week. 4 patch 0   calcium  carbonate (TUMS) 500 MG chewable tablet Chew 1 tablet (200 mg of elemental calcium  total) by mouth 2 (two) times daily. 20 tablet 0   hydrochlorothiazide  (HYDRODIURIL ) 25 MG tablet TAKE 1/2 TABLET(12.5 MG) BY MOUTH DAILY 45 tablet 3   labetalol  (NORMODYNE ) 200 MG tablet TAKE 1 TABLET(200 MG) BY MOUTH TWICE DAILY 180 tablet 3   mupirocin  ointment (BACTROBAN ) 2 % Apply 1 Application topically 2 (two) times daily. 22 g 0   ondansetron  (ZOFRAN -ODT) 4 MG disintegrating tablet DISSOLVE 1 TO 2 TABLETS ON THE TONGUE EVERY 8 HOURS AS NEEDED FOR NAUSEA OR VOMITING 30 tablet 2   Suzetrigine  50 MG TABS Take 50 mg by mouth every 12 (twelve) hours as needed. Take two 50mg  capsules for your first dose at the start of a pain episode on an empty stomach, followed by 50mg  (1 capsule)  with or without food every 12 hours until pain episode is improved 29 tablet 0   tirzepatide  (MOUNJARO ) 15 MG/0.5ML Pen Inject 15 mg into the skin once a week. 6 mL 1   venlafaxine  XR (EFFEXOR -XR) 150 MG 24 hr  capsule Take 1 capsule (150 mg total) by mouth daily with breakfast. 90 capsule 3   venlafaxine  XR (EFFEXOR -XR) 75 MG 24 hr capsule Take 1 capsule (75 mg total) by mouth daily with breakfast. Take total of 225 mg daily, take along with 150 mg cap 30 capsule 1   No current facility-administered medications on file prior to visit.     ROS see history of present illness  Objective  Physical Exam There were no vitals filed for this visit.  BP Readings from Last 3 Encounters:  11/03/23 126/85  10/13/23 124/74  09/22/23 (!) 140/80   Wt Readings from Last 3 Encounters:  11/03/23 283 lb (128.4 kg)  10/13/23 287 lb 6.4 oz (130.4 kg)  09/22/23 289 lb (131.1 kg)    Physical Exam   Assessment/Plan: Please see individual problem list.  There are no diagnoses linked to this encounter.   Health Maintenance: ***  No follow-ups on file.   Leron Glance, NP-C [facility]

## 2023-12-05 DIAGNOSIS — M25561 Pain in right knee: Secondary | ICD-10-CM | POA: Diagnosis not present

## 2023-12-05 DIAGNOSIS — N3090 Cystitis, unspecified without hematuria: Secondary | ICD-10-CM | POA: Diagnosis not present

## 2023-12-05 DIAGNOSIS — M25562 Pain in left knee: Secondary | ICD-10-CM | POA: Diagnosis not present

## 2023-12-05 DIAGNOSIS — G8929 Other chronic pain: Secondary | ICD-10-CM | POA: Diagnosis not present
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
## 2023-12-07 ENCOUNTER — Telehealth: Payer: Self-pay | Admitting: Physical Medicine & Rehabilitation

## 2023-12-07 MED ORDER — OXYCODONE-ACETAMINOPHEN 5-325 MG PO TABS: 1.0000 | ORAL_TABLET | Freq: Three times a day (TID) | ORAL | 0 refills | Status: DC | PRN

## 2023-12-07 MED ORDER — BUPRENORPHINE 7.5 MCG/HR TD PTWK: 1.0000 | MEDICATED_PATCH | TRANSDERMAL | 0 refills | Status: DC

## 2023-12-07 NOTE — Telephone Encounter (Signed)
 Patient would like to speak to provider, she was seen this weekend in the ED and would like a prescription, not further details left. Sherry Price

## 2023-12-10 ENCOUNTER — Other Ambulatory Visit: Payer: Self-pay

## 2023-12-10 ENCOUNTER — Ambulatory Visit (INDEPENDENT_AMBULATORY_CARE_PROVIDER_SITE_OTHER): Admitting: Psychiatry

## 2023-12-10 ENCOUNTER — Encounter: Payer: Self-pay | Admitting: Psychiatry

## 2023-12-10 ENCOUNTER — Other Ambulatory Visit: Payer: Self-pay | Admitting: Psychiatry

## 2023-12-10 VITALS — BP 123/82 | HR 89 | Temp 97.4°F | Ht 65.0 in | Wt 306.0 lb

## 2023-12-10 DIAGNOSIS — F331 Major depressive disorder, recurrent, moderate: Secondary | ICD-10-CM | POA: Diagnosis not present

## 2023-12-10 DIAGNOSIS — F1090 Alcohol use, unspecified, uncomplicated: Secondary | ICD-10-CM

## 2023-12-10 DIAGNOSIS — G4700 Insomnia, unspecified: Secondary | ICD-10-CM

## 2023-12-10 DIAGNOSIS — F4310 Post-traumatic stress disorder, unspecified: Secondary | ICD-10-CM

## 2023-12-10 DIAGNOSIS — M25562 Pain in left knee: Secondary | ICD-10-CM | POA: Diagnosis not present

## 2023-12-10 DIAGNOSIS — M1712 Unilateral primary osteoarthritis, left knee: Secondary | ICD-10-CM | POA: Diagnosis not present

## 2023-12-10 DIAGNOSIS — Z79899 Other long term (current) drug therapy: Secondary | ICD-10-CM

## 2023-12-10 MED ORDER — PRAZOSIN HCL 1 MG PO CAPS
1.0000 mg | ORAL_CAPSULE | Freq: Every day | ORAL | 1 refills | Status: DC
Start: 2023-12-10 — End: 2023-12-23

## 2023-12-10 MED ORDER — VENLAFAXINE HCL ER 75 MG PO CP24: 75.0000 mg | ORAL_CAPSULE | Freq: Every day | ORAL | 1 refills | Status: DC

## 2023-12-10 NOTE — Patient Instructions (Addendum)
 Continue venlafaxine  225 mg daily Start prazosin  1 mg night  Obtain lab (TSH, CMP) After reviewing labs, will plan to start acamprosate 333 mg three times a day Next appointment- 9/25 at 11 am

## 2023-12-17 ENCOUNTER — Emergency Department

## 2023-12-17 ENCOUNTER — Other Ambulatory Visit: Payer: Self-pay

## 2023-12-17 ENCOUNTER — Inpatient Hospital Stay
Admission: EM | Admit: 2023-12-17 | Discharge: 2023-12-23 | DRG: 176 | Disposition: A | Discharge status: Skilled Nursing Facility

## 2023-12-17 DIAGNOSIS — Z803 Family history of malignant neoplasm of breast: Secondary | ICD-10-CM

## 2023-12-17 DIAGNOSIS — R2681 Unsteadiness on feet: Secondary | ICD-10-CM | POA: Diagnosis not present

## 2023-12-17 DIAGNOSIS — E785 Hyperlipidemia, unspecified: Secondary | ICD-10-CM | POA: Diagnosis present

## 2023-12-17 DIAGNOSIS — Z811 Family history of alcohol abuse and dependence: Secondary | ICD-10-CM

## 2023-12-17 DIAGNOSIS — M25512 Pain in left shoulder: Secondary | ICD-10-CM | POA: Diagnosis not present

## 2023-12-17 DIAGNOSIS — R110 Nausea: Secondary | ICD-10-CM | POA: Diagnosis not present

## 2023-12-17 DIAGNOSIS — R531 Weakness: Secondary | ICD-10-CM | POA: Diagnosis present

## 2023-12-17 DIAGNOSIS — E66813 Obesity, class 3: Secondary | ICD-10-CM | POA: Diagnosis present

## 2023-12-17 DIAGNOSIS — M25562 Pain in left knee: Secondary | ICD-10-CM | POA: Diagnosis present

## 2023-12-17 DIAGNOSIS — Z833 Family history of diabetes mellitus: Secondary | ICD-10-CM

## 2023-12-17 DIAGNOSIS — M79605 Pain in left leg: Secondary | ICD-10-CM | POA: Diagnosis not present

## 2023-12-17 DIAGNOSIS — Z86711 Personal history of pulmonary embolism: Secondary | ICD-10-CM | POA: Diagnosis not present

## 2023-12-17 DIAGNOSIS — E569 Vitamin deficiency, unspecified: Secondary | ICD-10-CM | POA: Diagnosis not present

## 2023-12-17 DIAGNOSIS — F419 Anxiety disorder, unspecified: Secondary | ICD-10-CM | POA: Diagnosis present

## 2023-12-17 DIAGNOSIS — Z736 Limitation of activities due to disability: Secondary | ICD-10-CM | POA: Diagnosis not present

## 2023-12-17 DIAGNOSIS — Z8249 Family history of ischemic heart disease and other diseases of the circulatory system: Secondary | ICD-10-CM

## 2023-12-17 DIAGNOSIS — I10 Essential (primary) hypertension: Secondary | ICD-10-CM | POA: Diagnosis present

## 2023-12-17 DIAGNOSIS — R5381 Other malaise: Secondary | ICD-10-CM | POA: Diagnosis present

## 2023-12-17 DIAGNOSIS — C50419 Malignant neoplasm of upper-outer quadrant of unspecified female breast: Secondary | ICD-10-CM | POA: Diagnosis present

## 2023-12-17 DIAGNOSIS — R000 Tachycardia, unspecified: Secondary | ICD-10-CM | POA: Diagnosis present

## 2023-12-17 DIAGNOSIS — R079 Chest pain, unspecified: Secondary | ICD-10-CM | POA: Diagnosis not present

## 2023-12-17 DIAGNOSIS — Z751 Person awaiting admission to adequate facility elsewhere: Secondary | ICD-10-CM

## 2023-12-17 DIAGNOSIS — Z818 Family history of other mental and behavioral disorders: Secondary | ICD-10-CM

## 2023-12-17 DIAGNOSIS — I2699 Other pulmonary embolism without acute cor pulmonale: Secondary | ICD-10-CM | POA: Diagnosis present

## 2023-12-17 DIAGNOSIS — Z9221 Personal history of antineoplastic chemotherapy: Secondary | ICD-10-CM | POA: Diagnosis not present

## 2023-12-17 DIAGNOSIS — Z6841 Body Mass Index (BMI) 40.0 and over, adult: Secondary | ICD-10-CM | POA: Diagnosis not present

## 2023-12-17 DIAGNOSIS — K5900 Constipation, unspecified: Secondary | ICD-10-CM | POA: Diagnosis present

## 2023-12-17 DIAGNOSIS — M1711 Unilateral primary osteoarthritis, right knee: Secondary | ICD-10-CM | POA: Diagnosis present

## 2023-12-17 DIAGNOSIS — E876 Hypokalemia: Secondary | ICD-10-CM | POA: Diagnosis present

## 2023-12-17 DIAGNOSIS — R0789 Other chest pain: Secondary | ICD-10-CM | POA: Diagnosis not present

## 2023-12-17 DIAGNOSIS — E118 Type 2 diabetes mellitus with unspecified complications: Secondary | ICD-10-CM | POA: Diagnosis not present

## 2023-12-17 DIAGNOSIS — F32A Depression, unspecified: Secondary | ICD-10-CM | POA: Diagnosis present

## 2023-12-17 DIAGNOSIS — Z853 Personal history of malignant neoplasm of breast: Secondary | ICD-10-CM

## 2023-12-17 DIAGNOSIS — Z743 Need for continuous supervision: Secondary | ICD-10-CM | POA: Diagnosis not present

## 2023-12-17 DIAGNOSIS — M1990 Unspecified osteoarthritis, unspecified site: Secondary | ICD-10-CM

## 2023-12-17 DIAGNOSIS — G4733 Obstructive sleep apnea (adult) (pediatric): Secondary | ICD-10-CM | POA: Diagnosis present

## 2023-12-17 DIAGNOSIS — Z90721 Acquired absence of ovaries, unilateral: Secondary | ICD-10-CM

## 2023-12-17 DIAGNOSIS — G894 Chronic pain syndrome: Secondary | ICD-10-CM | POA: Diagnosis present

## 2023-12-17 DIAGNOSIS — Z7985 Long-term (current) use of injectable non-insulin antidiabetic drugs: Secondary | ICD-10-CM | POA: Diagnosis not present

## 2023-12-17 DIAGNOSIS — Z87891 Personal history of nicotine dependence: Secondary | ICD-10-CM

## 2023-12-17 DIAGNOSIS — Z7982 Long term (current) use of aspirin: Secondary | ICD-10-CM

## 2023-12-17 DIAGNOSIS — E119 Type 2 diabetes mellitus without complications: Secondary | ICD-10-CM | POA: Diagnosis present

## 2023-12-17 DIAGNOSIS — Z9012 Acquired absence of left breast and nipple: Secondary | ICD-10-CM | POA: Diagnosis not present

## 2023-12-17 DIAGNOSIS — Z923 Personal history of irradiation: Secondary | ICD-10-CM

## 2023-12-17 DIAGNOSIS — R9431 Abnormal electrocardiogram [ECG] [EKG]: Secondary | ICD-10-CM | POA: Diagnosis not present

## 2023-12-17 DIAGNOSIS — D72829 Elevated white blood cell count, unspecified: Secondary | ICD-10-CM | POA: Diagnosis present

## 2023-12-17 DIAGNOSIS — R0902 Hypoxemia: Secondary | ICD-10-CM | POA: Diagnosis present

## 2023-12-17 DIAGNOSIS — I2693 Single subsegmental pulmonary embolism without acute cor pulmonale: Principal | ICD-10-CM | POA: Diagnosis present

## 2023-12-17 DIAGNOSIS — R69 Illness, unspecified: Secondary | ICD-10-CM | POA: Diagnosis not present

## 2023-12-17 DIAGNOSIS — R0602 Shortness of breath: Secondary | ICD-10-CM | POA: Diagnosis not present

## 2023-12-17 DIAGNOSIS — M7989 Other specified soft tissue disorders: Secondary | ICD-10-CM | POA: Diagnosis not present

## 2023-12-17 DIAGNOSIS — R0600 Dyspnea, unspecified: Secondary | ICD-10-CM | POA: Diagnosis not present

## 2023-12-17 DIAGNOSIS — Z888 Allergy status to other drugs, medicaments and biological substances status: Secondary | ICD-10-CM

## 2023-12-17 DIAGNOSIS — Z79899 Other long term (current) drug therapy: Secondary | ICD-10-CM

## 2023-12-17 DIAGNOSIS — Z170 Estrogen receptor positive status [ER+]: Secondary | ICD-10-CM | POA: Diagnosis not present

## 2023-12-17 DIAGNOSIS — M6259 Muscle wasting and atrophy, not elsewhere classified, multiple sites: Secondary | ICD-10-CM | POA: Diagnosis not present

## 2023-12-17 LAB — BASIC METABOLIC PANEL WITH GFR
Anion gap: 12 (ref 5–15)
BUN: 8 mg/dL (ref 6–20)
CO2: 24 mmol/L (ref 22–32)
Calcium: 9.3 mg/dL (ref 8.9–10.3)
Chloride: 102 mmol/L (ref 98–111)
Creatinine, Ser: 0.7 mg/dL (ref 0.44–1.00)
GFR, Estimated: >60 mL/min (ref >60)
Glucose, Bld: 139 mg/dL — ABNORMAL HIGH (ref 70–99)
Potassium: 3.7 mmol/L (ref 3.5–5.1)
Sodium: 138 mmol/L (ref 135–145)

## 2023-12-17 LAB — CBC
HCT: 39 % (ref 36.0–46.0)
Hemoglobin: 13.1 g/dL (ref 12.0–15.0)
MCH: 32.2 pg (ref 26.0–34.0)
MCHC: 33.6 g/dL (ref 30.0–36.0)
MCV: 95.8 fL (ref 80.0–100.0)
Platelets: 363 K/uL (ref 150–400)
RBC: 4.07 MIL/uL (ref 3.87–5.11)
RDW: 13.6 % (ref 11.5–15.5)
WBC: 7.9 K/uL (ref 4.0–10.5)
nRBC: 0 % (ref 0.0–0.2)

## 2023-12-17 LAB — CBG MONITORING, ED
Glucose-Capillary: 82 mg/dL (ref 70–99)
Glucose-Capillary: 102 mg/dL — ABNORMAL HIGH (ref 70–99)

## 2023-12-17 LAB — APTT: aPTT: 28 s (ref 24–36)

## 2023-12-17 LAB — TROPONIN I (HIGH SENSITIVITY): Troponin I (High Sensitivity): 3 ng/L (ref <18)

## 2023-12-17 LAB — PROTIME-INR
INR: 1 (ref 0.8–1.2)
Prothrombin Time: 14.1 s (ref 11.4–15.2)

## 2023-12-17 LAB — BRAIN NATRIURETIC PEPTIDE: B Natriuretic Peptide: 22.2 pg/mL (ref 0.0–100.0)

## 2023-12-17 LAB — D-DIMER, QUANTITATIVE: D-Dimer, Quant: 0.91 ug{FEU}/mL — ABNORMAL HIGH (ref 0.00–0.50)

## 2023-12-17 MED ORDER — IOHEXOL 350 MG/ML SOLN
100.0000 mL | Freq: Once | INTRAVENOUS | Status: AC | PRN
  Administered 2023-12-17: 100 mL via INTRAVENOUS

## 2023-12-17 MED ORDER — HEPARIN BOLUS VIA INFUSION
6000.0000 [IU] | Freq: Once | INTRAVENOUS | Status: AC
  Administered 2023-12-17: 6000 [IU] via INTRAVENOUS
  Filled 2023-12-17: qty 6000

## 2023-12-17 MED ORDER — INSULIN ASPART 100 UNIT/ML IJ SOLN
0.0000 [IU] | Freq: Three times a day (TID) | INTRAMUSCULAR | Status: DC
  Administered 2023-12-18 – 2023-12-23 (×8): 1 [IU] via SUBCUTANEOUS
  Filled 2023-12-17 (×8): qty 1

## 2023-12-17 MED ORDER — ALBUTEROL SULFATE (2.5 MG/3ML) 0.083% IN NEBU: 2.5000 mg | INHALATION_SOLUTION | RESPIRATORY_TRACT | Status: DC | PRN

## 2023-12-17 MED ORDER — MORPHINE SULFATE (PF) 2 MG/ML IV SOLN
2.0000 mg | INTRAVENOUS | Status: DC | PRN
  Administered 2023-12-17 – 2023-12-23 (×14): 2 mg via INTRAVENOUS
  Filled 2023-12-17 (×14): qty 1

## 2023-12-17 MED ORDER — ACETAMINOPHEN 650 MG RE SUPP: 650.0000 mg | Freq: Four times a day (QID) | RECTAL | Status: DC | PRN

## 2023-12-17 MED ORDER — ONDANSETRON HCL 4 MG/2ML IJ SOLN
4.0000 mg | Freq: Four times a day (QID) | INTRAMUSCULAR | Status: DC | PRN
  Administered 2023-12-17 – 2023-12-22 (×2): 4 mg via INTRAVENOUS
  Filled 2023-12-17 (×2): qty 2

## 2023-12-17 MED ORDER — HEPARIN (PORCINE) 25000 UT/250ML-% IV SOLN
1800.0000 [IU]/h | INTRAVENOUS | Status: DC
  Administered 2023-12-17: 1800 [IU]/h via INTRAVENOUS
  Filled 2023-12-17: qty 250

## 2023-12-17 MED ORDER — ACETAMINOPHEN 325 MG PO TABS
650.0000 mg | ORAL_TABLET | Freq: Four times a day (QID) | ORAL | Status: DC | PRN
  Administered 2023-12-17 – 2023-12-19 (×2): 650 mg via ORAL
  Filled 2023-12-17 (×2): qty 2

## 2023-12-17 MED ORDER — ONDANSETRON HCL 4 MG PO TABS: 4.0000 mg | ORAL_TABLET | Freq: Four times a day (QID) | ORAL | Status: DC | PRN

## 2023-12-17 NOTE — ED Triage Notes (Signed)
 C/O left sided chest pain radiates down left arm x 2 days with nausea and SOB. Hx of left sided breast cancer.

## 2023-12-17 NOTE — Consult Note (Signed)
 PHARMACY - ANTICOAGULATION CONSULT NOTE  Pharmacy Consult for IV Heparin  Indication: pulmonary embolus  Patient Measurements: Height: 5' 7 (170.2 cm) Weight: (!) 139.3 kg (307 lb) IBW/kg (Calculated) : 61.6 HEPARIN  DW (KG): 95.7  Labs: Recent Labs    12/17/23 1248  HGB 13.1  HCT 39.0  PLT 363  CREATININE 0.70  TROPONINIHS 3    Estimated Creatinine Clearance: 116.3 mL/min (by C-G formula based on SCr of 0.7 mg/dL).   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   Panic attacks    Personal history of chemotherapy    Personal history of radiation therapy    Sleep apnea     Medications:  No anticoagulation prior to admission per my chart review. Medication reconciliation is pending  Assessment: 53 y/o F with medical history as above presenting with acute PE. Pharmacy consulted to dose heparin .  Baseline aPTT and INR are pending. CBC is normal  Goal of Therapy:  Heparin  level 0.3-0.7 units/ml Monitor platelets by anticoagulation protocol: Yes   Plan:  --Heparin  6000 unit IV bolus followed by continuous infusion at 1800 units/hr --Heparin  level 6 hours from start --Daily CBC per protocol  Sherry Price Mare 12/17/2023,6:45 PM

## 2023-12-17 NOTE — ED Provider Notes (Signed)
 [facility] Provider Note    Event Date/Time   First MD Initiated Contact with Patient 12/17/23 1603     (approximate)   History   Chief Complaint Chest Pain   HPI  Sherry Price is a 53 y.o. female with past medical history of hypertension, hyperlipidemia, diabetes, breast cancer, and chronic pain syndrome who presents to the ED complaining of chest pain.  Patient reports that she has had 1 week of pain in the left side of her chest that seems to radiate upwards from her left arm.  She denies any falls or injuries to her chest, has had some mild difficulty breathing with exertion but denies any fever or cough.  She does report some pain in both of her legs, deals with chronic pain in both knees due to arthritis, but states this is worse.  She reports a mild amount of swelling in both legs but has not noticed any redness or skin lesions.  She previously had a left sided mastectomy, denies any history of DVT/PE.     Physical Exam   Triage Vital Signs: ED Triage Vitals  Encounter Vitals Group     BP 12/17/23 1244 (!) 124/104     Girls Systolic BP Percentile --      Girls Diastolic BP Percentile --      Boys Systolic BP Percentile --      Boys Diastolic BP Percentile --      Pulse Rate 12/17/23 1244 100     Resp 12/17/23 1244 18     Temp 12/17/23 1244 98.7 F (37.1 C)     Temp src --      SpO2 12/17/23 1244 100 %     Weight 12/17/23 1242 (!) 307 lb (139.3 kg)     Height 12/17/23 1242 5' 7 (1.702 m)     Head Circumference --      Peak Flow --      Pain Score 12/17/23 1242 9     Pain Loc --      Pain Education --      Exclude from Growth Chart --     Most recent vital signs: Vitals:   12/17/23 1244 12/17/23 1831  BP: (!) 124/104 (!) 150/91  Pulse: 100 (!) 101  Resp: 18 (!) 22  Temp: 98.7 F (37.1 C)   SpO2: 100% 100%    Constitutional: Alert and oriented. Eyes: Conjunctivae are normal. Head: Atraumatic. Nose: No  congestion/rhinnorhea. Mouth/Throat: Mucous membranes are moist.  Cardiovascular: Normal rate, regular rhythm. Grossly normal heart sounds.  2+ radial and DP pulses bilaterally. Respiratory: Normal respiratory effort.  No retractions. Lungs CTAB.  No chest wall tenderness to palpation. Gastrointestinal: Soft and nontender. No distention. Musculoskeletal: Pain upon range of motion of bilateral knees with no erythema, edema, or warmth. Neurologic:  Normal speech and language. No gross focal neurologic deficits are appreciated.    ED Results / Procedures / Treatments   Labs (all labs ordered are listed, but only abnormal results are displayed) Labs Reviewed  BASIC METABOLIC PANEL WITH GFR - Abnormal; Notable for the following components:      Result Value   Glucose, Bld 139 (*)    All other components within normal limits  D-DIMER, QUANTITATIVE - Abnormal; Notable for the following components:   D-Dimer, Quant 0.91 (*)    All other components within normal limits  CBC  APTT  PROTIME-INR  CBC  HEPARIN  LEVEL (UNFRACTIONATED)  BRAIN NATRIURETIC PEPTIDE  CBG  MONITORING, ED  TROPONIN I (HIGH SENSITIVITY)     EKG  ED ECG REPORT I, Carlin Palin, the attending physician, personally viewed and interpreted this ECG.   Date: 12/17/2023  EKG Time: 12:43  Rate: 100  Rhythm: normal sinus rhythm  Axis: Normal  Intervals:none  ST&T Change: None  RADIOLOGY Chest x-ray reviewed and interpreted by me with no infiltrate, edema, or effusion.  PROCEDURES:  Critical Care performed: No  Procedures   MEDICATIONS ORDERED IN ED: Medications  heparin  bolus via infusion 6,000 Units (has no administration in time range)    Followed by  heparin  ADULT infusion 100 units/mL (25000 units/250mL) (has no administration in time range)  iohexol  (OMNIPAQUE ) 350 MG/ML injection 100 mL (100 mLs Intravenous Contrast Given 12/17/23 1815)     IMPRESSION / MDM / ASSESSMENT AND PLAN / ED COURSE  I  reviewed the triage vital signs and the nursing notes.                              53 y.o. female with past medical history of hypertension, hyperlipidemia, diabetes, breast cancer, and chronic pain syndrome who presents to the ED with 1 week of pain radiating up her left arm and into her chest with pain in both of her legs.  Patient's presentation is most consistent with acute presentation with potential threat to life or bodily function.  Differential diagnosis includes, but is not limited to, ACS, PE, pneumonia, pneumothorax, musculoskeletal pain, GERD, anxiety, DVT, arthritis.  Patient nontoxic-appearing and in no acute distress, vital signs are unremarkable.  EKG shows no evidence of arrhythmia or ischemia and symptoms seem atypical for ACS, troponin within normal limits.  Chest x-ray is unremarkable and additional labs are reassuring with no significant anemia, leukocytosis, electrolyte abnormality, or AKI.  We will check D-dimer, but lower extremity symptoms seem more likely to be related to acute on chronic arthritis of her knees.  No signs concerning for septic arthritis and she is neurovascular intact distally to her lower extremities.  Patient declines pain medication, will reassess following additional results.  D-dimer noted to be elevated, CTA chest is positive for small volume PE with no evidence of right heart strain.  We will start patient on IV heparin , case discussed with hospitalist for admission.      FINAL CLINICAL IMPRESSION(S) / ED DIAGNOSES   Final diagnoses:  Acute pulmonary embolism without acute cor pulmonale, unspecified pulmonary embolism type (HCC)     Rx / DC Orders   ED Discharge Orders     None        Note:  This document was prepared using Dragon voice recognition software and may include unintentional dictation errors.   Palin Carlin, MD 12/17/23 [phone]

## 2023-12-17 NOTE — H&P (Signed)
 History and Physical    Sherry Price FMW:[id number] DOB: 1970-08-31 DOA: 12/17/2023  PCP: [name], NP  Patient coming from: home  I have personally briefly reviewed patient's old medical records in [facility]  Chief Complaint: left sided chest pain with sob   HPI: Sherry Price is a 53 y.o. female with medical history significant of Hypertension, Left BRCA s/p chemo /xrt, DmII , OSA, obesity who presents to ED with 2 days of chest pain with radiation down her arm and associated with nausea and sob. Patient notes no fever/ chills but overall generalize malaise.   ED Course:  Vitals:afeb, bp:124/ 104, hr 100 rr 18 sat 100%  Wbc : 7.9, hgb 13.1, plt 362,  Na 138, K 3.7, Cl 102, glu 139, cr 0.7  Ce 3, bnp 22.2 -didmer 0.91 Cxr NAD CTPE IMPRESSION: 1. Small burden, nonobstructive segmental and subsegmental pulmonary emboli in the right lower lobe. No findings of right heart strain. 2. No pneumonia, pulmonary edema, or pleural effusion. Review of Systems: As per HPI otherwise 10 point review of systems negative.   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   Panic attacks    Personal history of chemotherapy    Personal history of radiation therapy    Sleep apnea     Past Surgical History:  Procedure Laterality Date   BREAST BIOPSY Left 2013   +   BREAST BIOPSY Right    neg   BREAST LUMPECTOMY  2013   BREAST MAMMOSITE Left February 2013   BREAST REDUCTION SURGERY  2005   COLONOSCOPY WITH PROPOFOL  N/A 01/09/2015   Procedure: COLONOSCOPY WITH PROPOFOL ;  Surgeon: Rogelia Copping, MD;  Location: [facility] ENDOSCOPY;  Service: Endoscopy;  Laterality:  N/A;   ESOPHAGOGASTRODUODENOSCOPY (EGD) WITH PROPOFOL  N/A 01/09/2015   Procedure: ESOPHAGOGASTRODUODENOSCOPY (EGD) WITH PROPOFOL ;  Surgeon: Rogelia Copping, MD;  Location: [facility] ENDOSCOPY;  Service: Endoscopy;  Laterality: N/A;   OOPHORECTOMY Right 2013   REDUCTION MAMMAPLASTY       reports that she has quit smoking. Her smoking use included cigarettes. She has never used smokeless tobacco. She reports current alcohol use. She reports that she does not use drugs.  Allergies  Allergen Reactions   Lisinopril     Other Reaction(s): cough (and knee pain per patient)   Losartan Hives    Family History  Problem Relation Age of Onset   Depression Mother    Hypertension Mother    Heart disease Mother    Diabetes Mother    Alcohol abuse Father    Breast cancer Cousin     Prior to Admission medications   Medication Sig Start Date End Date Taking? Authorizing Provider  ALPRAZolam  (XANAX ) 0.5 MG tablet TAKE 1 TABLET(0.5 MG) BY MOUTH DAILY AS NEEDED FOR ANXIETY 11/19/23   [name], NP  amLODipine  (NORVASC ) 10 MG tablet TAKE 1 TABLET(10 MG) BY MOUTH DAILY 06/05/23   [name], NP  Aspirin -Acetaminophen -Caffeine  (GOODY HEADACHE PO) Take 1 packet by mouth as needed.    [provider]  atorvastatin  (LIPITOR) 40 MG tablet Take 40 mg by mouth daily.    [provider]  Blood Glucose Monitoring Suppl DEVI 1 each by Does not apply route in the morning, at noon, and at bedtime. May substitute to any manufacturer covered by patient's insurance. 09/25/23   [name], FNP  buprenorphine  (BUTRANS ) 7.5 MCG/HR Place 1 patch onto the skin once a week. 12/07/23   Urbano Albright, MD  calcium  carbonate (TUMS) 500 MG chewable tablet Chew 1 tablet (200 mg of elemental calcium  total) by mouth 2 (two) times daily. 09/16/16   [name], MD  hydrochlorothiazide  (HYDRODIURIL ) 25 MG tablet TAKE 1/2 TABLET(12.5 MG) BY MOUTH DAILY 05/08/23   [name], NP  labetalol  (NORMODYNE ) 200 MG  tablet TAKE 1 TABLET(200 MG) BY MOUTH TWICE DAILY 06/05/23   [name], NP  mupirocin  ointment (BACTROBAN ) 2 % Apply 1 Application topically 2 (two) times daily. 06/25/23   [name], NP  ondansetron  (ZOFRAN -ODT) 4 MG disintegrating tablet DISSOLVE 1 TO 2 TABLETS ON THE TONGUE EVERY 8 HOURS AS NEEDED FOR NAUSEA OR VOMITING 09/22/23   [name], FNP  oxyCODONE -acetaminophen  (PERCOCET) 5-325 MG tablet Take 1 tablet by mouth every 8 (eight) hours as needed for severe pain (pain score 7-10). 12/07/23   Urbano Albright, MD  prazosin  (MINIPRESS ) 1 MG capsule Take 1 capsule (1 mg total) by mouth at bedtime. 12/10/23 02/08/24  [name], MD  Suzetrigine  50 MG TABS Take 50 mg by mouth every 12 (twelve) hours as needed. Take two 50mg  capsules for your first dose at the start of a pain episode on an empty stomach, followed by 50mg  (1 capsule)  with or without food every 12 hours until pain episode is improved 11/03/23   Urbano Albright, MD  tirzepatide  (MOUNJARO ) 15 MG/0.5ML Pen Inject 15 mg into the skin once a week. 10/13/23   [name], NP  venlafaxine  XR (EFFEXOR -XR) 150 MG 24 hr capsule Take 1 capsule (150 mg total) by mouth daily with breakfast. 05/21/23   [name], NP  venlafaxine  XR (EFFEXOR -XR) 75 MG 24 hr capsule Take 1 capsule (75 mg total) by mouth daily with breakfast. Take total of 225 mg daily, take along with 150 mg cap 01/04/24 03/04/24  [name], MD    Physical Exam: Vitals:   12/17/23 1242 12/17/23 1244 12/17/23 1831  BP:  (!) 124/104 (!) 150/91  Pulse:  100 (!) 101  Resp:  18 (!) 22  Temp:  98.7 F (37.1 C)   SpO2:  100% 100%  Weight: (!) 139.3 kg    Height: 5' 7 (1.702 m)      Constitutional: NAD, calm, comfortable Vitals:   12/17/23 1242 12/17/23 1244 12/17/23 1831  BP:  (!) 124/104 (!) 150/91  Pulse:  100 (!) 101  Resp:  18 (!) 22  Temp:  98.7 F (37.1 C)   SpO2:  100% 100%  Weight: (!) 139.3 kg    Height: 5' 7 (1.702 m)     Eyes: PERRL, lids and  conjunctivae normal ENMT: Mucous membranes are moist. Posterior pharynx clear of any exudate or lesions.Normal dentition.  Neck: normal, supple, no masses, no thyromegaly Respiratory: clear to auscultation bilaterally, no wheezing, no crackles. Normal respiratory effort. No accessory muscle use.  Cardiovascular: Regular rate and rhythm, no murmurs / rubs / gallops. No extremity edema. 2+ pedal pulses Abdomen: obese,no tenderness, no masses palpated. No hepatosplenomegaly. Bowel sounds positive.  Musculoskeletal: no clubbing / cyanosis. No joint deformity upper and lower extremities. Good ROM, no contractures. Normal muscle tone.  Skin: no rashes, lesions, ulcers. No induration Neurologic:  CN grossly intact. Sensation intact, Strength 5/5 in all 4.  Psychiatric: Normal judgment and insight. Alert and oriented x 3. Normal mood.    Labs on Admission: I have personally reviewed following labs and imaging studies  CBC: Recent Labs  Lab 12/17/23 1248  WBC 7.9  HGB 13.1  HCT 39.0  MCV 95.8  PLT 363   Basic Metabolic Panel: Recent Labs  Lab 12/17/23 1248  NA 138  K 3.7  CL 102  CO2 24  GLUCOSE 139*  BUN 8  CREATININE 0.70  CALCIUM  9.3   GFR: Estimated Creatinine Clearance: 116.3 mL/min (by C-G formula based on SCr of 0.7 mg/dL). Liver Function Tests: No results for input(s): AST, ALT, ALKPHOS, BILITOT, PROT, ALBUMIN in the last 168 hours. No results for input(s): LIPASE, AMYLASE in the last 168 hours. No results for input(s): AMMONIA in the last 168 hours. Coagulation Profile: Recent Labs  Lab 12/17/23 1926  INR 1.0   Cardiac Enzymes: No results for input(s): CKTOTAL, CKMB, CKMBINDEX, TROPONINI in the last 168 hours. BNP (last 3 results) No results for input(s): PROBNP in the last 8760 hours. HbA1C: No results for input(s): HGBA1C in the last 72 hours. CBG: Recent Labs  Lab 12/17/23 1852  GLUCAP 82   Lipid Profile: No results for  input(s): CHOL, HDL, LDLCALC, TRIG, CHOLHDL, LDLDIRECT in the last 72 hours. Thyroid  Function Tests: No results for input(s): TSH, T4TOTAL, FREET4, T3FREE, THYROIDAB in the last 72 hours. Anemia Panel: No results for input(s): VITAMINB12, FOLATE, FERRITIN, TIBC, IRON, RETICCTPCT in the last 72 hours. Urine analysis:    Component Value Date/Time   COLORURINE YELLOW 10/13/2023 0859   APPEARANCEUR Cloudy (A) 10/13/2023 0859   APPEARANCEUR Hazy 08/03/2014 2102   LABSPEC 1.020 10/13/2023 0859   LABSPEC 1.009 08/03/2014 2102   PHURINE 7.5 10/13/2023 0859   GLUCOSEU NEGATIVE 10/13/2023 0859   HGBUR SMALL (A) 10/13/2023 0859   BILIRUBINUR NEGATIVE 10/13/2023 0859   BILIRUBINUR negative 09/22/2023 0857   BILIRUBINUR Negative 08/03/2014 2102   KETONESUR NEGATIVE 10/13/2023 0859   PROTEINUR Negative 09/22/2023 0857   PROTEINUR NEGATIVE 03/19/2023 1954   UROBILINOGEN 0.2 10/13/2023 0859   NITRITE POSITIVE (A) 10/13/2023 0859   LEUKOCYTESUR MODERATE (A) 10/13/2023 0859   LEUKOCYTESUR Negative 08/03/2014 2102    Radiological Exams on Admission: US  Venous Img Lower Bilateral Result Date: 12/17/2023 CLINICAL DATA:  Pain and swelling, +dimer EXAM: BILATERAL LOWER EXTREMITY VENOUS DOPPLER ULTRASOUND TECHNIQUE: Gray-scale sonography with graded compression, as well as color Doppler and duplex ultrasound were performed to evaluate the lower extremity deep venous systems from the level of the common femoral vein and including the common femoral, femoral, profunda femoral, popliteal and calf veins including the posterior tibial, peroneal and gastrocnemius veins when visible. The superficial great saphenous vein was also interrogated. Spectral Doppler was utilized to evaluate flow at rest and with distal augmentation maneuvers in the common femoral, femoral and popliteal veins. COMPARISON:  None Available. FINDINGS: RIGHT LOWER EXTREMITY Common Femoral Vein: No evidence of  thrombus. Normal compressibility, respiratory phasicity and response to augmentation. Saphenofemoral Junction: No evidence of thrombus. Normal compressibility and flow on color Doppler imaging. Profunda Femoral Vein: No evidence of thrombus. Normal compressibility and flow on color Doppler imaging. Femoral Vein: No evidence of thrombus. Normal compressibility, respiratory phasicity and response to augmentation. Popliteal Vein: No evidence of thrombus. Normal compressibility, respiratory phasicity and response to augmentation. Calf Veins: No evidence of thrombus. Normal compressibility and flow on color Doppler imaging. Superficial Great Saphenous  Vein: No evidence of thrombus. Normal compressibility. Other Findings:  None. LEFT LOWER EXTREMITY Common Femoral Vein: No evidence of thrombus. Normal compressibility, respiratory phasicity and response to augmentation. Saphenofemoral Junction: No evidence of thrombus. Normal compressibility and flow on color Doppler imaging. Profunda Femoral Vein: No evidence of thrombus. Normal compressibility and flow on color Doppler imaging. Femoral Vein: No evidence of thrombus. Normal compressibility, respiratory phasicity and response to augmentation. Popliteal Vein: No evidence of thrombus. Normal compressibility, respiratory phasicity and response to augmentation. Calf Veins: No evidence of thrombus. Normal compressibility and flow on color Doppler imaging. Superficial Great Saphenous Vein: No evidence of thrombus. Normal compressibility. Other Findings:  None. IMPRESSION: Negative for deep venous thrombosis within both legs. Electronically Signed   By: Rogelia Myers M.D.   On: 12/17/2023 18:41   CT Angio Chest PE W/Cm &/Or Wo Cm Result Date: 12/17/2023 CLINICAL DATA:  Pulmonary embolism (PE) suspected, low to intermediate prob, positive D-dimer, left-sided chest pain with nausea and dyspnea EXAM: CT ANGIOGRAPHY CHEST WITH CONTRAST TECHNIQUE: Multidetector CT imaging of the  chest was performed using the standard protocol during bolus administration of intravenous contrast. Multiplanar CT image reconstructions and MIPs were obtained to evaluate the vascular anatomy. RADIATION DOSE REDUCTION: This exam was performed according to the departmental dose-optimization program which includes automated exposure control, adjustment of the mA and/or kV according to patient size and/or use of iterative reconstruction technique. CONTRAST:  OMNIPAQUE  IOHEXOL  350 MG/ML SOLN COMPARISON:  December 17, 2023 FINDINGS: Pulmonary Embolism: Small burden, nonobstructive segmental and subsegmental pulmonary emboli in the right lower lobe. A nonocclusive proximal segmental embolus may also be present in the anterior right upper lobe. No findings of right heart strain. Cardiovascular: No cardiomegaly or pericardial effusion.No aortic aneurysm. Mediastinum/Nodes: No mediastinal mass. No mediastinal, hilar, or axillary lymphadenopathy. Lungs/Pleura: The midline trachea and bronchi are patent. No focal airspace consolidation, pleural effusion, or pneumothorax. Musculoskeletal: No acute fracture or destructive bone lesion. Multilevel degenerative disc disease of the spine. Upper Abdomen: No acute abnormality in the partially visualized upper abdomen. Review of the MIP images confirms the above findings. IMPRESSION: 1. Small burden, nonobstructive segmental and subsegmental pulmonary emboli in the right lower lobe. No findings of right heart strain. 2. No pneumonia, pulmonary edema, or pleural effusion. Critical Value/emergent results were called by telephone at the time of interpretation on 12/17/2023 at 6:40 pm to provider [facility] , who verbally acknowledged these results. Electronically Signed   By: Rogelia Myers M.D.   On: 12/17/2023 18:41   DG Chest 2 View Result Date: 12/17/2023 EXAM: 2 VIEW(S) XRAY OF THE CHEST 12/17/2023 12:58:47 PM COMPARISON: 09/12/2020 CLINICAL HISTORY: Chest pain. C/O left  sided chest pain radiates down left arm x 2 days with nausea and SOB. Hx of left sided breast cancer. FINDINGS: LUNGS AND PLEURA: No focal pulmonary opacity. No pulmonary edema. No pleural effusion. No pneumothorax. HEART AND MEDIASTINUM: No acute abnormality of the cardiac and mediastinal silhouettes. BONES AND SOFT TISSUES: No acute osseous abnormality. LIMITATIONS/ARTIFACTS: Lateral view degraded by patient arm position, not raised above the head. Apical lordotic frontal positioning. IMPRESSION: 1. No acute process. Electronically signed by: Rockey Kilts MD 12/17/2023 01:06 PM EDT RP Workstation: [id number]    EKG: Independently reviewed. See above  Assessment/Plan   Acute Pulmonary Emboli  -admit to cardiac tele  - continue heparin  drip  - echo in am to evaluate for strain  - transition to oral anticoagulation in am -supportive care  -note on associated hypoxemia  Left BRCA  -s/p chemo /xrt   OSA - continue with cpap at bedtime    DMII -iss/fs -resume home regimen once med rec completed      Hypertension -stable  -resume home regimen once med rec completed   Obesity ClassIII  DVT prophylaxis: heparin  drip Code Status: full/ as discussed per patient wishes in event of cardiac arrest  Family Communication: daughter at bedside Disposition Plan: patient  expected to be admitted less than 2 midnights  Consults called: none Admission status: cardiac tele   Camila DELENA Ned MD [facility]   If 7PM-7AM, please contact night-coverage [email or web address] Password [facility]  12/17/2023, 7:50 PM

## 2023-12-18 ENCOUNTER — Inpatient Hospital Stay (HOSPITAL_COMMUNITY)
Admit: 2023-12-18 | Discharge: 2023-12-18 | Disposition: A | Discharge status: Home / Self Care | Attending: Internal Medicine | Admitting: Internal Medicine

## 2023-12-18 ENCOUNTER — Telehealth (HOSPITAL_COMMUNITY): Payer: Self-pay | Admitting: Pharmacy Technician

## 2023-12-18 ENCOUNTER — Other Ambulatory Visit (HOSPITAL_COMMUNITY): Payer: Self-pay

## 2023-12-18 DIAGNOSIS — E66813 Obesity, class 3: Secondary | ICD-10-CM

## 2023-12-18 DIAGNOSIS — I2699 Other pulmonary embolism without acute cor pulmonale: Secondary | ICD-10-CM | POA: Diagnosis not present

## 2023-12-18 DIAGNOSIS — Z170 Estrogen receptor positive status [ER+]: Secondary | ICD-10-CM

## 2023-12-18 DIAGNOSIS — F32A Depression, unspecified: Secondary | ICD-10-CM

## 2023-12-18 DIAGNOSIS — M1990 Unspecified osteoarthritis, unspecified site: Secondary | ICD-10-CM

## 2023-12-18 DIAGNOSIS — E785 Hyperlipidemia, unspecified: Secondary | ICD-10-CM

## 2023-12-18 DIAGNOSIS — I10 Essential (primary) hypertension: Secondary | ICD-10-CM

## 2023-12-18 DIAGNOSIS — F419 Anxiety disorder, unspecified: Secondary | ICD-10-CM

## 2023-12-18 DIAGNOSIS — E876 Hypokalemia: Secondary | ICD-10-CM

## 2023-12-18 DIAGNOSIS — C50419 Malignant neoplasm of upper-outer quadrant of unspecified female breast: Secondary | ICD-10-CM

## 2023-12-18 DIAGNOSIS — E119 Type 2 diabetes mellitus without complications: Secondary | ICD-10-CM

## 2023-12-18 DIAGNOSIS — R9431 Abnormal electrocardiogram [ECG] [EKG]: Secondary | ICD-10-CM | POA: Diagnosis not present

## 2023-12-18 LAB — CBG MONITORING, ED
Glucose-Capillary: 114 mg/dL — ABNORMAL HIGH (ref 70–99)
Glucose-Capillary: 127 mg/dL — ABNORMAL HIGH (ref 70–99)

## 2023-12-18 LAB — ECHOCARDIOGRAM COMPLETE
Area-P 1/2: 6.17 cm2
Height: 67 in
S' Lateral: 2.8 cm
Weight: 4912 [oz_av]

## 2023-12-18 LAB — COMPREHENSIVE METABOLIC PANEL WITH GFR
ALT: 16 U/L (ref 0–44)
AST: 22 U/L (ref 15–41)
Albumin: 3.4 g/dL — ABNORMAL LOW (ref 3.5–5.0)
Alkaline Phosphatase: 54 U/L (ref 38–126)
Anion gap: 11 (ref 5–15)
BUN: 11 mg/dL (ref 6–20)
CO2: 23 mmol/L (ref 22–32)
Calcium: 8.7 mg/dL — ABNORMAL LOW (ref 8.9–10.3)
Chloride: 104 mmol/L (ref 98–111)
Creatinine, Ser: 0.63 mg/dL (ref 0.44–1.00)
GFR, Estimated: >60 mL/min (ref >60)
Glucose, Bld: 100 mg/dL — ABNORMAL HIGH (ref 70–99)
Potassium: 3.2 mmol/L — ABNORMAL LOW (ref 3.5–5.1)
Sodium: 138 mmol/L (ref 135–145)
Total Bilirubin: 0.9 mg/dL (ref 0.0–1.2)
Total Protein: 6.6 g/dL (ref 6.5–8.1)

## 2023-12-18 LAB — RESPIRATORY PANEL BY PCR

## 2023-12-18 LAB — GLUCOSE, CAPILLARY: Glucose-Capillary: 96 mg/dL (ref 70–99)

## 2023-12-18 LAB — CBC
HCT: 36.3 % (ref 36.0–46.0)
Hemoglobin: 12.1 g/dL (ref 12.0–15.0)
MCH: 31.8 pg (ref 26.0–34.0)
MCHC: 33.3 g/dL (ref 30.0–36.0)
MCV: 95.5 fL (ref 80.0–100.0)
Platelets: 347 K/uL (ref 150–400)
RBC: 3.8 MIL/uL — ABNORMAL LOW (ref 3.87–5.11)
RDW: 13.9 % (ref 11.5–15.5)
WBC: 11.2 K/uL — ABNORMAL HIGH (ref 4.0–10.5)
nRBC: 0 % (ref 0.0–0.2)

## 2023-12-18 LAB — HEPARIN LEVEL (UNFRACTIONATED): Heparin Unfractionated: 0.22 [IU]/mL — ABNORMAL LOW (ref 0.30–0.70)

## 2023-12-18 LAB — MAGNESIUM: Magnesium: 2.2 mg/dL (ref 1.7–2.4)

## 2023-12-18 MED ORDER — VENLAFAXINE HCL ER 75 MG PO CP24
150.0000 mg | ORAL_CAPSULE | Freq: Every day | ORAL | Status: DC
  Administered 2023-12-18 – 2023-12-23 (×6): 150 mg via ORAL
  Filled 2023-12-18: qty 2
  Filled 2023-12-18: qty 1
  Filled 2023-12-18 (×4): qty 2

## 2023-12-18 MED ORDER — PERFLUTREN LIPID MICROSPHERE
1.0000 mL | INTRAVENOUS | Status: AC | PRN
  Administered 2023-12-18: 3 mL via INTRAVENOUS

## 2023-12-18 MED ORDER — HEPARIN BOLUS VIA INFUSION
2100.0000 [IU] | Freq: Once | INTRAVENOUS | Status: AC
  Administered 2023-12-18: 2100 [IU] via INTRAVENOUS
  Filled 2023-12-18: qty 2100

## 2023-12-18 MED ORDER — LABETALOL HCL 200 MG PO TABS
200.0000 mg | ORAL_TABLET | Freq: Two times a day (BID) | ORAL | Status: DC
  Administered 2023-12-18 – 2023-12-23 (×11): 200 mg via ORAL
  Filled 2023-12-18 (×11): qty 1

## 2023-12-18 MED ORDER — ATORVASTATIN CALCIUM 20 MG PO TABS
40.0000 mg | ORAL_TABLET | Freq: Every day | ORAL | Status: DC
  Administered 2023-12-18 – 2023-12-23 (×6): 40 mg via ORAL
  Filled 2023-12-18 (×6): qty 2

## 2023-12-18 MED ORDER — MELATONIN 5 MG PO TABS
5.0000 mg | ORAL_TABLET | Freq: Every day | ORAL | Status: DC
  Administered 2023-12-18 – 2023-12-22 (×5): 5 mg via ORAL
  Filled 2023-12-18 (×5): qty 1

## 2023-12-18 MED ORDER — RIVAROXABAN 20 MG PO TABS: 20.0000 mg | ORAL_TABLET | Freq: Every day | ORAL | Status: DC

## 2023-12-18 MED ORDER — CALCIUM CARBONATE ANTACID 500 MG PO CHEW
1.0000 | CHEWABLE_TABLET | Freq: Two times a day (BID) | ORAL | Status: DC
  Administered 2023-12-18 – 2023-12-23 (×11): 200 mg via ORAL
  Filled 2023-12-18 (×11): qty 1

## 2023-12-18 MED ORDER — AMLODIPINE BESYLATE 10 MG PO TABS
10.0000 mg | ORAL_TABLET | Freq: Every day | ORAL | Status: DC
Start: 2023-12-18 — End: 2023-12-23
  Administered 2023-12-18 – 2023-12-23 (×6): 10 mg via ORAL
  Filled 2023-12-18 (×4): qty 1
  Filled 2023-12-18: qty 2
  Filled 2023-12-18: qty 1

## 2023-12-18 MED ORDER — HYDROCHLOROTHIAZIDE 25 MG PO TABS
25.0000 mg | ORAL_TABLET | Freq: Every day | ORAL | Status: DC
  Administered 2023-12-18 – 2023-12-23 (×6): 25 mg via ORAL
  Filled 2023-12-18 (×6): qty 1

## 2023-12-18 MED ORDER — RIVAROXABAN 15 MG PO TABS
15.0000 mg | ORAL_TABLET | Freq: Two times a day (BID) | ORAL | Status: DC
  Administered 2023-12-18 – 2023-12-23 (×11): 15 mg via ORAL
  Filled 2023-12-18 (×13): qty 1

## 2023-12-18 MED ORDER — HEPARIN (PORCINE) 25000 UT/250ML-% IV SOLN
2000.0000 [IU]/h | INTRAVENOUS | Status: DC
  Administered 2023-12-18: 2000 [IU]/h via INTRAVENOUS
  Filled 2023-12-18: qty 250

## 2023-12-18 MED ORDER — POTASSIUM CHLORIDE CRYS ER 20 MEQ PO TBCR
40.0000 meq | EXTENDED_RELEASE_TABLET | Freq: Once | ORAL | Status: AC
  Administered 2023-12-18: 40 meq via ORAL
  Filled 2023-12-18: qty 2

## 2023-12-18 MED ORDER — INSULIN ASPART 100 UNIT/ML IJ SOLN: 0.0000 [IU] | Freq: Three times a day (TID) | INTRAMUSCULAR | Status: DC

## 2023-12-18 MED ORDER — ALPRAZOLAM 0.5 MG PO TABS
0.5000 mg | ORAL_TABLET | Freq: Every day | ORAL | Status: DC | PRN
  Administered 2023-12-19: 0.5 mg via ORAL
  Filled 2023-12-18: qty 1

## 2023-12-18 MED ORDER — BUPRENORPHINE 7.5 MCG/HR TD PTWK: 1.0000 | MEDICATED_PATCH | TRANSDERMAL | Status: DC

## 2023-12-18 MED ORDER — HEPARIN BOLUS VIA INFUSION
2000.0000 [IU] | Freq: Once | INTRAVENOUS | Status: DC
  Filled 2023-12-18: qty 2000

## 2023-12-18 NOTE — Assessment & Plan Note (Signed)
 S/p chemo and radiation -Outpatient follow-up

## 2023-12-18 NOTE — Assessment & Plan Note (Signed)
 Patient has a small PE, lower extremity venous Doppler was negative for DVT.  PESI score of 85 which makes her low risk and she remained on room air. Echocardiogram was normal - Switching heparin  infusion with Xarelto 

## 2023-12-18 NOTE — Assessment & Plan Note (Signed)
 Patient was just taking Mounjaro  with most recent A1c of 5.8. CBG within goal -SSI for now

## 2023-12-18 NOTE — Assessment & Plan Note (Signed)
-   Continue home Effexor  and as needed Xanax

## 2023-12-18 NOTE — Evaluation (Signed)
 Physical Therapy Evaluation Patient Details Name: Sherry Price MRN: [id number] DOB: March 22, 1971 Today's Date: 12/18/2023  History of Present Illness  53yoF who [facility] after acute onset LUE and left CP, found to have acute PE. Pt is a household AMB at baseline, 4WW use, has really struggled with mobility over the years due to chronic bilat knee DJD.  Clinical Impression  Pt still having some pain in LUE, SOB with exertion, only tolerating ~60sec of standing being severely limited by pain in knees, progressive tremor, and progressive DOE. Pt is concerned about weakness in her bilat legs that remains acutely weaker than her recent baseline. Recommend extensive DME at DC to achieve mobility needs at DC, given novelty of DC location to new home. If STR services are available at SNF, this would be well poised to prepare pt for a safer transition to home thereafter. Patient will benefit from skilled physical therapy intervention to reduce deficits and impairments identified in evaluation, in order to reduce pain, improve quality of life, and maximize activity tolerance for ADL, IADL, and leisure/fitness. Physical therapy will help pt achieve long and short term goals of care.        If plan is discharge home, recommend the following: A lot of help with walking and/or transfers;A lot of help with bathing/dressing/bathroom   Can travel by private vehicle   No    Equipment Recommendations Rolling walker (2 wheels);BSC/3in1;Wheelchair (measurements PT);Wheelchair cushion (measurements PT)  Recommendations for Other Services       Functional Status Assessment Patient has had a recent decline in their functional status and demonstrates the ability to make significant improvements in function in a reasonable and predictable amount of time.     Precautions / Restrictions Precautions Precautions: Fall Restrictions Weight Bearing Restrictions Per Provider Order: No      Mobility  Bed  Mobility Overal bed mobility: Modified Independent             General bed mobility comments: to left EOB    Transfers Overall transfer level: Needs assistance Equipment used: Rolling walker (2 wheels) Transfers: Sit to/from Stand Sit to Stand: From elevated surface           General transfer comment: very elevated and prefers her baseline technique of a trunk twist BUE push off locked KAFO style trasnfer, but return to sitting in the typical fashion.    Ambulation/Gait Ambulation/Gait assistance:  (not tolerant of >60sec at a time.)           Pre-gait activities: marching in place with RW x10; on 2nd time up performs side stepping along EOB to center self with bed.    Stairs            Wheelchair Mobility     Tilt Bed    Modified Rankin (Stroke Patients Only)       Balance                                             Pertinent Vitals/Pain      Home Living Family/patient expects to be discharged to:: Private residence Living Arrangements: Children (would plan to DC to her DTRs home in [place]) Available Help at Discharge: Family (DTR, DTR's spouse, and young child) Type of Home: House (2 level home) Home Access: Stairs to enter       Home Layout: Two level Home  Equipment: Agricultural consultant (2 wheels);Rollator (4 wheels)      Prior Function                       Extremity/Trunk Assessment                Communication        Cognition                                         Cueing       General Comments      Exercises General Exercises - Lower Extremity Heel Slides: AAROM, Both, 10 reps, Supine   Assessment/Plan    PT Assessment Patient needs continued PT services  PT Problem List Decreased strength;Decreased range of motion;Decreased activity tolerance;Decreased balance;Decreased mobility;Decreased coordination;Decreased knowledge of use of DME;Decreased safety awareness;Decreased  knowledge of precautions       PT Treatment Interventions DME instruction;Gait training;Stair training;Functional mobility training;Therapeutic activities;Therapeutic exercise;Balance training;Neuromuscular re-education;Patient/family education    PT Goals (Current goals can be found in the Care Plan section)  Acute Rehab PT Goals Patient Stated Goal: regain strength, improve ability to return to safe householdAMB PT Goal Formulation: With patient Time For Goal Achievement: 01/01/24 Potential to Achieve Goals: Good    Frequency Min 2X/week     Co-evaluation               AM-PAC PT 6 Clicks Mobility  Outcome Measure Help needed turning from your back to your side while in a flat bed without using bedrails?: None Help needed moving from lying on your back to sitting on the side of a flat bed without using bedrails?: None Help needed moving to and from a bed to a chair (including a wheelchair)?: A Lot Help needed standing up from a chair using your arms (e.g., wheelchair or bedside chair)?: A Lot Help needed to walk in hospital room?: Total Help needed climbing 3-5 steps with a railing? : Total 6 Click Score: 14    End of Session   Activity Tolerance: Patient limited by fatigue;Patient limited by lethargy Patient left: in bed;with call bell/phone within reach;with family/visitor present Nurse Communication:  (meds check) PT Visit Diagnosis: Difficulty in walking, not elsewhere classified (R26.2);Other abnormalities of gait and mobility (R26.89);Muscle weakness (generalized) (M62.81);Dizziness and giddiness (R42)    Time: 8886-8854 PT Time Calculation (min) (ACUTE ONLY): 32 min   Charges:   PT Evaluation $PT Eval High Complexity: 1 High PT Treatments $Therapeutic Activity: 8-22 mins PT General Charges $$ ACUTE PT VISIT: 1 Visit    12:10 PM, 12/18/23 Peggye JAYSON Linear, PT, DPT Physical Therapist - [facility]  [phone] (ASCOM)     Prisca Gearing C 12/18/2023, 12:05 PM

## 2023-12-18 NOTE — Consult Note (Signed)
 PHARMACY - ANTICOAGULATION CONSULT NOTE  Pharmacy Consult for IV Heparin  Indication: pulmonary embolus  Patient Measurements: Height: 5' 7 (170.2 cm) Weight: (!) 139.3 kg (307 lb) IBW/kg (Calculated) : 61.6 HEPARIN  DW (KG): 95.7  Labs: Recent Labs    12/17/23 1248 12/17/23 1926 12/18/23 0334  HGB 13.1  --  12.1  HCT 39.0  --  36.3  PLT 363  --  347  APTT  --  28  --   LABPROT  --  14.1  --   INR  --  1.0  --   HEPARINUNFRC  --   --  0.22*  CREATININE 0.70  --  0.63  TROPONINIHS 3  --   --     Estimated Creatinine Clearance: 116.3 mL/min (by C-G formula based on SCr of 0.63 mg/dL).   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   Panic attacks    Personal history of chemotherapy    Personal history of radiation therapy    Sleep apnea     Medications:  No anticoagulation prior to admission per my chart review. Medication reconciliation is pending  Assessment: 53 y/o F with medical history as above presenting with acute PE. Pharmacy consulted to dose heparin .  Baseline aPTT and INR are pending. CBC is normal  Goal of Therapy:  Heparin  level 0.3-0.7 units/ml Monitor platelets by anticoagulation protocol: Yes   Plan:  8/15:  HL @ 0334 = 0.22, SUBtherapeutic - will order heparin  2100 units IV X 1 and increase drip rate to 2000 units/hr - will recheck HL 6 hrs after rate change  --Daily CBC per protocol  Viral Schramm D 12/18/2023,4:28 AM

## 2023-12-18 NOTE — ED Notes (Signed)
 Called pharmacy to clarify Xarelto  dosage. The 20mg  does not start yet. Will give the 15mg .

## 2023-12-18 NOTE — ED Notes (Signed)
 PT at bedside.

## 2023-12-18 NOTE — ED Notes (Signed)
 This RN assisted patient to the restroom via wheelchair at this time.

## 2023-12-18 NOTE — Evaluation (Signed)
 Occupational Therapy Evaluation Patient Details Name: Sherry Price MRN: [id number] DOB: April 15, 1971 Today's Date: 12/18/2023   History of Present Illness   53yoF who comes to [facility] after acute onset LUE and left CP, found to have acute PE. Pt is a household AMB at baseline, 4WW use, has really struggled with mobility over the years due to chronic bilat knee DJD.     Clinical Impressions Pt was seen for OT evaluation this date. Prior to hospital admission, pt reports since May of this year she has had a steady decline and was taken out of work. Appears she had been managing her ADL performance at Mod I level and ambulating with a rollator. Reports overall increased difficulty and plan to move to [place] to live with her daughter and son in law for more assist. Pt presents with deficits in strength, activity tolerance, balance and pain affecting safe and optimal ADL completion. Pt currently requires MOD I with increased time/effort to reach EOB. Simulated LB dressing with Mod/Max A needed for LB ADL tasks. Pt fatigues very easily with all activity. Performed x2 STS from EOB with Min/CGA to RW and able to step pivot to transport chair to be taken to the floor with Min/CGA and RW use. L sided rib/breast pain reported and RLE tremors/shakiness causing unsteadiness while standing.  Pt would benefit from skilled OT services to address noted impairments and functional limitations to maximize safety and independence while minimizing falls risk and caregiver burden. Do anticipate the need for follow up OT services upon acute hospital DC.      If plan is discharge home, recommend the following:   A little help with walking and/or transfers;A lot of help with bathing/dressing/bathroom;Assistance with cooking/housework;Assist for transportation;Help with stairs or ramp for entrance     Functional Status Assessment   Patient has had a recent decline in their functional status and demonstrates the  ability to make significant improvements in function in a reasonable and predictable amount of time.     Equipment Recommendations   Other (comment) (defer to next venue)     Recommendations for Other Services         Precautions/Restrictions   Precautions Precautions: Fall Recall of Precautions/Restrictions: Intact Restrictions Weight Bearing Restrictions Per Provider Order: No     Mobility Bed Mobility Overal bed mobility: Modified Independent             General bed mobility comments: to exit L side of bed with increased effort    Transfers Overall transfer level: Needs assistance Equipment used: Rolling walker (2 wheels) Transfers: Sit to/from Stand Sit to Stand: Contact guard assist, Min assist           General transfer comment: turns trunk to place BUEs on L side of bed and push up then turn to hold RW and take steps to transport chair with MIN/CGA for safety and cueing      Balance Overall balance assessment: Needs assistance Sitting-balance support: Feet supported Sitting balance-[name]: Good     Standing balance support: Bilateral upper extremity supported Standing balance-[name]: Poor Standing balance comment: use of RW for support/balance                           ADL either performed or assessed with clinical judgement   ADL Overall ADL's : Needs assistance/impaired  Lower Body Dressing: Minimal assistance;Moderate assistance;Sitting/lateral leans Lower Body Dressing Details (indicate cue type and reason): simulated donning/doffing socks Toilet Transfer: Contact guard assist;Minimal assistance;Rolling walker (2 wheels) Toilet Transfer Details (indicate cue type and reason): simulated from bed to transport chair to be moved to the floor from ED via step pivot using RW                 Vision         Perception         Praxis         Pertinent Vitals/Pain Pain  Assessment Pain Assessment: Faces Faces Pain Scale: Hurts little more Pain Location: L rib/breast region Pain Intervention(s): Monitored during session, Repositioned     Extremity/Trunk Assessment Upper Extremity Assessment Upper Extremity Assessment: Generalized weakness   Lower Extremity Assessment Lower Extremity Assessment: Generalized weakness       Communication Communication Communication: No apparent difficulties   Cognition Arousal: Alert Behavior During Therapy: WFL for tasks assessed/performed Cognition: No apparent impairments                               Following commands: Intact       Cueing  General Comments      HR up to 121 with standing   Exercises Other Exercises Other Exercises: Edu on role of OT in acute setting.   Shoulder Instructions      Home Living Family/patient expects to be discharged to:: Private residence Living Arrangements: Children (plan to DC to daughter's home in [place]) Available Help at Discharge: Family (dtr, dtr's spouse and young child) Type of Home: House Home Access: Stairs to enter     Home Layout: Two level     Bathroom Shower/Tub: Tub/shower unit         Home Equipment: Agricultural consultant (2 wheels);Rollator (4 wheels);Tub bench          Prior Functioning/Environment Prior Level of Function : Independent/Modified Independent             Mobility Comments: reports use of a rollator; reports more difficulty since May of this year ADLs Comments: MOD I with ADL performance    OT Problem List: Decreased strength;Decreased activity tolerance;Impaired balance (sitting and/or standing);Pain   OT Treatment/Interventions: Self-care/ADL training;Therapeutic exercise;Balance training;Therapeutic activities;DME and/or AE instruction;Patient/family education      OT Goals(Current goals can be found in the care plan section)   Acute Rehab OT Goals Patient Stated Goal: improve function OT Goal  Formulation: With patient/family Time For Goal Achievement: 01/01/24 Potential to Achieve Goals: Fair ADL Goals Pt Will Perform Lower Body Bathing: with contact guard assist;sit to/from stand;sitting/lateral leans;with supervision Pt Will Perform Lower Body Dressing: with supervision;with contact guard assist;sit to/from stand;sitting/lateral leans Pt Will Transfer to Toilet: with supervision;ambulating;regular height toilet;with contact guard assist   OT Frequency:  Min 2X/week    Co-evaluation              AM-PAC OT 6 Clicks Daily Activity     Outcome Measure Help from another person eating meals?: None Help from another person taking care of personal grooming?: A Little Help from another person toileting, which includes using toliet, bedpan, or urinal?: A Lot Help from another person bathing (including washing, rinsing, drying)?: A Lot Help from another person to put on and taking off regular upper body clothing?: A Little Help from another person to put on and taking off regular lower body clothing?:  A Lot 6 Click Score: 16   End of Session Equipment Utilized During Treatment: Rolling walker (2 wheels) Nurse Communication: Mobility status  Activity Tolerance: Patient limited by fatigue;Patient tolerated treatment well Patient left: in chair;with family/visitor present (being transported to the floor)  OT Visit Diagnosis: Other abnormalities of gait and mobility (R26.89);Unsteadiness on feet (R26.81);Muscle weakness (generalized) (M62.81)                Time: 8384-8373 OT Time Calculation (min): 11 min Charges:  OT General Charges $OT Visit: 1 Visit OT Evaluation $OT Eval Low Complexity: 1 Low Mandee Pluta, OTR/L 12/18/23, 4:56 PM  Chestine Belknap E Korinna Tat 12/18/2023, 4:53 PM

## 2023-12-18 NOTE — Assessment & Plan Note (Signed)
 Patient has significant knee arthritis and follow-up with orthopedic surgery.  Need knee replacement but has to lose weight before procedure. Significant ambulatory dysfunction and PT is recommending SNF as she was unable to stand up for some time -TOC to look for placement

## 2023-12-18 NOTE — Assessment & Plan Note (Signed)
 Blood pressure currently within goal. -Continue home amlodipine , HCTZ, labetalol 

## 2023-12-18 NOTE — Hospital Course (Addendum)
 Taken from H&P.  Sherry Price is a 53 y.o. female with medical history significant of Hypertension, Left BRCA s/p chemo /xrt, DmII , OSA, obesity who presents to ED with 2 days of chest pain with radiation down her arm and associated with nausea and sob.   Patient was found to be little tachycardic, otherwise stable vital.  Labs seems stable, BNP of 22, D-dimer at 0.91. CTA chest with a small burden, nonobstructive segmental and subsegmental PE in the right lower lobe.  No findings of right heart strain. No concern of pneumonia, pulmonary edema or pleural effusion.  Patient was started on heparin  infusion.  Echo ordered.  8/15: Vitals with mildly elevated blood pressure, respiratory viral panel negative, mild hypokalemia at 3.2 which is being repleted, mild leukocytosis likely reactive.  Lower extremity venous Doppler was negative for DVT.  Starting on Xarelto . PESI score of 85 which is low risk. Heparin  infusion was switched to Xarelto .  Patient also has ambulatory dysfunction chronic knee pain secondary to severe arthritis, trying to lose weight to become eligible for knee surgery. PT evaluated her and recommending SNF. TOC was consulted for placement.  Patient is medically stable.  8/16: Hemodynamically stable-awaiting placement.

## 2023-12-18 NOTE — ED Notes (Signed)
 Helped pt to bathroom with wheelchair. Daughter also helping.

## 2023-12-18 NOTE — NC FL2 (Signed)
 [place]  MEDICAID [id number] LEVEL OF CARE FORM     IDENTIFICATION  Patient Name: Sherry Price Eloopjf Birthdate: 11-15-1970 Sex: female Admission Date (Current Location): 12/17/2023  [facility] and [place] Number:  Chiropodist and Address:  [facility], [address] [place], [place] [address]      Provider Number: [id number]  Attending Physician Name and Address:  Caleen Qualia, MD  Relative Name and Phone Number:  Justina, Bertini [facility]  [phone] [facility] Phone)    Current Level of Care: Hospital Recommended Level of Care: Skilled Nursing Facility Prior Approval Number:    Date Approved/Denied:   PASRR Number: [id number]  Discharge Plan: SNF    Current Diagnoses: Patient Active Problem List   Diagnosis Date Noted   Arthritis    PE (pulmonary thromboembolism) (HCC) 12/17/2023   Skin lesion 05/24/2023   UTI symptoms 05/24/2023   Constipation 05/24/2023   PMB (postmenopausal bleeding) 03/27/2023   Bilateral chronic knee pain 11/13/2022   Pharyngitis 10/28/2022   Positive self-administered antigen test for COVID-19 10/16/2022   Depressive disorder 07/30/2022   Obesity, Class III, BMI 40-49.9 (morbid obesity) 07/30/2022   Chronic pain syndrome 07/14/2022   Pharmacologic therapy 07/14/2022   Disorder of skeletal system 07/14/2022   Problems influencing health status 07/14/2022   Urinary tract infection with hematuria 06/24/2022   Vitamin D  deficiency 05/14/2022   B12 deficiency 05/14/2022   Chronic pain of both knees 05/14/2022   Primary osteoarthritis of both knees 04/01/2022   Change in bowel habits 02/05/2021   Benign neoplasm of ascending colon 02/05/2021   Gastroesophageal reflux disease 06/07/2018   Bilateral leg edema 10/16/2016   SOB (shortness of breath) 10/31/2013   Anxiety 10/26/2013   Hypertension 10/26/2013   Mixed hyperlipidemia 10/26/2013   Bipolar disorder (HCC) 10/26/2013   Diabetes mellitus without complication  (HCC) 10/26/2013   Sleep apnea 10/26/2013   Malignant neoplasm of 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 (HCC) 05/21/2011    Orientation RESPIRATION BLADDER Height & Weight     Self, Time, Situation  Normal Continent Weight: (!) 307 lb (139.3 kg) Height:  5' 7 (170.2 cm)  BEHAVIORAL SYMPTOMS/MOOD NEUROLOGICAL BOWEL NUTRITION STATUS      Continent Diet (diabetic)  AMBULATORY STATUS COMMUNICATION OF NEEDS Skin   Extensive Assist Verbally Normal                       Personal Care Assistance Level of Assistance  Bathing, Feeding, Dressing Bathing Assistance: Limited assistance Feeding assistance: Independent Dressing Assistance: Limited assistance     Functional Limitations Info  Sight, Hearing, Speech Sight Info: Adequate Hearing Info: Adequate Speech Info: Adequate    SPECIAL CARE FACTORS FREQUENCY  PT (By licensed PT), OT (By licensed OT)     PT Frequency: 5x week OT Frequency: 3x week            Contractures Contractures Info: Not present    Additional Factors Info  Code Status, Allergies Code Status Info: FULL Allergies Info: Lisinopril,Losartan           Current Medications (12/18/2023):  This is the current hospital active medication list Current Facility-Administered Medications  Medication Dose Route Frequency Provider Last Rate Last Admin   acetaminophen  (TYLENOL ) tablet 650 mg  650 mg Oral Q6H PRN Thomas, Sara-Maiz A, MD   650 mg at 12/17/23 2034   Or   acetaminophen  (TYLENOL ) suppository 650 mg  650 mg Rectal Q6H PRN Debby Camila LABOR, MD  albuterol  (PROVENTIL ) (2.5 MG/3ML) 0.083% nebulizer solution 2.5 mg  2.5 mg Nebulization Q2H PRN Debby Camila LABOR, MD       ALPRAZolam  (XANAX ) tablet 0.5 mg  0.5 mg Oral Daily PRN Debby Camila LABOR, MD       amLODipine  (NORVASC ) tablet 10 mg  10 mg Oral Daily Debby Camila A, MD   10 mg at 12/18/23 0944   atorvastatin  (LIPITOR) tablet 40 mg  40 mg Oral Daily Thomas, Sara-Maiz A, MD   40 mg at  12/18/23 0946   calcium  carbonate (TUMS - dosed in mg elemental calcium ) chewable tablet 200 mg of elemental calcium   1 tablet Oral BID Debby Camila A, MD   200 mg of elemental calcium  at 12/18/23 0947   heparin  ADULT infusion 100 units/mL (25000 units/250mL)  2,000 Units/hr Intravenous Continuous Debby Camila LABOR, MD 20 mL/hr at 12/18/23 0742 2,000 Units/hr at 12/18/23 0742   hydrochlorothiazide  (HYDRODIURIL ) tablet 25 mg  25 mg Oral Daily Thomas, Sara-Maiz A, MD   25 mg at 12/18/23 0945   insulin  aspart (novoLOG ) injection 0-9 Units  0-9 Units Subcutaneous TID WC Debby Camila A, MD   1 Units at 12/18/23 1141   labetalol  (NORMODYNE ) tablet 200 mg  200 mg Oral BID Thomas, Sara-Maiz A, MD   200 mg at 12/18/23 0944   melatonin tablet 5 mg  5 mg Oral QHS Amin, Sumayya, MD       morphine  (PF) 2 MG/ML injection 2 mg  2 mg Intravenous Q4H PRN Thomas, Sara-Maiz A, MD   2 mg at 12/18/23 0341   ondansetron  (ZOFRAN ) tablet 4 mg  4 mg Oral Q6H PRN Debby Camila LABOR, MD       Or   ondansetron  (ZOFRAN ) injection 4 mg  4 mg Intravenous Q6H PRN Debby Camila LABOR, MD   4 mg at 12/17/23 2312   Rivaroxaban  (XARELTO ) tablet 15 mg  15 mg Oral BID WC Nazari, Walid A, RPH   15 mg at 12/18/23 1101   Followed by   NOREEN ON 01/08/2024] rivaroxaban  (XARELTO ) tablet 20 mg  20 mg Oral Q supper Nazari, Walid A, RPH       venlafaxine  XR (EFFEXOR -XR) 24 hr capsule 150 mg  150 mg Oral Q breakfast Debby Camila LABOR, MD   150 mg at 12/18/23 9055   Current Outpatient Medications  Medication Sig Dispense Refill   ALPRAZolam  (XANAX ) 0.5 MG tablet TAKE 1 TABLET(0.5 MG) BY MOUTH DAILY AS NEEDED FOR ANXIETY 30 tablet 5   amLODipine  (NORVASC ) 10 MG tablet TAKE 1 TABLET(10 MG) BY MOUTH DAILY 90 tablet 3   Aspirin -Acetaminophen -Caffeine  (GOODY HEADACHE PO) Take 1 packet by mouth as needed.     atorvastatin  (LIPITOR) 40 MG tablet Take 40 mg by mouth daily.     buprenorphine  (BUTRANS ) 7.5 MCG/HR Place 1 patch onto the  skin once a week. 4 patch 0   calcium  carbonate (TUMS) 500 MG chewable tablet Chew 1 tablet (200 mg of elemental calcium  total) by mouth 2 (two) times daily. 20 tablet 0   hydrochlorothiazide  (HYDRODIURIL ) 25 MG tablet TAKE 1/2 TABLET(12.5 MG) BY MOUTH DAILY 45 tablet 3   labetalol  (NORMODYNE ) 200 MG tablet TAKE 1 TABLET(200 MG) BY MOUTH TWICE DAILY 180 tablet 3   metroNIDAZOLE  (FLAGYL ) 500 MG tablet Take 500 mg by mouth 2 (two) times daily.     ondansetron  (ZOFRAN -ODT) 4 MG disintegrating tablet DISSOLVE 1 TO 2 TABLETS ON THE TONGUE EVERY 8 HOURS AS NEEDED FOR NAUSEA OR VOMITING  30 tablet 2   oxyCODONE -acetaminophen  (PERCOCET) 5-325 MG tablet Take 1 tablet by mouth every 8 (eight) hours as needed for severe pain (pain score 7-10). 90 tablet 0   tirzepatide  (MOUNJARO ) 15 MG/0.5ML Pen Inject 15 mg into the skin once a week. 6 mL 1   venlafaxine  XR (EFFEXOR -XR) 150 MG 24 hr capsule Take 1 capsule (150 mg total) by mouth daily with breakfast. 90 capsule 3   Blood Glucose Monitoring Suppl DEVI 1 each by Does not apply route in the morning, at noon, and at bedtime. May substitute to any manufacturer covered by patient's insurance. 1 each 0   mupirocin  ointment (BACTROBAN ) 2 % Apply 1 Application topically 2 (two) times daily. (Patient not taking: Reported on 12/17/2023) 22 g 0   prazosin  (MINIPRESS ) 1 MG capsule Take 1 capsule (1 mg total) by mouth at bedtime. (Patient not taking: Reported on 12/17/2023) 30 capsule 1   Suzetrigine  50 MG TABS Take 50 mg by mouth every 12 (twelve) hours as needed. Take two 50mg  capsules for your first dose at the start of a pain episode on an empty stomach, followed by 50mg  (1 capsule)  with or without food every 12 hours until pain episode is improved (Patient not taking: Reported on 12/17/2023) 29 tablet 0     Discharge Medications: Please see discharge summary for a list of discharge medications.  Relevant Imaging Results:  Relevant Lab Results:   Additional  Information SS# [id number]  Edsel DELENA Fischer, LCSW

## 2023-12-18 NOTE — Assessment & Plan Note (Addendum)
 Mild hypokalemia with potassium at 3.2 -Check magnesium - Replace potassium and monitor

## 2023-12-18 NOTE — Progress Notes (Signed)
 Progress Note   Patient: Sherry Price FMW:[id number] DOB: [birth year] DOA: 12/17/2023     1 DOS: the patient was seen and examined on 12/18/2023   Brief hospital course: Taken from H&P.  Sherry Price is a 53 y.o. female with medical history significant of Hypertension, Left BRCA s/p chemo /xrt, DmII , OSA, obesity who presents to ED with 2 days of chest pain with radiation down her arm and associated with nausea and sob.   Patient was found to be little tachycardic, otherwise stable vital.  Labs seems stable, BNP of 22, D-dimer at 0.91. CTA chest with a small burden, nonobstructive segmental and subsegmental PE in the right lower lobe.  No findings of right heart strain. No concern of pneumonia, pulmonary edema or pleural effusion.  Patient was started on heparin  infusion.  Echo ordered.  8/15: Vitals with mildly elevated blood pressure, respiratory viral panel negative, mild hypokalemia at 3.2 which is being repleted, mild leukocytosis likely reactive.  Lower extremity venous Doppler was negative for DVT.  Starting on Xarelto . PESI score of 85 which is low risk. Heparin  infusion was switched to Xarelto .  Patient also has ambulatory dysfunction chronic knee pain secondary to severe arthritis, trying to lose weight to become eligible for knee surgery. PT evaluated her and recommending SNF. TOC was consulted for placement.  Patient is medically stable  Assessment and Plan: * PE (pulmonary thromboembolism) (HCC) Patient has a small PE, lower extremity venous Doppler was negative for DVT.  PESI score of 85 which makes her low risk and she remained on room air. Echocardiogram was normal - Switching heparin  infusion with Xarelto   Arthritis Patient has significant knee arthritis and follow-up with orthopedic surgery.  Need knee replacement but has to lose weight before procedure. Significant ambulatory dysfunction and PT is recommending SNF as she was unable to stand up for some  time -TOC to look for placement  Hypokalemia Mild hypokalemia with potassium at 3.2 -Check magnesium - Replace potassium and monitor  Hypertension Blood pressure currently within goal. -Continue home amlodipine , HCTZ, labetalol   Diabetes mellitus without complication (HCC) Patient was just taking Mounjaro  with most recent A1c of 5.8. 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   Weight as of this encounter: 139.3 kg.   - This will complicate overall prognosis -Encouraged weight loss  Anxiety and depression - Continue home Effexor  and as needed Xanax   Dyslipidemia - Continue home statin   Subjective: Patient was seen and examined today.  Denies any chest pain or shortness of breath.  She was complaining of worsening knee pain and unable to bear any weight on it.  Physical Exam: Vitals:   12/18/23 0743 12/18/23 0930 12/18/23 1230 12/18/23 1241  BP:  (!) 161/87 132/88   Pulse:  100 97   Resp:  14 17   Temp: 98.3 F (36.8 C)   98.2 F (36.8 C)  TempSrc: Oral   Oral  SpO2:  100% 100%   Weight:      Height:       General.  Morbidly obese lady, in no acute distress. Pulmonary.  Lungs clear bilaterally, normal respiratory effort. CV.  Regular rate and rhythm, no JVD, rub or murmur. Abdomen.  Soft, nontender,  nondistended, BS positive. CNS.  Alert and oriented .  No focal neurologic deficit. Extremities.  No edema, no cyanosis, pulses intact and symmetrical. Psychiatry.  Judgment and insight appears normal.   Data Reviewed: Prior data reviewed  Family Communication: Discussed with patient  Disposition: Status is: Inpatient Remains inpatient appropriate because: SNF placement  Planned  Discharge Destination: Skilled nursing facility  DVT prophylaxis.  Xarelto  Time spent: 50 minutes  This record has been created using Conservation officer, historic buildings. Errors have been sought and corrected,but may not always be located. Such creation errors do not reflect on the standard of care.   Author: Amaryllis Dare, MD 12/18/2023 2:27 PM  For on call review [email or web address].

## 2023-12-18 NOTE — ED Notes (Signed)
 Was about to give Xarelto  but pt is lying supine getting echocardiogram. Will give after.

## 2023-12-18 NOTE — ED Notes (Signed)
 Messaged provider for sleep aid and provided water per request.

## 2023-12-18 NOTE — Telephone Encounter (Signed)
 Patient Product/process development scientist completed.    The patient is insured through [facility]. Patient has Medicare and is not eligible for a copay card, but may be able to apply for patient assistance or Medicare RX Payment Plan (Patient Must reach out to their plan, if eligible for payment plan), if available.    Ran test claim for Eliquis Starter pack and the current 30 day co-pay is $0.00.  Ran test claim for Xarelto  Starter pack and the current 30 day co-pay is $0.00.  This test claim was processed through [facility]- copay amounts may vary at other pharmacies due to pharmacy/plan contracts, or as the patient moves through the different stages of their insurance plan.     Reyes Sharps, CPHT Pharmacy Technician III Certified Patient Advocate [facility] Patient Advocate Team Direct Number: [phone]  Fax: [phone]

## 2023-12-18 NOTE — Assessment & Plan Note (Signed)
 Estimated body mass index is 48.08 kg/m as calculated from the following:   Height as of this encounter: 5' 7 (1.702 m).   Weight as of this encounter: 139.3 kg.   - This will complicate overall prognosis -Encouraged weight loss

## 2023-12-18 NOTE — ED Notes (Signed)
 Patient provided with water  at bedside at this time.

## 2023-12-18 NOTE — Care Management (Signed)
 Was notified from UR that this patient was code 8. Called patient and explained MOON, however patient stated she did not understand this, as she was told she was a inpatientl. She asked  this RNCM to call back when her daughter was there so it could be explained to her.  The patient stated that she would appeal  discharge if she was not feeling better.  Did discuss that since she is now in OBV, she could not appeal. Told her will discuss this with team. Discussed with MD, UR, the patient is not discharging today, she is being worked up for SNF for disposition.

## 2023-12-18 NOTE — ED Notes (Signed)
 Messaged pharmacy for missing Xarelto  20mg .

## 2023-12-18 NOTE — Assessment & Plan Note (Signed)
-   Continue home statin

## 2023-12-19 DIAGNOSIS — I2699 Other pulmonary embolism without acute cor pulmonale: Secondary | ICD-10-CM | POA: Diagnosis not present

## 2023-12-19 DIAGNOSIS — I10 Essential (primary) hypertension: Secondary | ICD-10-CM | POA: Diagnosis not present

## 2023-12-19 DIAGNOSIS — E119 Type 2 diabetes mellitus without complications: Secondary | ICD-10-CM | POA: Diagnosis not present

## 2023-12-19 DIAGNOSIS — M1990 Unspecified osteoarthritis, unspecified site: Secondary | ICD-10-CM | POA: Diagnosis not present

## 2023-12-19 LAB — GLUCOSE, CAPILLARY
Glucose-Capillary: 125 mg/dL — ABNORMAL HIGH (ref 70–99)
Glucose-Capillary: 120 mg/dL — ABNORMAL HIGH (ref 70–99)
Glucose-Capillary: 121 mg/dL — ABNORMAL HIGH (ref 70–99)
Glucose-Capillary: 109 mg/dL — ABNORMAL HIGH (ref 70–99)

## 2023-12-19 MED ORDER — ALPRAZOLAM 0.5 MG PO TABS
0.5000 mg | ORAL_TABLET | Freq: Two times a day (BID) | ORAL | Status: DC | PRN
  Administered 2023-12-21 – 2023-12-22 (×3): 0.5 mg via ORAL
  Filled 2023-12-19 (×3): qty 1

## 2023-12-19 NOTE — Discharge Instructions (Signed)
 Your nurse navigator, Liandro Thelin, may be reached at [phone]

## 2023-12-19 NOTE — Assessment & Plan Note (Signed)
 Patient has a small PE, lower extremity venous Doppler was negative for DVT.  PESI score of 85 which makes her low risk and she remained on room air. Echocardiogram was normal - Switching heparin  infusion with Xarelto -continue

## 2023-12-19 NOTE — TOC Initial Note (Addendum)
 Transition of Care [facility]) - Initial/Assessment Note    Patient Details  Name: Sherry Price MRN: [id number] Date of Birth: 1971/01/07  Transition of Care [facility]) CM/SW Contact:    [place] L Pharoah Goggins, LCSW Phone Number: 12/19/2023, 1:30 PM  Clinical Narrative:                  No consult received for this patient. Patient is a code 55.   CSW met with patient. Daughter was at bedside. Daughter advised that she wants a bed search completed in [place], [place]. She provided the names of the facilities [facility] and [address]   Patient declined bed offers in [facility] She advised that she was uncertain why her status has changed when she is not medically stable for discharge.   1:33pm: CSW spoke with Ms. Bostick [phone]. She advised that patient wants a SNF in [place], [place]. She reported a concern regarding the code 17. She advised that patient is not medically stable. She advised that she will discuss options with the patient.   CSW agreed to do bed search in [place], [place].     1:45pm: FL2 and therapy notes faxed to [facility], [facility] and [address]    Patient Goals and CMS Choice            Expected Discharge Plan and Services                                              Prior Living Arrangements/Services                       Activities of Daily Living   ADL Screening (condition at time of admission) Independently performs ADLs?: No Does the patient have a NEW difficulty with bathing/dressing/toileting/self-feeding that is expected to last >3 days?: No Does the patient have a NEW difficulty with getting in/out of bed, walking, or climbing stairs that is expected to last >3 days?: No Does the patient have a NEW difficulty with communication that is expected to last >3 days?: No Is the patient deaf or have difficulty hearing?: No Does the patient have difficulty seeing, even when wearing glasses/contacts?: No Does the patient  have difficulty concentrating, remembering, or making decisions?: No  Permission Sought/Granted                  Emotional Assessment              Admission diagnosis:  PE (pulmonary thromboembolism) (HCC) [I26.99] Acute pulmonary embolism without acute cor pulmonale, unspecified pulmonary embolism type (HCC) [I26.99] Patient Active Problem List   Diagnosis Date Noted   Hypokalemia 12/18/2023   Dyslipidemia 12/18/2023   Arthritis    PE (pulmonary thromboembolism) (HCC) 12/17/2023   Skin lesion 05/24/2023   UTI symptoms 05/24/2023   Constipation 05/24/2023   PMB (postmenopausal bleeding) 03/27/2023   Bilateral chronic knee pain 11/13/2022   Pharyngitis 10/28/2022   Positive self-administered antigen test for COVID-19 10/16/2022   Anxiety and depression 07/30/2022   Obesity, Class III, BMI 40-49.9 (morbid obesity) 07/30/2022   Chronic pain syndrome 07/14/2022   Pharmacologic therapy 07/14/2022   Disorder of skeletal system 07/14/2022   Problems influencing health status 07/14/2022   Urinary tract infection with hematuria 06/24/2022   Vitamin D  deficiency 05/14/2022   B12 deficiency 05/14/2022   Chronic pain of both  knees 05/14/2022   Primary osteoarthritis of both knees 04/01/2022   Change in bowel habits 02/05/2021   Benign neoplasm of ascending colon 02/05/2021   Gastroesophageal reflux disease 06/07/2018   Bilateral leg edema 10/16/2016   SOB (shortness of breath) 10/31/2013   Anxiety 10/26/2013   Hypertension 10/26/2013   Mixed hyperlipidemia 10/26/2013   Bipolar disorder (HCC) 10/26/2013   Diabetes mellitus without complication (HCC) 10/26/2013   Sleep apnea 10/26/2013   Malignant neoplasm of 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 (HCC) 05/21/2011   PCP:  [name], NP Pharmacy:   [facility] [phone] [place] [place], [place] - [address] AT [facility] [address] [place] [address] [place] [address] Phone: [phone] Fax:  [phone]     Social Drivers of Health (SDOH) Social History: SDOH Screenings   Food Insecurity: No Food Insecurity (12/18/2023)  Housing: High Risk (12/18/2023)  Transportation Needs: No Transportation Needs (12/18/2023)  Utilities: At Risk (12/18/2023)  Alcohol Screen: Low Risk  (03/11/2023)  Depression (PHQ2-9): High Risk (11/05/2023)  Financial Resource Strain: Low Risk  (03/11/2023)  Physical Activity: Inactive (03/11/2023)  Social Connections: Moderately Isolated (03/11/2023)  Stress: Stress Concern Present (03/11/2023)  Tobacco Use: Medium Risk (12/15/2023)   Received from [facility]  Health Literacy: Inadequate Health Literacy (03/11/2023)   SDOH Interventions:     Readmission Risk Interventions     No data to display

## 2023-12-19 NOTE — Progress Notes (Signed)
 Progress Note   Patient: Sherry Price FMW:[id number] DOB: 1971/01/01 DOA: 12/17/2023     1 DOS: the patient was seen and examined on 12/19/2023   Brief hospital course: Taken from H&P.  Sherry Price is a 53 y.o. female with medical history significant of Hypertension, Left BRCA s/p chemo /xrt, DmII , OSA, obesity who presents to ED with 2 days of chest pain with radiation down her arm and associated with nausea and sob.   Patient was found to be little tachycardic, otherwise stable vital.  Labs seems stable, BNP of 22, D-dimer at 0.91. CTA chest with a small burden, nonobstructive segmental and subsegmental PE in the right lower lobe.  No findings of right heart strain. No concern of pneumonia, pulmonary edema or pleural effusion.  Patient was started on heparin  infusion.  Echo ordered.  8/15: Vitals with mildly elevated blood pressure, respiratory viral panel negative, mild hypokalemia at 3.2 which is being repleted, mild leukocytosis likely reactive.  Lower extremity venous Doppler was negative for DVT.  Starting on Xarelto . PESI score of 85 which is low risk. Heparin  infusion was switched to Xarelto .  Patient also has ambulatory dysfunction chronic knee pain secondary to severe arthritis, trying to lose weight to become eligible for knee surgery. PT evaluated her and recommending SNF. TOC was consulted for placement.  Patient is medically stable.  8/16: Hemodynamically stable-awaiting placement.  Assessment and Plan: * PE (pulmonary thromboembolism) (HCC) Patient has a small PE, lower extremity venous Doppler was negative for DVT.  PESI score of 85 which makes her low risk and she remained on room air. Echocardiogram was normal - Switching heparin  infusion with Xarelto -continue  Arthritis Patient has significant knee arthritis and follow-up with orthopedic surgery.  Need knee replacement but has to lose weight before procedure. Significant ambulatory dysfunction and  PT is recommending SNF as she was unable to stand up for some time -TOC to look for placement  Hypokalemia Mild hypokalemia with potassium at 3.2 -Check magnesium - Replace potassium and monitor  Hypertension Blood pressure currently within goal. -Continue home amlodipine , HCTZ, labetalol   Diabetes mellitus without complication (HCC) Patient was just taking Mounjaro  with most recent A1c of 5.8. CBG within goal -SSI for now  Malignant neoplasm of 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 [facility]) S/p chemo and radiation -Outpatient follow-up  Obesity, Class III, BMI 40-49.9 (morbid obesity) Estimated body mass index is 48.08 kg/m as calculated from the following:   Height as of this encounter: 5' 7 (1.702 m).   Weight as of this encounter: 139.3 kg.   - This will complicate overall prognosis -Encouraged weight loss  Anxiety and depression - Continue home Effexor  and as needed Xanax   Dyslipidemia - Continue home statin   Subjective: Patient was seen and examined today.  Continue to complain about knee pain and unable to walk.  Daughter at bedside and want her to go to rehab place near [place]  Physical Exam: Vitals:   12/19/23 0306 12/19/23 0706 12/19/23 1106 12/19/23 1506  BP: (!) 146/111 (!) 161/103 (!) 141/101 (!) 140/84  Pulse: 99 (!) 102 94 90  Resp: 20 20 20 19   Temp: 97.7 F (36.5 C) 98.2 F (36.8 C) (!) 97.5 F (36.4 C) 98.4 F (36.9 C)  TempSrc: Oral Oral Oral Oral  SpO2: 100% 100% 100% 100%  Weight:      Height:       General.  Morbidly obese lady, in no acute distress. Pulmonary.  Lungs clear bilaterally, normal respiratory  effort. CV.  Regular rate and rhythm, no JVD, rub or murmur. Abdomen.  Soft, nontender, nondistended, BS positive. CNS.  Alert and oriented .  No focal neurologic deficit. Extremities.  No edema, no cyanosis, pulses intact and symmetrical. Psychiatry.  Judgment and insight appears normal.   Data Reviewed: Prior data  reviewed  Family Communication: Discussed with daughter at bedside  Disposition: Status is: Inpatient Remains inpatient appropriate because: SNF placement  Planned Discharge Destination: Skilled nursing facility  DVT prophylaxis.  Xarelto  Time spent: 40 minutes  This record has been created using Conservation officer, historic buildings. Errors have been sought and corrected,but may not always be located. Such creation errors do not reflect on the standard of care.   Author: Amaryllis Dare, MD 12/19/2023 4:40 PM  For on call review [email or web address].

## 2023-12-19 NOTE — Plan of Care (Signed)

## 2023-12-19 NOTE — Progress Notes (Addendum)
 Introduced patient to role of Statistician. Intake questions completed.  Patient states she is being evicted from her current residence on Monday, as she is $2628 behind on rent. She states she has already been through the appeals process.  Currently receives $846/mo in SSDI. Also receives assistance in form of SNAP and united health assistance.  Patient d/c plan to SNF in [place] area as it will be closer to her daughter Cal. Patient states she has a total of 4 children. All are supportive. Will provide application for [employer] this is NOT an immediate housing solution.  Encouraged to call with questions or concerns.

## 2023-12-19 NOTE — Care Management CC44 (Signed)
 Condition Code 44 Documentation Completed  Patient Details  Name: Sherry Price MRN: [id number] Date of Birth: January 12, 1971   Condition Code 44 given:  Yes Patient signature on Condition Code 44 notice:  Yes Documentation of 2 MD's agreement:  Yes Code 44 added to claim:  Yes    [place] L Shirin Echeverry, LCSW 12/19/2023, 10:19 AM

## 2023-12-20 DIAGNOSIS — I2699 Other pulmonary embolism without acute cor pulmonale: Secondary | ICD-10-CM | POA: Diagnosis not present

## 2023-12-20 LAB — BASIC METABOLIC PANEL WITH GFR
Anion gap: 11 (ref 5–15)
BUN: 14 mg/dL (ref 6–20)
CO2: 23 mmol/L (ref 22–32)
Calcium: 9.5 mg/dL (ref 8.9–10.3)
Chloride: 102 mmol/L (ref 98–111)
Creatinine, Ser: 0.69 mg/dL (ref 0.44–1.00)
GFR, Estimated: >60 mL/min (ref >60)
Glucose, Bld: 115 mg/dL — ABNORMAL HIGH (ref 70–99)
Potassium: 3.5 mmol/L (ref 3.5–5.1)
Sodium: 136 mmol/L (ref 135–145)

## 2023-12-20 LAB — CBC WITH DIFFERENTIAL/PLATELET
Abs Immature Granulocytes: 0.02 K/uL (ref 0.00–0.07)
Basophils Absolute: 0.1 K/uL (ref 0.0–0.1)
Basophils Relative: 1 %
Eosinophils Absolute: 0.4 K/uL (ref 0.0–0.5)
Eosinophils Relative: 5 %
HCT: 38.6 % (ref 36.0–46.0)
Hemoglobin: 12.7 g/dL (ref 12.0–15.0)
Immature Granulocytes: 0 %
Lymphocytes Relative: 20 %
Lymphs Abs: 1.9 K/uL (ref 0.7–4.0)
MCH: 31.6 pg (ref 26.0–34.0)
MCHC: 32.9 g/dL (ref 30.0–36.0)
MCV: 96 fL (ref 80.0–100.0)
Monocytes Absolute: 0.6 K/uL (ref 0.1–1.0)
Monocytes Relative: 7 %
Neutro Abs: 6.6 K/uL (ref 1.7–7.7)
Neutrophils Relative %: 67 %
Platelets: 373 K/uL (ref 150–400)
RBC: 4.02 MIL/uL (ref 3.87–5.11)
RDW: 13.5 % (ref 11.5–15.5)
WBC: 9.6 K/uL (ref 4.0–10.5)
nRBC: 0 % (ref 0.0–0.2)

## 2023-12-20 LAB — GLUCOSE, CAPILLARY
Glucose-Capillary: 125 mg/dL — ABNORMAL HIGH (ref 70–99)
Glucose-Capillary: 130 mg/dL — ABNORMAL HIGH (ref 70–99)
Glucose-Capillary: 77 mg/dL (ref 70–99)
Glucose-Capillary: 136 mg/dL — ABNORMAL HIGH (ref 70–99)

## 2023-12-20 MED ORDER — POTASSIUM CHLORIDE CRYS ER 20 MEQ PO TBCR
40.0000 meq | EXTENDED_RELEASE_TABLET | Freq: Once | ORAL | Status: AC
  Administered 2023-12-20: 40 meq via ORAL
  Filled 2023-12-20: qty 2

## 2023-12-20 MED ORDER — POLYETHYLENE GLYCOL 3350 17 G PO PACK
17.0000 g | PACK | Freq: Every day | ORAL | Status: DC
  Administered 2023-12-20 – 2023-12-22 (×3): 17 g via ORAL
  Filled 2023-12-20 (×4): qty 1

## 2023-12-20 NOTE — Progress Notes (Addendum)
 Progress Note    Sherry Price  FMW:[id number] DOB: 06-23-1970  DOA: 12/17/2023 PCP: [name], NP      Brief Narrative:    Medical records reviewed and are as summarized below:  Sherry Price is a 53 y.o. female with medical history significant of Hypertension, Left BRCA s/p chemo /xrt, type II DM, OSA, obesity, who presented to the hospital because of chest pain, shortness of breath and nausea.  CTA chest with a small burden, nonobstructive segmental and subsegmental PE in the right lower lobe.  No findings of right heart strain. No concern of pneumonia, pulmonary edema or pleural effusion.   She was treated with IV heparin  drip and switched to Xarelto .    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  (Class III obesity)    Acute pulmonary embolism without right heart strain: Continue Xarelto . S/p treatment with IV heparin  drip.   Right knee arthritis: Analgesics as needed for pain.  She will need knee replacement but has to lose weight before procedure can be done.  Outpatient follow-up with orthopedic surgeon.   Constipation: Laxatives as needed.     Hypokalemia: Improved. Continue potassium repletion.   History of malignant neoplasm of upper outer quadrant of female breast: S/p chemotherapy and radiation therapy.  Outpatient follow-up with oncologist.   Debility, general weakness: Awaiting placement to SNF   Comorbidities include hypertension, type II DM recent A1c 5.8 (on Mounjaro  as an outpatient), hyperlipidemia  Diet Order             Diet Carb Modified Fluid consistency:  Thin; Room service appropriate? Yes  Diet effective now                                  Consultants: None  Procedures: None    Medications:    amLODipine   10 mg Oral Daily   atorvastatin   40 mg Oral Daily   calcium  carbonate  1 tablet Oral BID   hydrochlorothiazide   25 mg Oral Daily   insulin  aspart  0-9 Units Subcutaneous TID WC   labetalol   200 mg Oral BID   melatonin  5 mg Oral QHS   polyethylene glycol  17 g Oral Daily   potassium chloride   40 mEq Oral Once   Rivaroxaban   15 mg Oral BID WC   Followed by   NOREEN ON 01/08/2024] rivaroxaban   20 mg Oral Q supper   venlafaxine  XR  150 mg Oral Q breakfast   Continuous Infusions:   Anti-infectives (From admission, onward)    None              Family Communication/Anticipated D/C date and plan/Code Status   DVT prophylaxis:  Rivaroxaban  (XARELTO ) tablet 15 mg  rivaroxaban  (XARELTO ) tablet 20 mg     Code Status: Full Code  Family Communication: Her sister was on speaker phone during this encounter Disposition Plan: Plan to discharge to SNF   Status is: Observation The patient will require care spanning > 2 midnights and should be moved to inpatient because: Awaiting placement  to SNF       Subjective:   Interval events noted.  She complains of constipation.  She still has pain in the right knee.  No vomiting or abdominal pain.  Objective:    Vitals:   12/19/23 2307 12/19/23 2355 12/20/23 0355 12/20/23 0915  BP:  (!) 158/107 117/64 (!) 142/101  Pulse: 95 97 96 (!) 102  Resp:    18  Temp:  98.8 F (37.1 C) 98.7 F (37.1 C) 98.3 F (36.8 C)  TempSrc:  Oral Oral Oral  SpO2: 98% 100% 100% 100%  Weight:      Height:       No data found.   Intake/Output Summary (Last 24 hours) at 12/20/2023 1503 Last data filed at 12/20/2023 1300 Gross per 24 hour  Intake 600 ml  Output --  Net 600 ml   Filed Weights   12/17/23 1242  Weight: (!) 139.3 kg    Exam:  GEN:  NAD SKIN: Warm and dry EYES: No pallor or icterus ENT: MMM CV: RRR PULM: CTA B ABD: soft, obese, NT, +BS CNS: AAO x 3, non focal EXT: No edema or tenderness       Data Reviewed:   I have personally reviewed following labs and imaging studies:  Labs: Labs show the following:   Basic Metabolic Panel: Recent Labs  Lab 12/17/23 1248 12/18/23 0334 12/18/23 0344 12/20/23 0819  NA 138 138  --  136  K 3.7 3.2*  --  3.5  CL 102 104  --  102  CO2 24 23  --  23  GLUCOSE 139* 100*  --  115*  BUN 8 11  --  14  CREATININE 0.70 0.63  --  0.69  CALCIUM  9.3 8.7*  --  9.5  MG  --   --  2.2  --    GFR Estimated Creatinine Clearance: 116.3 mL/min (by C-G formula based on SCr of 0.69 mg/dL). Liver Function Tests: Recent Labs  Lab 12/18/23 0334  AST 22  ALT 16  ALKPHOS 54  BILITOT 0.9  PROT 6.6  ALBUMIN 3.4*   No results for input(s): LIPASE, AMYLASE in the last 168 hours. No results for input(s): AMMONIA in the last 168 hours. Coagulation profile Recent Labs  Lab 12/17/23 1926  INR 1.0    CBC: Recent Labs  Lab 12/17/23 1248 12/18/23 0334 12/20/23 0819  WBC 7.9 11.2* 9.6  NEUTROABS  --   --  6.6  HGB 13.1 12.1 12.7  HCT 39.0 36.3 38.6  MCV 95.8 95.5 96.0  PLT 363 347 373   Cardiac Enzymes: No results for input(s): CKTOTAL, CKMB, CKMBINDEX, TROPONINI in the last 168 hours. BNP (last 3 results) No results for input(s): PROBNP in the last 8760 hours. CBG: Recent Labs  Lab 12/19/23 1131 12/19/23 1654 12/19/23 2126 12/20/23 0815 12/20/23 1216  GLUCAP 120* 121* 109* 125* 130*   D-Dimer: Recent Labs    12/17/23 1640  DDIMER 0.91*   Hgb A1c: No results for input(s): HGBA1C in the last 72 hours. Lipid Profile: No results for input(s): CHOL, HDL, LDLCALC, TRIG, CHOLHDL, LDLDIRECT in the last 72 hours. Thyroid  function studies: No results for input(s): TSH, T4TOTAL, T3FREE, THYROIDAB in the last 72  hours.  Invalid input(s): FREET3 Anemia work up: No results for input(s): VITAMINB12, FOLATE, FERRITIN, TIBC, IRON, RETICCTPCT in the last 72 hours. Sepsis Labs: Recent Labs  Lab 12/17/23 1248 12/18/23 0334 12/20/23 0819  WBC 7.9 11.2* 9.6    Microbiology  Recent Results (from the past 240 hours)  Respiratory (~20 pathogens) panel by PCR     Status: None   Collection Time: 12/17/23  9:28 PM   Specimen: Nasopharyngeal Swab; Respiratory  Result Value Ref Range Status   Adenovirus NOT DETECTED NOT DETECTED Final   Coronavirus 229E NOT DETECTED NOT DETECTED Final    Comment: (NOTE) The Coronavirus on the Respiratory Panel, DOES NOT test for the novel  Coronavirus (2019 nCoV)    Coronavirus HKU1 NOT DETECTED NOT DETECTED Final   Coronavirus NL63 NOT DETECTED NOT DETECTED Final   Coronavirus OC43 NOT DETECTED NOT DETECTED Final   Metapneumovirus NOT DETECTED NOT DETECTED Final   Rhinovirus / Enterovirus NOT DETECTED NOT DETECTED Final   Influenza A NOT DETECTED NOT DETECTED Final   Influenza B NOT DETECTED NOT DETECTED Final   Parainfluenza Virus 1 NOT DETECTED NOT DETECTED Final   Parainfluenza Virus 2 NOT DETECTED NOT DETECTED Final   Parainfluenza Virus 3 NOT DETECTED NOT DETECTED Final   Parainfluenza Virus 4 NOT DETECTED NOT DETECTED Final   Respiratory Syncytial Virus NOT DETECTED NOT DETECTED Final   Bordetella pertussis NOT DETECTED NOT DETECTED Final   Bordetella Parapertussis NOT DETECTED NOT DETECTED Final   Chlamydophila pneumoniae NOT DETECTED NOT DETECTED Final   Mycoplasma pneumoniae NOT DETECTED NOT DETECTED Final    Comment: Performed at [facility], [address], [place], [place] [address]    Procedures and diagnostic studies:  No results found.             LOS: 1 day   Sherry Price  [facility]   Pager on [email or web address]. If 7PM-7AM, please contact night-coverage at [email or web address]     12/20/2023, 3:03 PM

## 2023-12-20 NOTE — Progress Notes (Signed)
 Mobility Specialist - Progress Note     12/20/23 1326  Mobility  Activity Stood at bedside;Pivoted/transferred from bed to chair  Level of Assistance Contact guard assist, steadying assist  Assistive Device Front wheel walker  Range of Motion/Exercises Active  Activity Response Tolerated well  Mobility Referral Yes  Mobility visit 1 Mobility  Mobility Specialist Start Time (ACUTE ONLY) 1303  Mobility Specialist Stop Time (ACUTE ONLY) 1318  Mobility Specialist Time Calculation (min) (ACUTE ONLY) 15 min   Pt resting in bed on RA upon entry. Pt given education on proper mechanics of getting up from the bed to the walker safely. Pt endorses weakness in arms makes it difficult to push up from the bed. Pt pushed up with both hands on one side of the bed and swung her legs around to stand. MS utilized blocking and CGA/MinA holding to prevent fall. Pt transferred to recliner and left with needs in reach. Family present at bedside.   Guido Rumble Mobility Specialist 12/20/23, 1:40 PM

## 2023-12-20 NOTE — Plan of Care (Signed)
  Problem: Coping: Goal: Ability to adjust to condition or change in health will improve Outcome: Progressing   Problem: Fluid Volume: Goal: Ability to maintain a balanced intake and output will improve Outcome: Progressing   Problem: Health Behavior/Discharge Planning: Goal: Ability to manage health-related needs will improve Outcome: Progressing   Problem: Skin Integrity: Goal: Risk for impaired skin integrity will decrease Outcome: Progressing   

## 2023-12-21 ENCOUNTER — Telehealth: Payer: Self-pay | Admitting: Physical Medicine & Rehabilitation

## 2023-12-21 ENCOUNTER — Encounter: Admitting: Physical Medicine & Rehabilitation

## 2023-12-21 DIAGNOSIS — I10 Essential (primary) hypertension: Secondary | ICD-10-CM | POA: Diagnosis present

## 2023-12-21 DIAGNOSIS — E876 Hypokalemia: Secondary | ICD-10-CM | POA: Diagnosis present

## 2023-12-21 DIAGNOSIS — D72829 Elevated white blood cell count, unspecified: Secondary | ICD-10-CM | POA: Diagnosis present

## 2023-12-21 DIAGNOSIS — F419 Anxiety disorder, unspecified: Secondary | ICD-10-CM | POA: Diagnosis present

## 2023-12-21 DIAGNOSIS — Z9221 Personal history of antineoplastic chemotherapy: Secondary | ICD-10-CM | POA: Diagnosis not present

## 2023-12-21 DIAGNOSIS — M1711 Unilateral primary osteoarthritis, right knee: Secondary | ICD-10-CM | POA: Diagnosis present

## 2023-12-21 DIAGNOSIS — I2699 Other pulmonary embolism without acute cor pulmonale: Secondary | ICD-10-CM | POA: Diagnosis present

## 2023-12-21 DIAGNOSIS — Z86711 Personal history of pulmonary embolism: Secondary | ICD-10-CM | POA: Diagnosis not present

## 2023-12-21 DIAGNOSIS — Z8249 Family history of ischemic heart disease and other diseases of the circulatory system: Secondary | ICD-10-CM | POA: Diagnosis not present

## 2023-12-21 DIAGNOSIS — E118 Type 2 diabetes mellitus with unspecified complications: Secondary | ICD-10-CM | POA: Diagnosis not present

## 2023-12-21 DIAGNOSIS — E66813 Obesity, class 3: Secondary | ICD-10-CM | POA: Diagnosis present

## 2023-12-21 DIAGNOSIS — C50419 Malignant neoplasm of upper-outer quadrant of unspecified female breast: Secondary | ICD-10-CM | POA: Diagnosis not present

## 2023-12-21 DIAGNOSIS — M1990 Unspecified osteoarthritis, unspecified site: Secondary | ICD-10-CM | POA: Diagnosis not present

## 2023-12-21 DIAGNOSIS — Z6841 Body Mass Index (BMI) 40.0 and over, adult: Secondary | ICD-10-CM | POA: Diagnosis not present

## 2023-12-21 DIAGNOSIS — G4733 Obstructive sleep apnea (adult) (pediatric): Secondary | ICD-10-CM | POA: Diagnosis present

## 2023-12-21 DIAGNOSIS — I2693 Single subsegmental pulmonary embolism without acute cor pulmonale: Secondary | ICD-10-CM | POA: Diagnosis present

## 2023-12-21 DIAGNOSIS — K5900 Constipation, unspecified: Secondary | ICD-10-CM | POA: Diagnosis present

## 2023-12-21 DIAGNOSIS — E119 Type 2 diabetes mellitus without complications: Secondary | ICD-10-CM | POA: Diagnosis present

## 2023-12-21 DIAGNOSIS — R2681 Unsteadiness on feet: Secondary | ICD-10-CM | POA: Diagnosis not present

## 2023-12-21 DIAGNOSIS — E569 Vitamin deficiency, unspecified: Secondary | ICD-10-CM | POA: Diagnosis not present

## 2023-12-21 DIAGNOSIS — Z923 Personal history of irradiation: Secondary | ICD-10-CM | POA: Diagnosis not present

## 2023-12-21 DIAGNOSIS — E785 Hyperlipidemia, unspecified: Secondary | ICD-10-CM | POA: Diagnosis present

## 2023-12-21 DIAGNOSIS — Z7985 Long-term (current) use of injectable non-insulin antidiabetic drugs: Secondary | ICD-10-CM | POA: Diagnosis not present

## 2023-12-21 DIAGNOSIS — Z9012 Acquired absence of left breast and nipple: Secondary | ICD-10-CM | POA: Diagnosis not present

## 2023-12-21 DIAGNOSIS — M6259 Muscle wasting and atrophy, not elsewhere classified, multiple sites: Secondary | ICD-10-CM | POA: Diagnosis not present

## 2023-12-21 DIAGNOSIS — Z833 Family history of diabetes mellitus: Secondary | ICD-10-CM | POA: Diagnosis not present

## 2023-12-21 DIAGNOSIS — Z736 Limitation of activities due to disability: Secondary | ICD-10-CM | POA: Diagnosis not present

## 2023-12-21 DIAGNOSIS — G894 Chronic pain syndrome: Secondary | ICD-10-CM | POA: Diagnosis present

## 2023-12-21 DIAGNOSIS — Z87891 Personal history of nicotine dependence: Secondary | ICD-10-CM | POA: Diagnosis not present

## 2023-12-21 DIAGNOSIS — R5381 Other malaise: Secondary | ICD-10-CM | POA: Diagnosis present

## 2023-12-21 DIAGNOSIS — F32A Depression, unspecified: Secondary | ICD-10-CM | POA: Diagnosis present

## 2023-12-21 DIAGNOSIS — R531 Weakness: Secondary | ICD-10-CM | POA: Diagnosis present

## 2023-12-21 DIAGNOSIS — Z853 Personal history of malignant neoplasm of breast: Secondary | ICD-10-CM | POA: Diagnosis not present

## 2023-12-21 LAB — GLUCOSE, CAPILLARY
Glucose-Capillary: 110 mg/dL — ABNORMAL HIGH (ref 70–99)
Glucose-Capillary: 123 mg/dL — ABNORMAL HIGH (ref 70–99)
Glucose-Capillary: 106 mg/dL — ABNORMAL HIGH (ref 70–99)

## 2023-12-21 MED ORDER — SENNOSIDES-DOCUSATE SODIUM 8.6-50 MG PO TABS
1.0000 | ORAL_TABLET | Freq: Every day | ORAL | Status: DC
  Administered 2023-12-21 – 2023-12-22 (×2): 1 via ORAL
  Filled 2023-12-21 (×2): qty 1

## 2023-12-21 NOTE — Progress Notes (Signed)
 Occupational Therapy Treatment Patient Details Name: Sherry Price MRN: [id number] DOB: 04-28-1971 Today's Date: 12/21/2023   History of present illness 53yoF who comes to [facility] after acute onset LUE and left CP, found to have acute PE. Pt is a household AMB at baseline, 4WW use, has really struggled with mobility over the years due to chronic bilat knee DJD.   OT comments  Pt seen for OT treatment on this date. Upon arrival to room pt seated EOB, agreeable to tx. Pt education and training on use of AE (reacher and sock aid) for LB dressing tasks - doffing/donning gripper socks at EOB with verbal and visual cuing for form and technique, education on use of LH shoe horn as pt does not have shoes to trial with.  Pt with improved ability to access LEs for LB ADLs and would benefit from ongoing training with AE.  Pt with possible transition to SNF level care soon and is concerned regarding discharge plan.  Discuss on recommendation for next venue of care to increase awareness of transition along the continuum of care. Pt making good progress toward goals, will continue to follow POC. Discharge recommendation remains appropriate.        If plan is discharge home, recommend the following:  A little help with walking and/or transfers;A lot of help with bathing/dressing/bathroom;Assistance with cooking/housework;Assist for transportation;Help with stairs or ramp for entrance   Equipment Recommendations  Other (comment) (Defer to next venue of care)    Recommendations for Other Services      Precautions / Restrictions Precautions Precautions: Fall Recall of Precautions/Restrictions: Intact Restrictions Weight Bearing Restrictions Per Provider Order: No       Mobility Bed Mobility                    Transfers                         Balance   Sitting-balance support: Feet supported Sitting balance-[name]: Good Sitting balance - Comments: LB dressing tasks while  seated EOB with Good balance                                   ADL either performed or assessed with clinical judgement   ADL Overall ADL's : Needs assistance/impaired                     Lower Body Dressing: Minimal assistance Lower Body Dressing Details (indicate cue type and reason): doffing/donning gripper socks while utilizing AE (reacher and sock aid) while seated EOB                    Extremity/Trunk Assessment Upper Extremity Assessment Upper Extremity Assessment: Generalized weakness            Vision Patient Visual Report: No change from baseline     Perception     Praxis     Communication Communication Communication: No apparent difficulties   Cognition Arousal: Alert Behavior During Therapy: WFL for tasks assessed/performed Cognition: No apparent impairments                               Following commands: Intact        Cueing   Cueing Techniques: Verbal cues, Gestural cues, Visual cues  Exercises Other Exercises Other Exercises: Education and  training on use of AE (reacher and sock aid) for LB dressing tasks - doffing/donning gripper socks at EOB    Shoulder Instructions       General Comments      Pertinent Vitals/ Pain       Pain Assessment Pain Assessment: No/denies pain  Home Living                                          Prior Functioning/Environment              Frequency  Min 2X/week        Progress Toward Goals  OT Goals(current goals can now be found in the care plan section)  Progress towards OT goals: Progressing toward goals     Plan      Co-evaluation                 AM-PAC OT 6 Clicks Daily Activity     Outcome Measure   Help from another person eating meals?: None Help from another person taking care of personal grooming?: A Little Help from another person toileting, which includes using toliet, bedpan, or urinal?: A Lot Help  from another person bathing (including washing, rinsing, drying)?: A Lot Help from another person to put on and taking off regular upper body clothing?: A Little Help from another person to put on and taking off regular lower body clothing?: A Lot 6 Click Score: 16    End of Session    OT Visit Diagnosis: Other abnormalities of gait and mobility (R26.89);Unsteadiness on feet (R26.81);Muscle weakness (generalized) (M62.81)   Activity Tolerance Patient tolerated treatment well   Patient Left in bed;Other (comment) (Sitting EOB)   Nurse Communication Mobility status        Time: 8574-8546 OT Time Calculation (min): 28 min  Charges: OT General Charges $OT Visit: 1 Visit OT Treatments $Self Care/Home Management : 23-37 mins  Harlene Sharps OTR/L   Harlene LITTIE Sharps 12/21/2023, 3:33 PM

## 2023-12-21 NOTE — Progress Notes (Signed)
 Progress Note    Sherry Price  FMW:[id number] DOB: 03-30-1971  DOA: 12/17/2023 PCP: [name], NP      Brief Narrative:    Medical records reviewed and are as summarized below:  Sherry Price is a 53 y.o. female with medical history significant of Hypertension, Left BRCA s/p chemo /xrt, type II DM, OSA, obesity, who presented to the hospital because of chest pain, shortness of breath and nausea.  CTA chest with a small burden, nonobstructive segmental and subsegmental PE in the right lower lobe.  No findings of right heart strain. No concern of pneumonia, pulmonary edema or pleural effusion.   She was treated with IV heparin  drip and switched to Xarelto .    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  (Class III obesity)    Acute pulmonary embolism without right heart strain: Continue Xarelto . S/p treatment with IV heparin  drip.   Right knee arthritis: Analgesics as needed for pain.  She will need knee replacement but has to lose weight before procedure can be done.  Outpatient follow-up with orthopedic surgeon.   Constipation: Continue MiraLAX .  Add Senokot   Hypokalemia: Improved. Continue potassium repletion.   History of malignant neoplasm of upper outer quadrant of female breast: S/p chemotherapy and radiation therapy.  Outpatient follow-up with oncologist.   Debility, general weakness: Awaiting placement to SNF   Comorbidities include hypertension, type II DM recent A1c 5.8 (on Mounjaro  as an outpatient), hyperlipidemia  Diet Order             Diet Carb Modified Fluid  consistency: Thin; Room service appropriate? Yes  Diet effective now                                  Consultants: None  Procedures: None    Medications:    amLODipine   10 mg Oral Daily   atorvastatin   40 mg Oral Daily   calcium  carbonate  1 tablet Oral BID   hydrochlorothiazide   25 mg Oral Daily   insulin  aspart  0-9 Units Subcutaneous TID WC   labetalol   200 mg Oral BID   melatonin  5 mg Oral QHS   polyethylene glycol  17 g Oral Daily   Rivaroxaban   15 mg Oral BID WC   Followed by   NOREEN ON 01/08/2024] rivaroxaban   20 mg Oral Q supper   venlafaxine  XR  150 mg Oral Q breakfast   Continuous Infusions:   Anti-infectives (From admission, onward)    None              Family Communication/Anticipated D/C date and plan/Code Status   DVT prophylaxis:  Rivaroxaban  (XARELTO ) tablet 15 mg  rivaroxaban  (XARELTO ) tablet 20 mg     Code Status: Full Code  Family Communication: Plan discussed with daughter at the bedside  Disposition Plan: Plan to discharge to SNF   Status is: Inpatient Remains inpatient appropriate because: Awaiting placement to SNF         Subjective:   Interval events noted.  She complains of pain in  the right knee.  No other complaints.  Jettie, daughter, was at the bedside  Objective:    Vitals:   12/20/23 1637 12/20/23 2154 12/21/23 0427 12/21/23 0811  BP: (!) 156/97 130/83 138/86 (!) 146/85  Pulse: 91 95 93 93  Resp: 20 16 18 20   Temp: 97.9 F (36.6 C) 98 F (36.7 C) 98.8 F (37.1 C)   TempSrc:   Oral   SpO2: 96% 100% 100% 100%  Weight:      Height:       No data found.   Intake/Output Summary (Last 24 hours) at 12/21/2023 1047 Last data filed at 12/20/2023 1900 Gross per 24 hour  Intake 360 ml  Output --  Net 360 ml   Filed Weights   12/17/23 1242  Weight: (!) 139.3 kg    Exam:   GEN: NAD SKIN: Warm and dry EYES: No pallor or icterus ENT: MMM CV: RRR PULM: obese ABD: soft, ND,  NT, +BS CNS: AAO x 3, non focal EXT: No edema or tenderness.  Mild tenderness right knee.  No obvious swelling or erythema      Data Reviewed:   I have personally reviewed following labs and imaging studies:  Labs: Labs show the following:   Basic Metabolic Panel: Recent Labs  Lab 12/17/23 1248 12/18/23 0334 12/18/23 0344 12/20/23 0819  NA 138 138  --  136  K 3.7 3.2*  --  3.5  CL 102 104  --  102  CO2 24 23  --  23  GLUCOSE 139* 100*  --  115*  BUN 8 11  --  14  CREATININE 0.70 0.63  --  0.69  CALCIUM  9.3 8.7*  --  9.5  MG  --   --  2.2  --    GFR Estimated Creatinine Clearance: 116.3 mL/min (by C-G formula based on SCr of 0.69 mg/dL). Liver Function Tests: Recent Labs  Lab 12/18/23 0334  AST 22  ALT 16  ALKPHOS 54  BILITOT 0.9  PROT 6.6  ALBUMIN 3.4*   No results for input(s): LIPASE, AMYLASE in the last 168 hours. No results for input(s): AMMONIA in the last 168 hours. Coagulation profile Recent Labs  Lab 12/17/23 1926  INR 1.0    CBC: Recent Labs  Lab 12/17/23 1248 12/18/23 0334 12/20/23 0819  WBC 7.9 11.2* 9.6  NEUTROABS  --   --  6.6  HGB 13.1 12.1 12.7  HCT 39.0 36.3 38.6  MCV 95.8 95.5 96.0  PLT 363 347 373   Cardiac Enzymes: No results for input(s): CKTOTAL, CKMB, CKMBINDEX, TROPONINI in the last 168 hours. BNP (last 3 results) No results for input(s): PROBNP in the last 8760 hours. CBG: Recent Labs  Lab 12/20/23 0815 12/20/23 1216 12/20/23 1637 12/20/23 2157 12/21/23 0810  GLUCAP 125* 130* 77 136* 110*   D-Dimer: No results for input(s): DDIMER in the last 72 hours.  Hgb A1c: No results for input(s): HGBA1C in the last 72 hours. Lipid Profile: No results for input(s): CHOL, HDL, LDLCALC, TRIG, CHOLHDL, LDLDIRECT in the last 72 hours. Thyroid  function studies: No results for input(s): TSH, T4TOTAL, T3FREE, THYROIDAB in the last 72 hours.  Invalid input(s): FREET3 Anemia work  up: No results for input(s): VITAMINB12, FOLATE, FERRITIN, TIBC, IRON, RETICCTPCT in the last 72 hours. Sepsis Labs: Recent Labs  Lab 12/17/23 1248 12/18/23 0334 12/20/23 0819  WBC 7.9 11.2* 9.6    Microbiology Recent Results (from the past 240 hours)  Respiratory (~20 pathogens) panel  by PCR     Status: None   Collection Time: 12/17/23  9:28 PM   Specimen: Nasopharyngeal Swab; Respiratory  Result Value Ref Range Status   Adenovirus NOT DETECTED NOT DETECTED Final   Coronavirus 229E NOT DETECTED NOT DETECTED Final    Comment: (NOTE) The Coronavirus on the Respiratory Panel, DOES NOT test for the novel  Coronavirus (2019 nCoV)    Coronavirus HKU1 NOT DETECTED NOT DETECTED Final   Coronavirus NL63 NOT DETECTED NOT DETECTED Final   Coronavirus OC43 NOT DETECTED NOT DETECTED Final   Metapneumovirus NOT DETECTED NOT DETECTED Final   Rhinovirus / Enterovirus NOT DETECTED NOT DETECTED Final   Influenza A NOT DETECTED NOT DETECTED Final   Influenza B NOT DETECTED NOT DETECTED Final   Parainfluenza Virus 1 NOT DETECTED NOT DETECTED Final   Parainfluenza Virus 2 NOT DETECTED NOT DETECTED Final   Parainfluenza Virus 3 NOT DETECTED NOT DETECTED Final   Parainfluenza Virus 4 NOT DETECTED NOT DETECTED Final   Respiratory Syncytial Virus NOT DETECTED NOT DETECTED Final   Bordetella pertussis NOT DETECTED NOT DETECTED Final   Bordetella Parapertussis NOT DETECTED NOT DETECTED Final   Chlamydophila pneumoniae NOT DETECTED NOT DETECTED Final   Mycoplasma pneumoniae NOT DETECTED NOT DETECTED Final    Comment: Performed at [facility], [address] [place], [place] [address]    Procedures and diagnostic studies:  No results found.             LOS: 0 days   Darcell Sabino  [facility]   Pager on [email or web address]. If 7PM-7AM, please contact night-coverage at [email or web address]     12/21/2023, 10:47 AM

## 2023-12-21 NOTE — TOC Progression Note (Signed)
 Transition of Care [facility]) - Progression Note    Patient Details  Name: Sherry Price MRN: [id number] Date of Birth: 03-27-1971  [facility] CM/SW Contact  [place], [place] Phone Number: 12/21/2023, 2:43 PM  Clinical Narrative:     CSW met with patient at bedside. Bed offers discussed with patient. CSW advised that a facility in [place] was faxed documentation over the weekend but a response was not received. CSW advised that patient has three bed offers in [facility] and she would need choose. CSW advised that patient can arrange for transfer once she gets to the facility.   Patient expressed an understanding. She contacted her daughter to discuss options. Patient agreed to accept offer at [facility].   Auth started for [facility].   3:19pm: CSW received a secure chat advising patient has questions. CSW spoke with patient. Patient advised that she wanted to know how she get personal care items and clothes when she transitions to [facility]. CSW advised that her family can assist with providing those items. Patient stated that her daughter lives in [place], [place]. CSW stated that the facility has hygiene supplies and donated clothing.   Patient is anxious about discharge and she advised that she is just trying to be prepared. CSW advised that discharge plans are not finalized yet; they are in process.                     Expected Discharge Plan and Services         Expected Discharge Date: 12/21/23                                     Social Drivers of Health (SDOH) Interventions SDOH Screenings   Food Insecurity: No Food Insecurity (12/18/2023)  Housing: High Risk (12/18/2023)  Transportation Needs: No Transportation Needs (12/18/2023)  Utilities: At Risk (12/18/2023)  Alcohol Screen: Low Risk  (03/11/2023)  Depression (PHQ2-9): High Risk (11/05/2023)  Financial Resource Strain: Low Risk  (03/11/2023)  Physical Activity:  Inactive (03/11/2023)  Social Connections: Moderately Isolated (03/11/2023)  Stress: Stress Concern Present (03/11/2023)  Tobacco Use: Medium Risk (12/15/2023)   Received from [facility]  Health Literacy: Inadequate Health Literacy (03/11/2023)    Readmission Risk Interventions     No data to display

## 2023-12-21 NOTE — Telephone Encounter (Signed)
 P rescheduled appt for todat. She stated she would like you to look at her notes and wants to know some suggestions you may have of what she should do when she transitions to where she has to go next

## 2023-12-22 DIAGNOSIS — I2699 Other pulmonary embolism without acute cor pulmonale: Secondary | ICD-10-CM | POA: Diagnosis not present

## 2023-12-22 LAB — GLUCOSE, CAPILLARY
Glucose-Capillary: 116 mg/dL — ABNORMAL HIGH (ref 70–99)
Glucose-Capillary: 102 mg/dL — ABNORMAL HIGH (ref 70–99)
Glucose-Capillary: 131 mg/dL — ABNORMAL HIGH (ref 70–99)
Glucose-Capillary: 120 mg/dL — ABNORMAL HIGH (ref 70–99)

## 2023-12-22 MED ORDER — BISACODYL 10 MG RE SUPP
10.0000 mg | Freq: Once | RECTAL | Status: AC
  Administered 2023-12-22: 10 mg via RECTAL
  Filled 2023-12-22: qty 1

## 2023-12-22 MED ORDER — DICLOFENAC SODIUM 1 % EX GEL: 2.0000 g | Freq: Three times a day (TID) | CUTANEOUS | Status: DC | PRN

## 2023-12-22 NOTE — Plan of Care (Signed)
  Problem: Coping: Goal: Ability to adjust to condition or change in health will improve Outcome: Progressing   Problem: Fluid Volume: Goal: Ability to maintain a balanced intake and output will improve Outcome: Progressing   Problem: Health Behavior/Discharge Planning: Goal: Ability to identify and utilize available resources and services will improve Outcome: Progressing Goal: Ability to manage health-related needs will improve Outcome: Progressing

## 2023-12-22 NOTE — Progress Notes (Signed)
 Patient concerned about not having clothing available for d/c to [facility], as her support system (daughter) lives in [place]. Patient provided with a set of clothing for d/c.

## 2023-12-22 NOTE — Progress Notes (Signed)
 Attempted to meet with patient at bedside, per request. Patient sleeping soundly. Bedside nurse encouraged to let nurse navigator know when patient was available to speak with her.

## 2023-12-22 NOTE — Progress Notes (Signed)
 Progress Note    Sherry Price  FMW:[id number] DOB: 12-05-1970  DOA: 12/17/2023 PCP: [name], NP      Brief Narrative:    Medical records reviewed and are as summarized below:  Sherry Price is a 53 y.o. female with medical history significant of Hypertension, Left BRCA s/p chemo /xrt, type II DM, OSA, obesity, who presented to the hospital because of chest pain, shortness of breath and nausea.  CTA chest with a small burden, nonobstructive segmental and subsegmental PE in the right lower lobe.  No findings of right heart strain. No concern of pneumonia, pulmonary edema or pleural effusion.   She was treated with IV heparin  drip and switched to Xarelto .    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  (Class III obesity)    Acute pulmonary embolism without right heart strain: Continue Xarelto . S/p treatment with IV heparin  drip.   Right knee arthritis: Analgesics as needed for pain.  She requested Voltaren  gel for right knee pain.  She will need knee replacement but has to lose weight before procedure can be done.  Outpatient follow-up with orthopedic surgeon.   Constipation: Continue MiraLAX  and Senokot.  Order Dulcolax suppository.   Hypokalemia: Improved. Continue potassium repletion.   History of malignant neoplasm of upper outer quadrant of female breast: S/p chemotherapy and radiation therapy.  Outpatient follow-up with oncologist.   Debility, general weakness: Awaiting placement to SNF   Comorbidities include hypertension, type II DM recent  A1c 5.8 (on Mounjaro  as an outpatient), hyperlipidemia   Awaiting placement to SNF  Diet Order             Diet - low sodium heart healthy           Diet Carb Modified           Diet Carb Modified Fluid consistency: Thin; Room service appropriate? Yes  Diet effective now                                  Consultants: None  Procedures: None    Medications:    amLODipine   10 mg Oral Daily   atorvastatin   40 mg Oral Daily   bisacodyl   10 mg Rectal Once   calcium  carbonate  1 tablet Oral BID   hydrochlorothiazide   25 mg Oral Daily   insulin  aspart  0-9 Units Subcutaneous TID WC   labetalol   200 mg Oral BID   melatonin  5 mg Oral QHS   polyethylene glycol  17 g Oral Daily   Rivaroxaban   15 mg Oral BID WC   Followed by   NOREEN ON 01/08/2024] rivaroxaban   20 mg Oral Q supper   senna-docusate  1 tablet Oral QHS   venlafaxine  XR  150 mg Oral Q breakfast   Continuous Infusions:   Anti-infectives (From admission, onward)    None              Family Communication/Anticipated D/C date and plan/Code Status   DVT prophylaxis:  Rivaroxaban  (  XARELTO ) tablet 15 mg  rivaroxaban  (XARELTO ) tablet 20 mg     Code Status: Full Code  Family Communication: None Disposition Plan: Plan to discharge to SNF   Status is: Inpatient Remains inpatient appropriate because: Awaiting placement to SNF         Subjective:   No acute events noted.  She complains of pain in her right knee and constipation.  She is willing to try Dulcolax suppository  Objective:    Vitals:   12/21/23 1642 12/21/23 1956 12/22/23 0537 12/22/23 0805  BP: 134/89 (!) 144/87 131/83 131/76  Pulse: 90 94 91 94  Resp: 19 17 15 20   Temp: 97.8 F (36.6 C) 98.2 F (36.8 C) 97.8 F (36.6 C) 98 F (36.7 C)  TempSrc:  Oral Oral Oral  SpO2: 100% 100% 100% 98%  Weight:      Height:       No data found.   Intake/Output Summary (Last 24 hours) at 12/22/2023 1522 Last  data filed at 12/22/2023 1411 Gross per 24 hour  Intake 600 ml  Output --  Net 600 ml   Filed Weights   12/17/23 1242  Weight: (!) 139.3 kg    Exam:   GEN: NAD SKIN: Warm and dry  EYES: No pallor or icterus ENT: MMM CV: RRR PULM: CTA B ABD: soft, obese, NT, +BS CNS: AAO x 3, non focal EXT: Mild right knee tenderness       Data Reviewed:   I have personally reviewed following labs and imaging studies:  Labs: Labs show the following:   Basic Metabolic Panel: Recent Labs  Lab 12/17/23 1248 12/18/23 0334 12/18/23 0344 12/20/23 0819  NA 138 138  --  136  K 3.7 3.2*  --  3.5  CL 102 104  --  102  CO2 24 23  --  23  GLUCOSE 139* 100*  --  115*  BUN 8 11  --  14  CREATININE 0.70 0.63  --  0.69  CALCIUM  9.3 8.7*  --  9.5  MG  --   --  2.2  --    GFR Estimated Creatinine Clearance: 116.3 mL/min (by C-G formula based on SCr of 0.69 mg/dL). Liver Function Tests: Recent Labs  Lab 12/18/23 0334  AST 22  ALT 16  ALKPHOS 54  BILITOT 0.9  PROT 6.6  ALBUMIN 3.4*   No results for input(s): LIPASE, AMYLASE in the last 168 hours. No results for input(s): AMMONIA in the last 168 hours. Coagulation profile Recent Labs  Lab 12/17/23 1926  INR 1.0    CBC: Recent Labs  Lab 12/17/23 1248 12/18/23 0334 12/20/23 0819  WBC 7.9 11.2* 9.6  NEUTROABS  --   --  6.6  HGB 13.1 12.1 12.7  HCT 39.0 36.3 38.6  MCV 95.8 95.5 96.0  PLT 363 347 373   Cardiac Enzymes: No results for input(s): CKTOTAL, CKMB, CKMBINDEX, TROPONINI in the last 168 hours. BNP (last 3 results) No results for input(s): PROBNP in the last 8760 hours. CBG: Recent Labs  Lab 12/21/23 0810 12/21/23 1200 12/21/23 1640 12/22/23 0809 12/22/23 1210  GLUCAP 110* 123* 106* 116* 102*   D-Dimer: No results for input(s): DDIMER in the last 72 hours.  Hgb A1c: No results for input(s): HGBA1C in the last 72 hours. Lipid Profile: No results for input(s): CHOL, HDL,  LDLCALC, TRIG, CHOLHDL, LDLDIRECT in the last 72 hours. Thyroid  function studies: No results for input(s): TSH, T4TOTAL, T3FREE, THYROIDAB in the last 72  hours.  Invalid input(s): FREET3 Anemia work up: No results for input(s): VITAMINB12, FOLATE, FERRITIN, TIBC, IRON, RETICCTPCT in the last 72 hours. Sepsis Labs: Recent Labs  Lab 12/17/23 1248 12/18/23 0334 12/20/23 0819  WBC 7.9 11.2* 9.6    Microbiology Recent Results (from the past 240 hours)  Respiratory (~20 pathogens) panel by PCR     Status: None   Collection Time: 12/17/23  9:28 PM   Specimen: Nasopharyngeal Swab; Respiratory  Result Value Ref Range Status   Adenovirus NOT DETECTED NOT DETECTED Final   Coronavirus 229E NOT DETECTED NOT DETECTED Final    Comment: (NOTE) The Coronavirus on the Respiratory Panel, DOES NOT test for the novel  Coronavirus (2019 nCoV)    Coronavirus HKU1 NOT DETECTED NOT DETECTED Final   Coronavirus NL63 NOT DETECTED NOT DETECTED Final   Coronavirus OC43 NOT DETECTED NOT DETECTED Final   Metapneumovirus NOT DETECTED NOT DETECTED Final   Rhinovirus / Enterovirus NOT DETECTED NOT DETECTED Final   Influenza A NOT DETECTED NOT DETECTED Final   Influenza B NOT DETECTED NOT DETECTED Final   Parainfluenza Virus 1 NOT DETECTED NOT DETECTED Final   Parainfluenza Virus 2 NOT DETECTED NOT DETECTED Final   Parainfluenza Virus 3 NOT DETECTED NOT DETECTED Final   Parainfluenza Virus 4 NOT DETECTED NOT DETECTED Final   Respiratory Syncytial Virus NOT DETECTED NOT DETECTED Final   Bordetella pertussis NOT DETECTED NOT DETECTED Final   Bordetella Parapertussis NOT DETECTED NOT DETECTED Final   Chlamydophila pneumoniae NOT DETECTED NOT DETECTED Final   Mycoplasma pneumoniae NOT DETECTED NOT DETECTED Final    Comment: Performed at [facility], [address] [place], [place] [address]    Procedures and diagnostic studies:  No results  found.             LOS: 1 day   Sherry Price  [facility]   Pager on [email or web address]. If 7PM-7AM, please contact night-coverage at [email or web address]     12/22/2023, 3:22 PM

## 2023-12-22 NOTE — TOC Progression Note (Addendum)
 Transition of Care [facility]) - Progression Note    Patient Details  Name: Alexah Kivett MRN: [id number] Date of Birth: 1971-02-20  Transition of Care [facility]) CM/SW Contact  Dalia GORMAN Fuse, RN Phone Number: 12/22/2023, 3:27 PM  Clinical Narrative:     The patient has insurance auth for [facility] Approved PlanAuthID: [id number] Dates: 8/18-8/20/2025 Next review date: 12/23/2023   Auth called to Tammy at [facility], they can accept the patient tomorrow.                    Expected Discharge Plan and Services         Expected Discharge Date: 12/21/23                                     Social Drivers of Health (SDOH) Interventions SDOH Screenings   Food Insecurity: No Food Insecurity (12/18/2023)  Housing: High Risk (12/18/2023)  Transportation Needs: No Transportation Needs (12/18/2023)  Utilities: At Risk (12/18/2023)  Alcohol Screen: Low Risk  (03/11/2023)  Depression (PHQ2-9): High Risk (11/05/2023)  Financial Resource Strain: Low Risk  (03/11/2023)  Physical Activity: Inactive (03/11/2023)  Social Connections: Moderately Isolated (03/11/2023)  Stress: Stress Concern Present (03/11/2023)  Tobacco Use: Medium Risk (12/15/2023)   Received from [facility]  Health Literacy: Inadequate Health Literacy (03/11/2023)    Readmission Risk Interventions     No data to display

## 2023-12-22 NOTE — Progress Notes (Signed)
 Physical Therapy Treatment Patient Details Name: Sherry Price MRN: [id number] DOB: [birth year] Today's Date: 12/22/2023   History of Present Illness 53yoF who comes [facility] after acute onset LUE and left CP, found to have acute PE. Pt is a household AMB at baseline, 4WW use, has really struggled with mobility over the years due to chronic bilat knee DJD.    PT Comments  Pt received upright in bed agreeable to PT services. Reports pain currently 6/10 NPS in B knees. Able to mobilize in bed mod-I. Pt demo's her own STS technique with bed elevated at CGA. PT returned to sitting and author reviewing safer STS technique and how it would reduce falls risk. Pt able to stand to RW with bed elevated CGA with correct hand placement and perform SPT to recliner. Albeit pt in excruciating pain, heavy BUE support, laborious steps to sit in recliner with poor eccentric control and increased WOB. SPO2 and HR appropriate for effort. Reviewed seated LE therex to assist in arthritic knee pain then returned to STS modA+1 for STS from recliner and SPT back to EOB. Mod-I returned to sit to supine. Pt left with all needs. D/c recs remain appropriate.    If plan is discharge home, recommend the following: A lot of help with walking and/or transfers;A lot of help with bathing/dressing/bathroom   Can travel by private vehicle     No  Equipment Recommendations  Rolling walker (2 wheels);BSC/3in1;Wheelchair (measurements PT);Wheelchair cushion (measurements PT)    Recommendations for Other Services       Precautions / Restrictions Precautions Precautions: Fall Recall of Precautions/Restrictions: Intact Restrictions Weight Bearing Restrictions Per Provider Order: No     Mobility  Bed Mobility Overal bed mobility: Modified Independent               Patient Response: Cooperative  Transfers Overall transfer level: Needs assistance Equipment used: Rolling walker (2 wheels) Transfers: Sit to/from  Stand, Bed to chair/wheelchair/BSC Sit to Stand: Contact guard assist, Mod assist, From elevated surface   Step pivot transfers: Contact guard assist       General transfer comment: MinA from elevated hospital bed. ModA+1 from recliner.    Ambulation/Gait                   Stairs             Wheelchair Mobility     Tilt Bed Tilt Bed Patient Response: Cooperative  Modified Rankin (Stroke Patients Only)       Balance Overall balance assessment: Needs assistance Sitting-balance support: Feet supported Sitting balance-[name]: Good     Standing balance support: Bilateral upper extremity supported, During functional activity, Reliant on assistive device for balance Standing balance-[name]: Poor Standing balance comment: Reliant on RW                            Communication Communication Communication: No apparent difficulties  Cognition Arousal: Alert Behavior During Therapy: WFL for tasks assessed/performed   PT - Cognitive impairments: No apparent impairments                         Following commands: Intact      Cueing Cueing Techniques: Verbal cues, Gestural cues, Visual cues  Exercises General Exercises - Lower Extremity Quad Sets: AROM, Both, 5 reps, Supine Long Arc Quad: AROM, Strengthening, Right, Left, 5 reps, Seated Heel Slides: AROM, Strengthening, Right, Left, 5  reps, Supine Other Exercises Other Exercises: time spent discussing safer STS techniques to reduce falls risk    General Comments        Pertinent Vitals/Pain Pain Assessment Pain Assessment: No/denies pain Faces Pain Scale: Hurts even more Pain Location: B knees Pain Descriptors / Indicators: Aching, Grimacing, Discomfort Pain Intervention(s): Limited activity within patient's tolerance, Repositioned    Home Living                          Prior Function            PT Goals (current goals can now be found in the care  plan section) Acute Rehab PT Goals Patient Stated Goal: regain strength, improve ability to return to safe householdAMB PT Goal Formulation: With patient Time For Goal Achievement: 01/01/24 Potential to Achieve Goals: Good Progress towards PT goals: Progressing toward goals    Frequency    Min 2X/week      PT Plan      Co-evaluation              AM-PAC PT 6 Clicks Mobility   Outcome Measure  Help needed turning from your back to your side while in a flat bed without using bedrails?: None Help needed moving from lying on your back to sitting on the side of a flat bed without using bedrails?: None Help needed moving to and from a bed to a chair (including a wheelchair)?: A Lot Help needed standing up from a chair using your arms (e.g., wheelchair or bedside chair)?: A Lot Help needed to walk in hospital room?: A Lot Help needed climbing 3-5 steps with a railing? : Total 6 Click Score: 15    End of Session Equipment Utilized During Treatment: Gait belt Activity Tolerance: Patient tolerated treatment well;Patient limited by pain Patient left: in bed;with call bell/phone within reach;with bed alarm set   PT Visit Diagnosis: Difficulty in walking, not elsewhere classified (R26.2);Other abnormalities of gait and mobility (R26.89);Muscle weakness (generalized) (M62.81);Dizziness and giddiness (R42)     Time: 8552-8485 PT Time Calculation (min) (ACUTE ONLY): 27 min  Charges:    $Therapeutic Activity: 23-37 mins PT General Charges $$ ACUTE PT VISIT: 1 Visit                     Dorina HERO. Fairly IV, PT, DPT Physical Therapist- [facility] 12/22/2023, 3:33 PM

## 2023-12-23 ENCOUNTER — Encounter: Payer: Self-pay | Admitting: Internal Medicine

## 2023-12-23 ENCOUNTER — Inpatient Hospital Stay

## 2023-12-23 DIAGNOSIS — Z736 Limitation of activities due to disability: Secondary | ICD-10-CM | POA: Diagnosis not present

## 2023-12-23 DIAGNOSIS — R2681 Unsteadiness on feet: Secondary | ICD-10-CM | POA: Diagnosis not present

## 2023-12-23 DIAGNOSIS — M62838 Other muscle spasm: Secondary | ICD-10-CM | POA: Diagnosis not present

## 2023-12-23 DIAGNOSIS — M170 Bilateral primary osteoarthritis of knee: Secondary | ICD-10-CM | POA: Diagnosis not present

## 2023-12-23 DIAGNOSIS — I10 Essential (primary) hypertension: Secondary | ICD-10-CM | POA: Diagnosis not present

## 2023-12-23 DIAGNOSIS — M1990 Unspecified osteoarthritis, unspecified site: Secondary | ICD-10-CM | POA: Diagnosis not present

## 2023-12-23 DIAGNOSIS — M6259 Muscle wasting and atrophy, not elsewhere classified, multiple sites: Secondary | ICD-10-CM | POA: Diagnosis not present

## 2023-12-23 DIAGNOSIS — C50419 Malignant neoplasm of upper-outer quadrant of unspecified female breast: Secondary | ICD-10-CM | POA: Diagnosis not present

## 2023-12-23 DIAGNOSIS — R69 Illness, unspecified: Secondary | ICD-10-CM | POA: Diagnosis not present

## 2023-12-23 DIAGNOSIS — I2699 Other pulmonary embolism without acute cor pulmonale: Secondary | ICD-10-CM | POA: Diagnosis not present

## 2023-12-23 DIAGNOSIS — G4700 Insomnia, unspecified: Secondary | ICD-10-CM | POA: Diagnosis not present

## 2023-12-23 DIAGNOSIS — E569 Vitamin deficiency, unspecified: Secondary | ICD-10-CM | POA: Diagnosis not present

## 2023-12-23 DIAGNOSIS — Z743 Need for continuous supervision: Secondary | ICD-10-CM | POA: Diagnosis not present

## 2023-12-23 DIAGNOSIS — E785 Hyperlipidemia, unspecified: Secondary | ICD-10-CM | POA: Diagnosis not present

## 2023-12-23 DIAGNOSIS — M25512 Pain in left shoulder: Secondary | ICD-10-CM | POA: Diagnosis not present

## 2023-12-23 DIAGNOSIS — E118 Type 2 diabetes mellitus with unspecified complications: Secondary | ICD-10-CM | POA: Diagnosis not present

## 2023-12-23 DIAGNOSIS — Z86711 Personal history of pulmonary embolism: Secondary | ICD-10-CM | POA: Diagnosis not present

## 2023-12-23 LAB — GLUCOSE, CAPILLARY
Glucose-Capillary: 132 mg/dL — ABNORMAL HIGH (ref 70–99)
Glucose-Capillary: 118 mg/dL — ABNORMAL HIGH (ref 70–99)

## 2023-12-23 MED ORDER — RIVAROXABAN 20 MG PO TABS: 20.0000 mg | ORAL_TABLET | Freq: Every day | ORAL | 0 refills | Status: AC

## 2023-12-23 MED ORDER — DICLOFENAC SODIUM 1 % EX GEL: 2.0000 g | Freq: Three times a day (TID) | CUTANEOUS | Status: AC | PRN

## 2023-12-23 MED ORDER — RIVAROXABAN 15 MG PO TABS: 15.0000 mg | ORAL_TABLET | Freq: Two times a day (BID) | ORAL | 0 refills | Status: AC

## 2023-12-23 MED ORDER — OXYCODONE HCL 5 MG PO TABS
5.0000 mg | ORAL_TABLET | Freq: Four times a day (QID) | ORAL | Status: DC | PRN
  Administered 2023-12-23: 5 mg via ORAL
  Filled 2023-12-23: qty 1

## 2023-12-23 MED ORDER — BUPRENORPHINE 7.5 MCG/HR TD PTWK: 1.0000 | MEDICATED_PATCH | TRANSDERMAL | 0 refills | Status: AC

## 2023-12-23 MED ORDER — ALPRAZOLAM 0.5 MG PO TABS: 0.5000 mg | ORAL_TABLET | Freq: Every day | ORAL | 0 refills | Status: AC | PRN

## 2023-12-23 MED ORDER — OXYCODONE-ACETAMINOPHEN 5-325 MG PO TABS: 1.0000 | ORAL_TABLET | Freq: Three times a day (TID) | ORAL | 0 refills | Status: AC | PRN

## 2023-12-23 NOTE — Progress Notes (Signed)
 Met with patient at bedside, per request. Patient states she will be discharging to [facility] today for SNF. Endorses feeling anxious.   Patient provided with application packet for [facility]. Patient aware packet will need to be submitted directly to [facility].

## 2023-12-23 NOTE — Progress Notes (Signed)
 Occupational Therapy Treatment Patient Details Name: Sherry Price MRN: [id number] DOB: 01/09/1971 Today's Date: 12/23/2023   History of present illness 53yoF who comes to [facility] after acute onset LUE and left CP, found to have acute PE. Pt is a household AMB at baseline, 4WW use, has really struggled with mobility over the years due to chronic bilat knee DJD.   OT comments  Pt seen for OT treatment on this date. Upon arrival to room pt supine in bed, agreeable to tx. Pt requires decreased physical assistance with seated components of ADLs, Min A for sit to stand transfers at [facility] for standing components of ADLs, and close CGA for stand pivot transfer with verbal and visual cuing for safe hand/foot placement. Pt making good progress toward goals, will continue to follow POC. Discharge recommendation remains appropriate.        If plan is discharge home, recommend the following:  A little help with walking and/or transfers;A lot of help with bathing/dressing/bathroom;Assistance with cooking/housework;Assist for transportation;Help with stairs or ramp for entrance   Equipment Recommendations  Other (comment) (defer to next venue of care)    Recommendations for Other Services      Precautions / Restrictions Precautions Precautions: Fall Recall of Precautions/Restrictions: Intact Restrictions Weight Bearing Restrictions Per Provider Order: No       Mobility Bed Mobility Overal bed mobility: Modified Independent             General bed mobility comments: to exit bed with increased effort using bed rail    Transfers Overall transfer level: Needs assistance Equipment used: Rolling walker (2 wheels) Transfers: Sit to/from Stand, Bed to chair/wheelchair/BSC Sit to Stand: Contact guard assist, From elevated surface, Min assist   Squat pivot transfers: Contact guard assist       General transfer comment: MinA from elevated hospital bed and from [facility] for squat/stand pivot  transfers - education on safety awareness for fall risk management     Balance Overall balance assessment: Needs assistance Sitting-balance support: Feet supported Sitting balance-[name]: Good Sitting balance - Comments: LB dressing tasks while seated EOB with Good balance   Standing balance support: Bilateral upper extremity supported, During functional activity, Reliant on assistive device for balance Standing balance-[name]: Poor Standing balance comment: heavily Reliant on RW during standing components of ADLs                           ADL either performed or assessed with clinical judgement   ADL Overall ADL's : Needs assistance/impaired         Upper Body Bathing: Set up;Sitting   Lower Body Bathing: Contact guard assist;Minimal assistance;Sit to/from stand Lower Body Bathing Details (indicate cue type and reason): SBA for seated components and Min A for sit to stand transfers from elevated surface to complete standing components Upper Body Dressing : Set up;Sitting   Lower Body Dressing: Minimal assistance;Sit to/from stand Lower Body Dressing Details (indicate cue type and reason): SBA for seated components to don shorts and Min A for sit to stand transfers from elevated surface to complete standing components Toilet Transfer: Contact guard assist;Minimal assistance;Rolling walker (2 wheels);BSC/3in1   Toileting- Clothing Manipulation and Hygiene: Contact guard assist;Sit to/from stand              Extremity/Trunk Assessment Upper Extremity Assessment Upper Extremity Assessment: Generalized weakness            Vision Patient Visual Report: No change from  baseline     Perception     Praxis     Communication Communication Communication: No apparent difficulties   Cognition Arousal: Alert Behavior During Therapy: WFL for tasks assessed/performed Cognition: No apparent impairments                                Following commands: Intact        Cueing   Cueing Techniques: Verbal cues, Gestural cues, Visual cues  Exercises Other Exercises Other Exercises: Education on safety with hand/foot placement for BSC transfers and for standing components of ADLs    Shoulder Instructions       General Comments      Pertinent Vitals/ Pain       Pain Assessment Pain Assessment: Faces Faces Pain Scale: Hurts even more Pain Location: B knees Pain Descriptors / Indicators: Aching, Grimacing, Discomfort Pain Intervention(s): Limited activity within patient's tolerance, Monitored during session, Repositioned  Home Living                                          Prior Functioning/Environment              Frequency  Min 2X/week        Progress Toward Goals  OT Goals(current goals can now be found in the care plan section)  Progress towards OT goals: Progressing toward goals     Plan      Co-evaluation                 AM-PAC OT 6 Clicks Daily Activity     Outcome Measure   Help from another person eating meals?: None Help from another person taking care of personal grooming?: None Help from another person toileting, which includes using toliet, bedpan, or urinal?: A Little Help from another person bathing (including washing, rinsing, drying)?: A Little Help from another person to put on and taking off regular upper body clothing?: A Little Help from another person to put on and taking off regular lower body clothing?: A Little 6 Click Score: 20    End of Session Equipment Utilized During Treatment: Rolling walker (2 wheels)  OT Visit Diagnosis: Other abnormalities of gait and mobility (R26.89);Unsteadiness on feet (R26.81);Muscle weakness (generalized) (M62.81)   Activity Tolerance Patient tolerated treatment well   Patient Left in bed;with nursing/sitter in room (Nurse Tech present and attending at hand off)   Nurse Communication Mobility  status        Time: 9269-9189 OT Time Calculation (min): 40 min  Charges: OT General Charges $OT Visit: 1 Visit OT Treatments $Self Care/Home Management : 38-52 mins  Harlene Sharps OTR/L   Harlene LITTIE Sharps 12/23/2023, 8:21 AM

## 2023-12-23 NOTE — TOC Transition Note (Addendum)
 Transition of Care [facility]) - Discharge Note   Patient Details  Name: Sherry Price MRN: [id number] Date of Birth: 1970/05/15  Transition [facility] Care [facility]) CM/SW Contact:  Dalia GORMAN Fuse, RN Phone Number: 12/23/2023, 1:54 PM   Clinical Narrative:     Patient is medically clear to discharge to [facility] for STR. [facility] to transport. The patient is agreeable with the discharge plan.  TOC received a request from Tammy at [facility] asking the patient to bring their own [place]. The patient has reached out to her sister to ask her to bring it from home.  Final next level of care: Skilled Nursing Facility Barriers to Discharge: Barriers Resolved   Patient Goals and CMS Choice     Choice offered to / list presented to : Patient      Discharge Placement              Patient chooses bed at: [facility] [place] Patient to be transferred to facility by: [facility]   Patient and family notified of of transfer: 12/23/23  Discharge Plan and Services Additional resources added to the After Visit Summary for                                       Social Drivers of Health (SDOH) Interventions SDOH Screenings   Food Insecurity: No Food Insecurity (12/18/2023)  Housing: High Risk (12/18/2023)  Transportation Needs: No Transportation Needs (12/18/2023)  Utilities: At Risk (12/18/2023)  Alcohol Screen: Low Risk  (03/11/2023)  Depression (PHQ2-9): High Risk (11/05/2023)  Financial Resource Strain: Low Risk  (03/11/2023)  Physical Activity: Inactive (03/11/2023)  Social Connections: Moderately Isolated (03/11/2023)  Stress: Stress Concern Present (03/11/2023)  Tobacco Use: Medium Risk (12/23/2023)  Health Literacy: Inadequate Health Literacy (03/11/2023)     Readmission Risk Interventions     No data to display

## 2023-12-23 NOTE — Progress Notes (Signed)
 D/C order in, AVS printed and paper scripts signed and placed in transport folder.  Report called to [facility].  Awaiting transport to take patient to facility.

## 2023-12-23 NOTE — Discharge Summary (Signed)
 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 [facility] Course: Sherry Price is a 53 y.o. female with medical history significant of Hypertension, Left BRCA s/p chemo /xrt, type II DM, OSA, obesity, who presented to the hospital because of chest pain, shortness of breath and nausea.   CTA chest with a small burden, nonobstructive segmental and subsegmental PE in the right lower lobe.  No findings of right heart strain. No concern of pneumonia, pulmonary edema or pleural effusion.   She was treated with IV heparin  drip and switched to Xarelto . Hospital course as below  Assessment and Plan: Acute pulmonary embolism without right heart strain:  Echo was normal, US  LE neg for DVT b/l Continue Xarelto . S/p treatment with IV heparin  drip   Right knee arthritis: Analgesics as needed for pain.  She requested Voltaren  gel for right knee pain.  She will need knee replacement but has to lose weight before procedure can be done.  Outpatient follow-up with orthopedic surgeon.  Chronic pain - On Buprenorphine , Percocet   Constipation: Continue MiraLAX  and Senokot   Hypokalemia:resolved. Follow up  outpatient   History of malignant neoplasm of upper outer quadrant of female breast: S/p chemotherapy and radiation therapy.  Outpatient follow-up with oncologist.  Morbid Obesity - On Mounjaro      Debility, general weakness: Discharge to SNF   Comorbidities include hypertension, type II DM recent A1c 5.8 (on Mounjaro  as an outpatient), hyperlipidemia   Consultants: None Procedures performed: None  Disposition: Skilled nursing facility Diet recommendation:  Discharge Diet Orders (From admission, onward)     Start     Ordered   12/23/23 0000  Diet - low sodium heart healthy        12/23/23 1335   12/21/23 0000  Diet - low sodium heart healthy        12/21/23 1356   12/21/23 0000  Diet Carb Modified        12/21/23 1356            DISCHARGE MEDICATION: Allergies as of 12/23/2023       Reactions   Lisinopril    Other Reaction(s): cough (and knee pain per patient)   Losartan Hives        Medication List     STOP taking these medications    GOODY HEADACHE PO   metroNIDAZOLE  500 MG tablet Commonly known as: FLAGYL    mupirocin  ointment 2 % Commonly known as: BACTROBAN    prazosin  1 MG capsule Commonly known as: MINIPRESS    Suzetrigine  50 MG Tabs       TAKE these medications    ALPRAZolam  0.5 MG tablet Commonly known as: XANAX  TAKE 1 TABLET(0.5 MG) BY MOUTH DAILY  AS NEEDED FOR ANXIETY   amLODipine  10 MG tablet Commonly known as: NORVASC  TAKE 1 TABLET(10 MG) BY MOUTH DAILY   atorvastatin  40 MG tablet Commonly known as: LIPITOR Take 40 mg by mouth daily.   Blood Glucose Monitoring Suppl Devi 1 each by Does not apply route in the morning, at noon, and at bedtime. May substitute to any manufacturer covered by patient's insurance.   buprenorphine  7.5 MCG/HR Commonly known as: Butrans  Place 1 patch onto the skin once a week.   calcium  carbonate 500 MG chewable tablet Commonly known as: Tums Chew 1 tablet (200 mg of elemental calcium  total) by  mouth 2 (two) times daily.   diclofenac  Sodium 1 % Gel Commonly known as: VOLTAREN  Apply 2 g topically 3 (three) times daily as needed (right knee pain).   hydrochlorothiazide  25 MG tablet Commonly known as: HYDRODIURIL  TAKE 1/2 TABLET(12.5 MG) BY MOUTH DAILY   labetalol  200 MG tablet Commonly known as: NORMODYNE  TAKE 1 TABLET(200 MG) BY MOUTH TWICE DAILY   ondansetron  4 MG disintegrating tablet Commonly known as: ZOFRAN -ODT DISSOLVE 1 TO 2 TABLETS ON THE TONGUE EVERY 8 HOURS AS NEEDED FOR NAUSEA OR VOMITING   oxyCODONE -acetaminophen  5-325 MG tablet Commonly known as: Percocet Take 1 tablet by mouth every 8 (eight) hours as needed for up to 5 days for severe pain (pain score 7-10).   Rivaroxaban  15 MG Tabs tablet Commonly known as: XARELTO  Take 1 tablet (15 mg total) by mouth 2 (two) times daily with a meal for 16 days.   rivaroxaban  20 MG Tabs tablet Commonly known as: XARELTO  Take 1 tablet (20 mg total) by mouth daily with supper. Start taking on: January 08, 2024   tirzepatide  15 MG/0.5ML Pen Commonly known as: MOUNJARO  Inject 15 mg into the skin once a week.   venlafaxine  XR 150 MG 24 hr capsule Commonly known as: EFFEXOR -XR Take 1 capsule (150 mg total) by mouth daily with breakfast.        Follow-up Information     [name], NP Follow up.   Specialty: Nurse Practitioner Why: hospital follow up Contact information: [address] [place] [address] [phone]                Discharge Exam: Fredricka Weights   12/17/23 1242  Weight: (!) 139.3 kg   GEN: NAD SKIN: Warm and dry  EYES: No pallor or icterus ENT: MMM CV: RRR PULM: CTA B ABD: soft, obese, NT, +BS CNS: AAO x 3, non focal EXT: Mild right knee tenderness  Condition at discharge: fair  The results of significant diagnostics from this hospitalization (including imaging, microbiology, ancillary and laboratory) are listed below for reference.   Imaging  Studies: ECHOCARDIOGRAM COMPLETE Result Date: 12/18/2023    ECHOCARDIOGRAM REPORT   Patient Name:   Sherry Price Date of Exam: 12/18/2023 Medical Rec #:  [id number]           Height:       67.0 in Accession #:    [id number]          Weight:       307.0 lb Date of Birth:  July 26, 1970           BSA:          2.426 m Patient Age:    53 years            BP:           161/87 mmHg Patient Gender: F  HR:           102 bpm. Exam Location:  [facility] Procedure: 2D Echo, Cardiac Doppler, Color Doppler and Intracardiac            Opacification Agent (Both Spectral and Color Flow Doppler were            utilized during procedure). Indications:     Abnormal ECG  History:         Patient has no prior history of Echocardiogram examinations.                  Signs/Symptoms:Shortness of Breath; Risk Factors:Hypertension,                  Sleep Apnea, Diabetes and Dyslipidemia.  Sonographer:     Philomena Daring Referring Phys:  [id number] SUMAYYA AMIN Diagnosing Phys: Redell Cave MD IMPRESSIONS  1. Left ventricular ejection fraction, by estimation, is 55 to 60%. The left ventricle has normal function. The left ventricle has no regional wall motion abnormalities. There is mild left ventricular hypertrophy. Left ventricular diastolic parameters were normal.  2. Right ventricular systolic function is normal. The right ventricular size is normal.  3. The mitral valve is normal in structure. No evidence of mitral valve regurgitation.  4. The aortic valve was not well visualized. Aortic valve regurgitation is not visualized.  5. The inferior vena cava is normal in size with greater than 50% respiratory variability, suggesting right atrial pressure of 3 mmHg. FINDINGS  Left Ventricle: Left ventricular ejection fraction, by estimation, is 55 to 60%. The left ventricle has normal function. The left ventricle has no regional wall motion abnormalities. Definity  contrast agent was given IV to delineate the left ventricular   endocardial borders. The left ventricular internal cavity size was normal in size. There is mild left ventricular hypertrophy. Left ventricular diastolic parameters were normal. Right Ventricle: The right ventricular size is normal. No increase in right ventricular wall thickness. Right ventricular systolic function is normal. Left Atrium: Left atrial size was normal in size. Right Atrium: Right atrial size was normal in size. Pericardium: There is no evidence of pericardial effusion. Mitral Valve: The mitral valve is normal in structure. No evidence of mitral valve regurgitation. Tricuspid Valve: The tricuspid valve is normal in structure. Tricuspid valve regurgitation is not demonstrated. Aortic Valve: The aortic valve was not well visualized. Aortic valve regurgitation is not visualized. Pulmonic Valve: The pulmonic valve was normal in structure. Pulmonic valve regurgitation is not visualized. Aorta: The aortic root is normal in size and structure. Venous: The inferior vena cava is normal in size with greater than 50% respiratory variability, suggesting right atrial pressure of 3 mmHg. IAS/Shunts: No atrial level shunt detected by color flow Doppler.  LEFT VENTRICLE PLAX 2D LVIDd:         4.10 cm   Diastology LVIDs:         2.80 cm   LV e' lateral:   11.20 cm/s LV PW:         1.10 cm   LV E/e' lateral: 12.2 LV IVS:        1.10 cm LVOT diam:     2.00 cm LV SV:         47 LV SV Index:   20 LVOT Area:     3.14 cm  RIGHT VENTRICLE             IVC RV S prime:     12.20 cm/s  IVC diam: 2.00  cm TAPSE (M-mode): 2.0 cm LEFT ATRIUM             Index        RIGHT ATRIUM           Index LA diam:        2.80 cm 1.15 cm/m   RA Area:     10.90 cm LA Vol (A2C):   48.6 ml 20.03 ml/m  RA Volume:   25.10 ml  10.35 ml/m LA Vol (A4C):   32.1 ml 13.23 ml/m LA Biplane Vol: 41.4 ml 17.06 ml/m  AORTIC VALVE LVOT Vmax:   107.00 cm/s LVOT Vmean:  68.200 cm/s LVOT VTI:    0.151 m  AORTA Ao Root diam: 2.60 cm MITRAL VALVE MV Area  (PHT): 6.17 cm     SHUNTS MV Decel Time: 123 msec     Systemic VTI:  0.15 m MV E velocity: 137.00 cm/s  Systemic Diam: 2.00 cm Redell Cave MD Electronically signed by Redell Cave MD Signature Date/Time: 12/18/2023/12:40:32 PM    Final    US  Venous Img Lower Bilateral Result Date: 12/17/2023 CLINICAL DATA:  Pain and swelling, +dimer EXAM: BILATERAL LOWER EXTREMITY VENOUS DOPPLER ULTRASOUND TECHNIQUE: Gray-scale sonography with graded compression, as well as color Doppler and duplex ultrasound were performed to evaluate the lower extremity deep venous systems from the level of the common femoral vein and including the common femoral, femoral, profunda femoral, popliteal and calf veins including the posterior tibial, peroneal and gastrocnemius veins when visible. The superficial great saphenous vein was also interrogated. Spectral Doppler was utilized to evaluate flow at rest and with distal augmentation maneuvers in the common femoral, femoral and popliteal veins. COMPARISON:  None Available. FINDINGS: RIGHT LOWER EXTREMITY Common Femoral Vein: No evidence of thrombus. Normal compressibility, respiratory phasicity and response to augmentation. Saphenofemoral Junction: No evidence of thrombus. Normal compressibility and flow on color Doppler imaging. Profunda Femoral Vein: No evidence of thrombus. Normal compressibility and flow on color Doppler imaging. Femoral Vein: No evidence of thrombus. Normal compressibility, respiratory phasicity and response to augmentation. Popliteal Vein: No evidence of thrombus. Normal compressibility, respiratory phasicity and response to augmentation. Calf Veins: No evidence of thrombus. Normal compressibility and flow on color Doppler imaging. Superficial Great Saphenous Vein: No evidence of thrombus. Normal compressibility. Other Findings:  None. LEFT LOWER EXTREMITY Common Femoral Vein: No evidence of thrombus. Normal compressibility, respiratory phasicity and response  to augmentation. Saphenofemoral Junction: No evidence of thrombus. Normal compressibility and flow on color Doppler imaging. Profunda Femoral Vein: No evidence of thrombus. Normal compressibility and flow on color Doppler imaging. Femoral Vein: No evidence of thrombus. Normal compressibility, respiratory phasicity and response to augmentation. Popliteal Vein: No evidence of thrombus. Normal compressibility, respiratory phasicity and response to augmentation. Calf Veins: No evidence of thrombus. Normal compressibility and flow on color Doppler imaging. Superficial Great Saphenous Vein: No evidence of thrombus. Normal compressibility. Other Findings:  None. IMPRESSION: Negative for deep venous thrombosis within both legs. Electronically Signed   By: Rogelia Myers M.D.   On: 12/17/2023 18:41   CT Angio Chest PE W/Cm &/Or Wo Cm Result Date: 12/17/2023 CLINICAL DATA:  Pulmonary embolism (PE) suspected, low to intermediate prob, positive D-dimer, left-sided chest pain with nausea and dyspnea EXAM: CT ANGIOGRAPHY CHEST WITH CONTRAST TECHNIQUE: Multidetector CT imaging of the chest was performed using the standard protocol during bolus administration of intravenous contrast. Multiplanar CT image reconstructions and MIPs were obtained to evaluate the vascular anatomy. RADIATION DOSE REDUCTION: This exam  was performed according to the departmental dose-optimization program which includes automated exposure control, adjustment of the mA and/or kV according to patient size and/or use of iterative reconstruction technique. CONTRAST:  OMNIPAQUE  IOHEXOL  350 MG/ML SOLN COMPARISON:  December 17, 2023 FINDINGS: Pulmonary Embolism: Small burden, nonobstructive segmental and subsegmental pulmonary emboli in the right lower lobe. A nonocclusive proximal segmental embolus may also be present in the anterior right upper lobe. No findings of right heart strain. Cardiovascular: No cardiomegaly or pericardial effusion.No aortic  aneurysm. Mediastinum/Nodes: No mediastinal mass. No mediastinal, hilar, or axillary lymphadenopathy. Lungs/Pleura: The midline trachea and bronchi are patent. No focal airspace consolidation, pleural effusion, or pneumothorax. Musculoskeletal: No acute fracture or destructive bone lesion. Multilevel degenerative disc disease of the spine. Upper Abdomen: No acute abnormality in the partially visualized upper abdomen. Review of the MIP images confirms the above findings. IMPRESSION: 1. Small burden, nonobstructive segmental and subsegmental pulmonary emboli in the right lower lobe. No findings of right heart strain. 2. No pneumonia, pulmonary edema, or pleural effusion. Critical Value/emergent results were called by telephone at the time of interpretation on 12/17/2023 at 6:40 pm to provider [facility] , who verbally acknowledged these results. Electronically Signed   By: Rogelia Myers M.D.   On: 12/17/2023 18:41   DG Chest 2 View Result Date: 12/17/2023 EXAM: 2 VIEW(S) XRAY OF THE CHEST 12/17/2023 12:58:47 PM COMPARISON: 09/12/2020 CLINICAL HISTORY: Chest pain. C/O left sided chest pain radiates down left arm x 2 days with nausea and SOB. Hx of left sided breast cancer. FINDINGS: LUNGS AND PLEURA: No focal pulmonary opacity. No pulmonary edema. No pleural effusion. No pneumothorax. HEART AND MEDIASTINUM: No acute abnormality of the cardiac and mediastinal silhouettes. BONES AND SOFT TISSUES: No acute osseous abnormality. LIMITATIONS/ARTIFACTS: Lateral view degraded by patient arm position, not raised above the head. Apical lordotic frontal positioning. IMPRESSION: 1. No acute process. Electronically signed by: Rockey Kilts MD 12/17/2023 01:06 PM EDT RP Workstation: [id number]    Microbiology: Results for orders placed or performed during the hospital encounter of 12/17/23  Respiratory (~20 pathogens) panel by PCR     Status: None   Collection Time: 12/17/23  9:28 PM   Specimen: Nasopharyngeal Swab;  Respiratory  Result Value Ref Range Status   Adenovirus NOT DETECTED NOT DETECTED Final   Coronavirus 229E NOT DETECTED NOT DETECTED Final    Comment: (NOTE) The Coronavirus on the Respiratory Panel, DOES NOT test for the novel  Coronavirus (2019 nCoV)    Coronavirus HKU1 NOT DETECTED NOT DETECTED Final   Coronavirus NL63 NOT DETECTED NOT DETECTED Final   Coronavirus OC43 NOT DETECTED NOT DETECTED Final   Metapneumovirus NOT DETECTED NOT DETECTED Final   Rhinovirus / Enterovirus NOT DETECTED NOT DETECTED Final   Influenza A NOT DETECTED NOT DETECTED Final   Influenza B NOT DETECTED NOT DETECTED Final   Parainfluenza Virus 1 NOT DETECTED NOT DETECTED Final   Parainfluenza Virus 2 NOT DETECTED NOT DETECTED Final   Parainfluenza Virus 3 NOT DETECTED NOT DETECTED Final   Parainfluenza Virus 4 NOT DETECTED NOT DETECTED Final   Respiratory Syncytial Virus NOT DETECTED NOT DETECTED Final   Bordetella pertussis NOT DETECTED NOT DETECTED Final   Bordetella Parapertussis NOT DETECTED NOT DETECTED Final   Chlamydophila pneumoniae NOT DETECTED NOT DETECTED Final   Mycoplasma pneumoniae NOT DETECTED NOT DETECTED Final    Comment: Performed at [facility], [address], [place], [place] [address]    Labs: CBC: Recent Labs  Lab 12/17/23 1248 12/18/23 0334 12/20/23 0819  WBC 7.9 11.2* 9.6  NEUTROABS  --   --  6.6  HGB 13.1 12.1 12.7  HCT 39.0 36.3 38.6  MCV 95.8 95.5 96.0  PLT 363 347 373   Basic Metabolic Panel: Recent Labs  Lab 12/17/23 1248 12/18/23 0334 12/18/23 0344 12/20/23 0819  NA 138 138  --  136  K 3.7 3.2*  --  3.5  CL 102 104  --  102  CO2 24 23  --  23  GLUCOSE 139* 100*  --  115*  BUN 8 11  --  14  CREATININE 0.70 0.63  --  0.69  CALCIUM  9.3 8.7*  --  9.5  MG  --   --  2.2  --    Liver Function Tests: Recent Labs  Lab 12/18/23 0334  AST 22  ALT 16  ALKPHOS 54  BILITOT 0.9  PROT 6.6  ALBUMIN 3.4*   CBG: Recent Labs  Lab 12/22/23 1210  12/22/23 1533 12/22/23 2025 12/23/23 0820 12/23/23 1143  GLUCAP 102* 131* 120* 132* 118*    Discharge time spent: greater than 30 minutes.  Signed: Laree Lock, MD [facility] 12/23/2023

## 2023-12-23 NOTE — Plan of Care (Signed)

## 2023-12-24 ENCOUNTER — Ambulatory Visit: Admitting: Nurse Practitioner

## 2023-12-24 DIAGNOSIS — I2699 Other pulmonary embolism without acute cor pulmonale: Secondary | ICD-10-CM | POA: Diagnosis not present

## 2023-12-25 DIAGNOSIS — M170 Bilateral primary osteoarthritis of knee: Secondary | ICD-10-CM | POA: Diagnosis not present

## 2023-12-25 DIAGNOSIS — I10 Essential (primary) hypertension: Secondary | ICD-10-CM | POA: Diagnosis not present

## 2023-12-29 DIAGNOSIS — G4700 Insomnia, unspecified: Secondary | ICD-10-CM | POA: Diagnosis not present

## 2023-12-29 DIAGNOSIS — M170 Bilateral primary osteoarthritis of knee: Secondary | ICD-10-CM | POA: Diagnosis not present

## 2023-12-30 DIAGNOSIS — I10 Essential (primary) hypertension: Secondary | ICD-10-CM | POA: Diagnosis not present

## 2024-01-01 DIAGNOSIS — G4700 Insomnia, unspecified: Secondary | ICD-10-CM | POA: Diagnosis not present

## 2024-01-05 DIAGNOSIS — I10 Essential (primary) hypertension: Secondary | ICD-10-CM | POA: Diagnosis not present

## 2024-01-05 DIAGNOSIS — I2699 Other pulmonary embolism without acute cor pulmonale: Secondary | ICD-10-CM | POA: Diagnosis not present

## 2024-01-05 DIAGNOSIS — M170 Bilateral primary osteoarthritis of knee: Secondary | ICD-10-CM | POA: Diagnosis not present

## 2024-01-05 DIAGNOSIS — G4700 Insomnia, unspecified: Secondary | ICD-10-CM | POA: Diagnosis not present

## 2024-01-06 DIAGNOSIS — G4700 Insomnia, unspecified: Secondary | ICD-10-CM | POA: Diagnosis not present

## 2024-01-06 DIAGNOSIS — Z86711 Personal history of pulmonary embolism: Secondary | ICD-10-CM | POA: Diagnosis not present

## 2024-01-06 DIAGNOSIS — M6259 Muscle wasting and atrophy, not elsewhere classified, multiple sites: Secondary | ICD-10-CM | POA: Diagnosis not present

## 2024-01-06 DIAGNOSIS — K5900 Constipation, unspecified: Secondary | ICD-10-CM | POA: Diagnosis not present

## 2024-01-06 DIAGNOSIS — E118 Type 2 diabetes mellitus with unspecified complications: Secondary | ICD-10-CM | POA: Diagnosis not present

## 2024-01-06 DIAGNOSIS — E569 Vitamin deficiency, unspecified: Secondary | ICD-10-CM | POA: Diagnosis not present

## 2024-01-06 DIAGNOSIS — E785 Hyperlipidemia, unspecified: Secondary | ICD-10-CM | POA: Diagnosis not present

## 2024-01-06 DIAGNOSIS — R52 Pain, unspecified: Secondary | ICD-10-CM | POA: Diagnosis not present

## 2024-01-06 DIAGNOSIS — R2681 Unsteadiness on feet: Secondary | ICD-10-CM | POA: Diagnosis not present

## 2024-01-06 DIAGNOSIS — I2699 Other pulmonary embolism without acute cor pulmonale: Secondary | ICD-10-CM | POA: Diagnosis not present

## 2024-01-06 DIAGNOSIS — I10 Essential (primary) hypertension: Secondary | ICD-10-CM | POA: Diagnosis not present

## 2024-01-07 DIAGNOSIS — K219 Gastro-esophageal reflux disease without esophagitis: Secondary | ICD-10-CM | POA: Diagnosis not present

## 2024-01-07 DIAGNOSIS — G8929 Other chronic pain: Secondary | ICD-10-CM | POA: Diagnosis not present

## 2024-01-07 DIAGNOSIS — Z79899 Other long term (current) drug therapy: Secondary | ICD-10-CM | POA: Diagnosis not present

## 2024-01-07 DIAGNOSIS — R079 Chest pain, unspecified: Secondary | ICD-10-CM | POA: Diagnosis not present

## 2024-01-07 DIAGNOSIS — Z86711 Personal history of pulmonary embolism: Secondary | ICD-10-CM | POA: Diagnosis not present

## 2024-01-07 DIAGNOSIS — E119 Type 2 diabetes mellitus without complications: Secondary | ICD-10-CM | POA: Diagnosis not present

## 2024-01-07 DIAGNOSIS — Z7985 Long-term (current) use of injectable non-insulin antidiabetic drugs: Secondary | ICD-10-CM | POA: Diagnosis not present

## 2024-01-07 DIAGNOSIS — M170 Bilateral primary osteoarthritis of knee: Secondary | ICD-10-CM | POA: Diagnosis not present

## 2024-01-07 DIAGNOSIS — Z9221 Personal history of antineoplastic chemotherapy: Secondary | ICD-10-CM | POA: Diagnosis not present

## 2024-01-07 DIAGNOSIS — Z853 Personal history of malignant neoplasm of breast: Secondary | ICD-10-CM | POA: Diagnosis not present

## 2024-01-07 DIAGNOSIS — Z7901 Long term (current) use of anticoagulants: Secondary | ICD-10-CM | POA: Diagnosis not present

## 2024-01-07 DIAGNOSIS — E876 Hypokalemia: Secondary | ICD-10-CM | POA: Diagnosis not present

## 2024-01-07 DIAGNOSIS — Z560 Unemployment, unspecified: Secondary | ICD-10-CM | POA: Diagnosis not present

## 2024-01-07 DIAGNOSIS — E782 Mixed hyperlipidemia: Secondary | ICD-10-CM | POA: Diagnosis not present

## 2024-01-07 DIAGNOSIS — I10 Essential (primary) hypertension: Secondary | ICD-10-CM | POA: Diagnosis not present

## 2024-01-07 DIAGNOSIS — Z5320 Procedure and treatment not carried out because of patient's decision for unspecified reasons: Secondary | ICD-10-CM | POA: Diagnosis not present

## 2024-01-07 DIAGNOSIS — E785 Hyperlipidemia, unspecified: Secondary | ICD-10-CM | POA: Diagnosis not present

## 2024-01-07 DIAGNOSIS — R519 Headache, unspecified: Secondary | ICD-10-CM | POA: Diagnosis not present

## 2024-01-07 DIAGNOSIS — R410 Disorientation, unspecified: Secondary | ICD-10-CM | POA: Diagnosis not present

## 2024-01-07 NOTE — Consults (Signed)
 [facility] ED Telepsych Consultation  Date: 01/07/24 Patient: Sherry Price DOB: 1970/07/22  Consult to Telepsych Services Consult performed by: Stephane JINNY Andrea, MD Consult ordered by: Oneil JINNY Greener, MD     Location Information: Patient State (at time of visit): [place]  Patient Location (at time of visit):Medical Facility: [facility] Provider Location: Non-Provider-Based Clinic (Clinic, non-hospital) Is provider licensed to provide clinical care in the current location/state of the patient? Yes   Consent:  Patient's identity was confirmed. Presenting condition or illness was discussed with the patient/personal representative. Current proposed treatment for presenting condition or illness was explained to patient/personal representative along with the likely benefits and any significant risks or complications associated with the provision of treatment by audio/video means. The patient/personal representative verbally authorized treatment to be provided by audio/video, which may include a limited review of patient's current health status, medication, or other treatment recommendations, patient education, and an opportunity to ask questions about condition and treatment. Verbal Consent Granted by Patient/Personal Representative:Yes   Visit Information: Modality: 2-Way Real-Time Audio/Video  Video Start Time: 706 Video Stop Time: 715 Video Total Time: 9 min  Reason for Consultation: altered mental status  Recommend medical admission  Assessment: This is a 53 year old female who presents to the emergency department from her skilled nursing facility.  Patient was admitted there on December 23, 2023 after being treated for pulmonary embolism at [facility].  [facility] that the patient has been having hallucinations and barricaded herself in the bathroom and in her room several times.  Patient upon interview is very distractible.  Was oriented but has a  difficult time with concentration.  She was able to count backwards from 20-1 but had a lot of hesitations and missteps.  Was able to spell world backwards.  Was able to immediately retain 3 items but could not recall them after a few minutes.  During the interview the patient was constantly looking out of the door complaining that the staff was about to throw her daughter out (that was not true)  I did speak to the patient's daughter who stated that the patient for the past week has been acting bizarrely, has been waxing and having waxing and waning agitation and paranoia has been having auditory hallucinations, intermittently stating that she can hear people talking about her.  Daughter states that this is only been going on for the past week.  She states that the patient in the past has had brief episodes of paranoia but nothing like this.  Daughter denies that the patient has ever had auditory hallucinations in the past.  Patient does have a urinary tract infection.  This infection on top of the fact that she is probably compromised due to her recent pulmonary embolism, relatively abrupt onset of the symptoms within the last week and also waxing and waning of the process (according to the daughter) leads me to believe that the patient may be manifesting a delirium.  I do recommend that she be treated medically for this and have psychiatric consultation and liaison follow along.  Would recommend olanzapine 2.5 mg nightly and olanzapine 2.5 mg every 6 hours as needed for agitation.  IM olanzapine 5 mg every 4 hours as needed for severe or psychotic agitation.  If repeat UA is clean as there was some question that this UA was contaminated would recommend admission to 6B.  Recommend medical admission for treatment of probable delirium  Medications Recommended: Olanzapine 2.5 mg nightly Olanzapine 2.5  mg every 4 hours as needed for agitation [place] appearing 5 mg IM every 4 hours as needed for severe  or psychotic agitation Assessment & Plan: Diagnosis: Altered mental status  Chief Complaint:  Mental Health Problem (Pt BIB Medic from [facility], staff said that pt has been having hallucination and has barricaded herself in the bathroom and her room a few times; look in chart for the nursing and staff notes. Pt hx of Anxiety and depression )   HPI Reason for Admission:  I reviewed the behavioral health clinicians note as well as associated collateral.   This is a 53 year old female who presents to the emergency department from her skilled nursing facility.  Patient was admitted there on December 23, 2023 after being treated for pulmonary embolism at [facility].  Staff at skilled nursing facility states that the patient has been having hallucinations and barricaded herself in the bathroom and in her room several times.  Staff felt that the patient was paranoid.  Urine pregnancy is negative.  COVID is negative.  Urinalysis is positive for large amount of leukocyte esterase and large amount of white blood cells.  The EMR was reviewed.    Patient was admitted to [facility] December 18, 2023 for treatment of pulmonary embolism and was discharged December 23, 2023.    Patient patient also has a history of outpatient treatment for depression and her most recent visit with the behavioral health outpatient practitioner was December 10, 2023 at which time patient admitted to alcohol abuse.  Diagnosis was PTSD and alcohol use disorder acamprosate was added to her medication regimen.    Patient upon interview is very distractible constantly looking out of the door.  She then was saying that her daughter was arguing with somebody and the staff was about to throw her out.  Patient denies any depression or suicidality.  She states that she has been under stress recently.  States that she is going to be evicted from her home.  She also had a pulmonary embolism and was recently hospitalized for that and has  not been able to work because of other health issues.  Patient does have a history of depression and anxiety and states that she has been compliant with her Effexor  which she has been on for some time.  Patient denies auditory hallucinations.  She does admit to barricading herself in her room in the skilled nursing facility as she stated that she felt anxious and needed some time by herself.  Patient denies any psychiatric hospitalizations or suicide attempts.  She denies alcohol abuse.  Denies any other drug abuse.  She denies any knowledge of family psychiatric history.  Patient states that she has some college.  She has never been married.  She states that she has 5 children.  She is currently unemployed.  I did speak to the patient's daughter who states that the patient has been acting bizarrely for the past week.  She states that she goes in and out of this type of behavior.  She states that the patient will say that people are talking about her that someone trying to harm her.  Daughter states that the auditory hallucinations are new.  She states that she has had brief episodes of paranoia in the past but nothing that ever lasted this long.        Behavioral health clinician was able to speak to the patient's daughter for collateral.  As per behavioral health clinicians note:  Collateral Contact #  1 Name: Jettie Relationship: Daughter Consent Obtained: Yes Phone: [phone]   Pt began experiencing symptoms about a week ago. Daughter suspects pt is having psychosis, reporting that pt believes people are listening through the phone and outside the SNF. Pt asked daughter to come get her. Daughter had to buy pt a new phone yesterday due to paranoia. Daughter believes pt is experiencing AH which is new. She has had paranoid episodes in the past during high stress, but never AH. Pt reports hearing voices and appears very paranoid. Recent stressors include health issues, eviction, and  job loss. Psych history includes bipolar disorder, depression, anxiety, and PTSD. Pt was recently relocated to a closer SNF for daughter's support after a PE hospitalization. No recent SI. Pt is compliant with psych meds (psychiatrist with [facility]) but daughter feels they need reevaluation. Sleep has been disrupted for weeks. Daughter thinks pt is unsafe for d/c at this time.         Current Medications[1]  Allergies[2]  ROS  12 point ROS is negative except for what is mentioned above   Exam Vitals:   01/07/24 1229  BP: 144/78  Pulse: 102  Resp: 18  Temp: 98.3 F (36.8 C)  TempSrc: Oral  SpO2: 100%      Mental Status Examination: General Appearance normal body habitus  General Behavior cooperative  Psychomotor Activity normoactive  Gait and Station not assessed  Speech   normal volume  Mood   anxious  Affect    congruent  Thought Process linear/organized  Associations intact  Thought Content/Perceptual Disturbances Denies suicidal/homicidal ideation and auditory/visual hallucinations.  Cognition/Sensorium  orientation grossly intact  Insight  limited  Judgment limited    History  Past Psychiatric History  . OP Providers psychiatrist   . Past and current psychiatric diagnoses anxiety, depression, bipolar, PTSD     Medical History[3]  Family History[4]  Social History   Social History Narrative  . Not on file    Past Psych History/Family Psych History/Social History: Please see HPI  Risk Assessment: [place] SSRS: reviewed Engagement: fair Risk Status (specify low, moderate, or high based on normal population for care site): high Risk State (relative to baseline for patient): high Protective Factors: supportive friend or family and attachment to family/friends Resources: outpatient provider Foreseeable Changes: worsening mood Contingency Plans: Medical admit  Attestation:  Personally Reviewed: Current visit triage/intake/medical  record as applicable  Reviewed Documentation: Congruent with exam New/Changed Medications: Risks/benefits discussed with patient and/or legally responsible person This assessment/plan of care was discussed with: Patient Consulting Provider Location: Non facility location Present with Provider: No one Present with Patient: No one Time Spent on Consultation: 60 min  Over 50% of the time noted above was spent in counseling and/or coordination of care.  This service was performed via telemedicine using 2-way, interactive and video technology.  I certify to a reasonable degree of medical certainty that the results of this examination via telemedicine were the same as if I had been personally present with the patient.   On the day of the visit I spent 60 minutes preparing to see the patient, obtaining and/or reviewing separately obtained history, performing a medically appropriate examination and evaluation, counseling and educating the patient/caregiver, ordering medications, test, or procedures, and documenting clinical information in the electronic medical record.This time does not include any time spent performing procedures or assesments that are separately billable.    Stephane JINNY Andrea, MD             [  1] No current facility-administered medications for this encounter.  Current Outpatient Medications:  .  amLODIPine  (NORVASC ) 5 mg tablet, Take 5 mg by mouth daily., Disp: , Rfl:  .  atorvastatin  (LIPITOR) 40 mg tablet, Take 40 mg by mouth at bedtime., Disp: , Rfl:  .  buprenorphine  (Butrans ) 7.5 mcg/hour ptwk patch, Place 1 patch on the skin every 7 days. Apply on Sunday, Disp: , Rfl:  .  calcium  carbonate (TUMS) 500 mg (200 mg calcium ) chewable tablet, Chew 1 tablet every 12 (twelve) hours., Disp: , Rfl:  .  diclofenac  sodium (VOLTAREN ) 1 % gel, Apply 2 g topically every 8 (eight) hours as needed (for pian). Apply to right knee as needed for pain, Disp: , Rfl:  .   hydroCHLOROthiazide  (HYDRODIURIL ) 12.5 mg capsule, Take 12.5 mg by mouth daily., Disp: , Rfl:  .  labetaloL  (NORMODYNE ) 200 mg tablet, Take 200 mg by mouth 2 (two) times a day., Disp: , Rfl:  .  LORazepam (ATIVAN) 0.5 mg tablet, Take 0.5 mg by mouth every 8 (eight) hours as needed for anxiety., Disp: , Rfl:  .  naloxone (Narcan) 4 mg/actuation spry nasal spray, Administer 1 spray into affected nostril(s) every 8 (eight) hours as needed for opioid reversal. Spray every 8 hours as needed for S/S of Opoid overdose, Disp: , Rfl:  .  oxyCODONE -acetaminophen  (PERCOCET) 5-325 mg per tablet, Take 1 tablet by mouth every 8 (eight) hours as needed for severe pain (7-10)., Disp: , Rfl:  .  rivaroxaban  (XARELTO ) 15 mg tablet, Take 15 mg by mouth 2 (two) times a day., Disp: , Rfl:  .  sennosides-docusate sodium  (PERICOLACE) 8.6-50 mg per tablet, Take 1 tablet by mouth 2 (two) times a day., Disp: , Rfl:  .  tirzepatide  (Mounjaro ) 15 mg/0.5 mL subcutaneous pen injector, Inject 15 mg under the skin every 7 days. Dose on Friday, Disp: , Rfl:  .  traZODone (DESYREL) 50 mg tablet, Take 50 mg by mouth at bedtime., Disp: , Rfl:  .  venlafaxine  (EFFEXOR  XR) 150 mg 24 hr capsule, Take 150 mg by mouth daily., Disp: , Rfl:  .  prazosin  (MINIPRESS ) 1 mg capsule, Take 1 mg by mouth at bedtime., Disp: , Rfl:  [2] Allergies Allergen Reactions  . Lisinopril Angioedema and Swelling    Other Reaction(s): cough (and knee pain per patient)  . Losartan Angioedema and Hives  [3] Past Medical History: Diagnosis Date  . Breast cancer    (CMD)   [4] No family history on file.

## 2024-01-08 DIAGNOSIS — I10 Essential (primary) hypertension: Secondary | ICD-10-CM | POA: Diagnosis not present

## 2024-01-08 DIAGNOSIS — Z86711 Personal history of pulmonary embolism: Secondary | ICD-10-CM | POA: Diagnosis not present

## 2024-01-08 DIAGNOSIS — Z853 Personal history of malignant neoplasm of breast: Secondary | ICD-10-CM | POA: Diagnosis not present

## 2024-01-09 DIAGNOSIS — Z86711 Personal history of pulmonary embolism: Secondary | ICD-10-CM | POA: Diagnosis not present

## 2024-01-09 DIAGNOSIS — Z853 Personal history of malignant neoplasm of breast: Secondary | ICD-10-CM | POA: Diagnosis not present

## 2024-01-09 DIAGNOSIS — I10 Essential (primary) hypertension: Secondary | ICD-10-CM | POA: Diagnosis not present

## 2024-01-10 DIAGNOSIS — Z853 Personal history of malignant neoplasm of breast: Secondary | ICD-10-CM | POA: Diagnosis not present

## 2024-01-10 DIAGNOSIS — R410 Disorientation, unspecified: Secondary | ICD-10-CM | POA: Diagnosis not present

## 2024-01-10 DIAGNOSIS — Z86711 Personal history of pulmonary embolism: Secondary | ICD-10-CM | POA: Diagnosis not present

## 2024-01-10 DIAGNOSIS — I10 Essential (primary) hypertension: Secondary | ICD-10-CM | POA: Diagnosis not present

## 2024-01-11 DIAGNOSIS — Z86711 Personal history of pulmonary embolism: Secondary | ICD-10-CM | POA: Diagnosis not present

## 2024-01-11 DIAGNOSIS — Z853 Personal history of malignant neoplasm of breast: Secondary | ICD-10-CM | POA: Diagnosis not present

## 2024-01-11 DIAGNOSIS — R410 Disorientation, unspecified: Secondary | ICD-10-CM | POA: Diagnosis not present

## 2024-01-11 DIAGNOSIS — I10 Essential (primary) hypertension: Secondary | ICD-10-CM | POA: Diagnosis not present

## 2024-01-12 DIAGNOSIS — G894 Chronic pain syndrome: Secondary | ICD-10-CM | POA: Diagnosis not present

## 2024-01-12 DIAGNOSIS — E785 Hyperlipidemia, unspecified: Secondary | ICD-10-CM | POA: Diagnosis not present

## 2024-01-12 DIAGNOSIS — R410 Disorientation, unspecified: Secondary | ICD-10-CM | POA: Diagnosis not present

## 2024-01-12 DIAGNOSIS — E1165 Type 2 diabetes mellitus with hyperglycemia: Secondary | ICD-10-CM | POA: Diagnosis not present

## 2024-01-12 DIAGNOSIS — G4733 Obstructive sleep apnea (adult) (pediatric): Secondary | ICD-10-CM | POA: Diagnosis not present

## 2024-01-12 DIAGNOSIS — Z853 Personal history of malignant neoplasm of breast: Secondary | ICD-10-CM | POA: Diagnosis not present

## 2024-01-12 DIAGNOSIS — E1169 Type 2 diabetes mellitus with other specified complication: Secondary | ICD-10-CM | POA: Diagnosis not present

## 2024-01-12 DIAGNOSIS — I10 Essential (primary) hypertension: Secondary | ICD-10-CM | POA: Diagnosis not present

## 2024-01-12 DIAGNOSIS — E876 Hypokalemia: Secondary | ICD-10-CM | POA: Diagnosis not present

## 2024-01-12 DIAGNOSIS — I2699 Other pulmonary embolism without acute cor pulmonale: Secondary | ICD-10-CM | POA: Diagnosis not present

## 2024-01-14 ENCOUNTER — Ambulatory Visit: Admitting: Nurse Practitioner

## 2024-01-19 DIAGNOSIS — M25552 Pain in left hip: Secondary | ICD-10-CM | POA: Diagnosis not present

## 2024-01-19 DIAGNOSIS — Z7901 Long term (current) use of anticoagulants: Secondary | ICD-10-CM | POA: Diagnosis not present

## 2024-01-19 DIAGNOSIS — R590 Localized enlarged lymph nodes: Secondary | ICD-10-CM | POA: Diagnosis not present

## 2024-01-19 DIAGNOSIS — E876 Hypokalemia: Secondary | ICD-10-CM | POA: Diagnosis not present

## 2024-01-19 DIAGNOSIS — S79922A Unspecified injury of left thigh, initial encounter: Secondary | ICD-10-CM | POA: Diagnosis not present

## 2024-01-19 DIAGNOSIS — S8992XA Unspecified injury of left lower leg, initial encounter: Secondary | ICD-10-CM | POA: Diagnosis not present

## 2024-01-19 DIAGNOSIS — M79652 Pain in left thigh: Secondary | ICD-10-CM | POA: Diagnosis not present

## 2024-01-19 DIAGNOSIS — S79912A Unspecified injury of left hip, initial encounter: Secondary | ICD-10-CM | POA: Diagnosis not present

## 2024-01-19 DIAGNOSIS — W19XXXA Unspecified fall, initial encounter: Secondary | ICD-10-CM | POA: Diagnosis not present

## 2024-01-23 NOTE — Progress Notes (Signed)
 [facility] -  Inpatient Progress Note   Date of Service: 01/17/2024 Length of stay: Hospital Day: 5 Date of birth: 1970/12/13 Assessment   Hospital Diagnoses: Principal Problem:   Psychosis, unspecified psychosis type (*)   53 year old female who presented to [facility] on 01/12/2024 at 1857 via medic for a behavioral health evaluation. Per check in notes, Arrives via medic from [facility] requesting a psych evaluation - reports the patient is a new resident at [facility] and has had bizarre behavior - walking in and out of other resident rooms, auditory hallucinations, and paranoia. Reports hx of schizophrenia. Denies SI/HI. Was discharged from [facility] earlier today with dx of UTI.    Client was seen by the ED Provider, [facility] Clinician and now by this provider. Client recently at [facility] Observation from 01/07/2024 to 01/12/2024. There was a concern for Delirium. While at the hospital, she was taking Zyprexa. Her psychiatric medications included Effexor , Trazodone and Prazosin . Prior to seeing [facility], she was observed talking to herself. There is notation that hallucinations began in mid August. Again, she was admitted to [facility] from 9/4 to 9/9. She discharged back to [facility] but behaviors were concerning and so she was brought to the hospital for an evaluation.    The patient has been evaluated and determined to be medically stable by the ED provider.  Patient has been assessed by the ED [facility] Therapist and the findings have been discussed with this provider.  Psychiatry was consulted to assist with psychiatric assessment and treatment/disposition planning.  The chart has been reviewed and pertinent findings are noted below. Based on this review and assessment, the treatment plan has been created and discussed with the treatment team.    Safety Assessment:  Individualized risk factors include: impulsiveness.   Individualized protective factors include: patient has treatable psychiatric disorders and symptoms.  Taking the aforementioned non-modifiable and modifiable risk factors in conjunction with her protective factors, the patient is currently felt to be of low imminent risk of harm to self.    Treatment Plan    - Disposition:             -voluntary - Precautions:             - suicide and fall - Goals and Interventions:             - Group and milieu therapy with family/outpatient support meeting as tolerated. Treatment plan focuses on modifiable risk factors and includes: level of care coordination and recommendations. - Medications: QTC normalized on 9/19 low dose risperdal at bedtime  Trazodone 50 mg Reduce effexor  XR 37.5 mg daily with plan to taper off Start Lexapro 10 mg daily Prazosin  1 mg - Pertinent Labs: k is 4  EKG nl qtc             -  K+ 3.1, Creatinine 0.76, AST 23 and ALT 20             - wbc 15.2, rbc 4.04,Hgb 12.7 and Hct 38.3             - UA with Negative Nitrites, Leukocytes, Protein and Glucose              - UDS Negative              - CT Angio Chest: Small amount of high density material within segmental right lower lobe pulmonary artery branches, either calcified  thrombus or other high density embolized material. No right heart strain.              - Head CT: No acute intracranial abnormality.  HGBA1C(01/08/2024) 6 Lipid panel(01/14/2024)   - Consults:             - consulted NICS appreciate work up - Estimated date of discharge:             -  TBD             -  I certify that the patient does need, on a daily basis, active treatment furnished directly by or requiring the supervision of inpatient psychiatric facility personnel.  For inpatient care, physician will direct treatment team plan within three days of admission and at least weekly thereafter.   Interval History   Patient was seen today for re-evaluation.    Nursing reports  atient stay in her room  during the shift isolating. She is guarded during assessment and provide short answer. She refused to attend group. She remain 1:1 for safety. She reported her day was okay and sleeping good. She reported generalized pain. She denies suicidal ideation or any intent to self harm. She denies any hallucination. She is calm, cooperative and compliant with her medication. Safety monitoring round continued during the shift.   The patient reports major issues with performing ADLs.  Patient has been medication compliant.  The patient reports no side effects from medications.  Current symptoms being addressed include:  depression, anxiety, psychosis, and mood stabilization.  Since last assessment, patient reports symptoms have stayed the same.     Met with patient today. She is in her room in wheelchair with sitter present. She answers questions appropriately and is oriented, minor thought blocking noted. She states mood is improving. Continues to eat better. Denies si/hi. Slept well last pm without agitation.     9/16: I called to the patient's daughter to discuss update about medical workup.  Patient's daughter remains very concerned about patient's mental status.  She continues to report patient is sad and withdrawn.  She is concerned about ongoing paranoia as the patient changes her pin frequently.  She feels that the patient is worried about her and family's safety.  She reports Ms. Campion has been diagnosed with posttraumatic stress disorder, bipolar disorder depression and anxiety in the past.  She is not aware of any schizophrenia diagnosis.  She states that the patient does have a history of paranoia but not of auditory hallucinations.  She states that the patient was recently hospitalized at [facility] but was discharged after being treated for urinary tract infection.  The patient's daughter feels that there is more going on with her mental status.  The patient's daughter would like to be kept updated  on treatment.  Current suicidal/homicidal ideations: Denies Current auditory/visual hallucinations: distracted Review Of Systems: A complete review of systems of the following systems was conducted (Constitutional, Psychiatric, Neurological, Musculoskeletal, Eyes, Gastrointestinal, Cardiovascular, Respiratory, Skin, and Endocrine). All reviewed systems are negative except pertinent positives identified in the HPI.  Documented Sleep Last 24 Hours (hours): 6.25     Past History   Past Medical History, Past Surgery History, Allergies, Social History, and Family History were reviewed and updated as appropriate.    Social History:  Social History[1]  Evaluation   Vitals:  Vitals:   01/22/24 2005  BP: 117/73  Pulse: 82  Resp:   Temp: 98.5 F (36.9 C)  SpO2: 99%  Medications: . amLODIPine  besylate  10 mg Oral Daily  . atorvastatin   40 mg Oral Daily  . buprenorphine   1 patch Transdermal Weekly at 0900  . escitalopram oxalate  10 mg Oral Daily  . labetalol  HCl  200 mg Oral BID  . pantoprazole sodium  40 mg Oral Daily  . potassium chloride   20 mEq Oral ONCE  . prazosin  HCl  1 mg Oral HS  . rivaroxaban   20 mg Oral HS  . traZODone  50 mg Oral HS  . venlafaxine  HCl  37.5 mg Oral Daily   acetaminophen , diazepam , diphenhydrAMINE  **OR** diphenhydrAMINE , hydrOXYzine  pamoate, LORAzepam, ondansetron  **OR** ondansetron   Labs: Recent Results (from the past 72 hours)  Basic Metabolic Panel   Collection Time: 01/20/24 10:12 AM  Result Value Ref Range   Na 145 136 - 146 mmol/L   Potassium 3.5 (L) 3.7 - 5.4 mmol/L   Cl 108 97 - 108 mmol/L   CO2 25 20 - 32 mmol/L   AGAP 12 7 - 16 mmol/L   Glucose 116 (H) 65 - 99 mg/dL   BUN 8 6 - 24 mg/dL   Creatinine 9.47 (L) 9.42 - 1.00 mg/dL   Ca 9.0 8.7 - 89.7 mg/dL   BUN/CREAT RATIO 84.5 11.0 - 26.0   eGFR 110 >=60 mL/min/1.52m2  Echocardiogram Complete WO Enhancing Agent   Collection Time: 01/20/24 11:53 AM  Result Value Ref Range   AV  VMAX 1.560 m/s   AoV Peak Velocity 1.560 m/s   AoV Peak Gradient 10.000 mmHg   AoV Valve Area 2.66 cm2   AV index (prosthetic) 0.700 no units   Aortic root 3.300 cm   Ao root annulus 3.300 cm   IVSd 1.1 1.1 - 2.0 cm   IVS 1.100 cm   LVIDd 5.000 10.42 - 14.48 cm   LVIDD 5.00 cm   LVIDs 3.500 5.95 - 9.01 cm   Left Ventricular Outflow Tract Cross Section Area 22.000 mm   LVOT Diameter 2.200 cm   LVOT Peak Velocity 1.090 m/s   LVOT Peak Gradient 5.000 mmHg   LVPWd 0.800 1.11 - 2.08 cm   LVPWD 0.800 cm   LV Peak Diastolic Tissue Velocity During (A Sys Lat) 12.500 cm/s   MV E' Lateral Tissue Vel 12.500 cm/s   LV Peak Diastolic Tissue Velocity During (A Sys Med) 6.640 cm/s   MV E' Tissue Velocity Lateral 6.640 cm/s   LVEDV 125.000 ml   LV Diastolic Volume (BP) 118.000 mL   Left Ventricular EF by 2-D Biplane by Method of Disks 65.700 %   Left Ventricular EF by Teichholz Method 56.900 %   LVES V 42.900 ml   LV Systolic Volume (BP) 50.900 mL   E/E' Lateral Ratio 7.000 no units   E/E' Ratio 13.20 no units   LVFS 30.000 %   Interventricular Septum/Left Ventricular PWD by 2D 1.380 no units   LVOT Area 3.80 cm2   LV Stroke Volume 82.100 mL   LV Stroke Volume 67.100 mL   LA A-P Dimension 2.600 cm   Left [facility] Dimension 2D 2.600 cm   LA/Ao Ratio 0.788 no units   MV DT 261.000 ms   E wave deceleration time 261.000 msec   MV PHT 77.000 ms   MV PHT 77.000 ms   MV A Velocity 94.500 cm/s   MV Peak A Vel 0.945 m/s   MV E Velocity 87.900 cm/s   MV Peak E Vel 87.900 cm/s   MVA (PHT) 2.860 cm2  MV E/A 0.900 no units   TAPSE 2.340 cm   TR Peak Velocity 2.530 m/s   TR Peak Gradient 26.000 mmHg   LV Mass 170.000 g   Patient Height 69    Patient Weight 4,800    Blood Pressure Monitoring 134/73    Pulse 92    Resp Care Set Rate Venous 18    Calculated BMI 44.3    BSA 2.57 m2   Pulse Ox 99    ZLVPWD -3.32    ZLVIDS -5.83    ZLVIDD -8.99    ZIVSD -1.23   Basic Metabolic Panel    Collection Time: 01/22/24  7:45 AM  Result Value Ref Range   Na 142 136 - 146 mmol/L   Potassium 4.0 3.7 - 5.4 mmol/L   Cl 108 97 - 108 mmol/L   CO2 24 20 - 32 mmol/L   AGAP 10 7 - 16 mmol/L   Glucose 115 (H) 65 - 99 mg/dL   BUN 5 (L) 6 - 24 mg/dL   Creatinine 9.42 9.42 - 1.00 mg/dL   Ca 8.8 8.7 - 89.7 mg/dL   BUN/CREAT RATIO 8.8 (L) 11.0 - 26.0   eGFR 107 >=60 mL/min/1.65m2    Imaging Results: Echocardiogram Complete WO Enhancing Agent Result Date: 01/20/2024 Normal LV size with preserved systolic function Normal diastolic function    Results for orders placed or performed during the hospital encounter of 01/13/24  ECG 12 lead   Narrative   Diagnosis Class Abnormal Acquisition Device MV360 Ventricular Rate 69 Atrial Rate 69 P-R Interval 178 QRS Duration 102 Q-T Interval 454 QTC Calculation(Bazett) 486 Calculated P Axis 24 Calculated R Axis -14 Calculated T Axis -3  Diagnosis Normal sinus rhythm Minimal voltage criteria for LVH, may be normal variant ( R in aVL ) Inferior infarct , age undetermined Abnormal ECG No previous ECGs available Ren Latus ([id number]) on 01/20/2024 10:44:38 AM certifies that he/she has reviewed the ECG tracing and confirms the independent  interpretation is correct.  ECG 12 lead   Narrative   Diagnosis Class Abnormal Acquisition Device MV360 Ventricular Rate 73 Atrial Rate 73 P-R Interval 164 QRS Duration 102 Q-T Interval 458 QTC Calculation(Bazett) 504 Calculated P Axis 25 Calculated R Axis -12 Calculated T Axis 8  Diagnosis Normal sinus rhythm with sinus arrhythmia Minimal voltage criteria for LVH, may be normal variant ( R in aVL ) Prolonged QT Abnormal ECG When compared with ECG of 19-Jan-2024 13:05, (Unconfirmed) No significant change was found Ren, Shana ([id number]) on 01/20/2024 10:45:29 AM certifies that he/she has reviewed the ECG tracing and confirms the independent  interpretation is correct.  ECG 12-Lead   Narrative    Diagnosis Class Abnormal Acquisition Device MV360 Ventricular Rate 88 Atrial Rate 88 P-R Interval 160 QRS Duration 94 Q-T Interval 436 QTC Calculation(Bazett) 527 Calculated P Axis 50 Calculated R Axis -5 Calculated T Axis 13  Diagnosis Normal sinus rhythm Prolonged QT Abnormal ECG When compared with ECG of 19-Jan-2024 15:37, (Unconfirmed) No significant change was found Ren, Shana ([id number]) on 01/20/2024 10:50:25 AM certifies that he/she has reviewed the ECG tracing and confirms the independent  interpretation is correct.  ECG 12-Lead   Narrative   Diagnosis Class Abnormal Acquisition Device MV360 Ventricular Rate 84 Atrial Rate 84 P-R Interval 132 QRS Duration 96 Q-T Interval 418 QTC Calculation(Bazett) 493 Calculated P Axis 49 Calculated R Axis 31 Calculated T Axis 35  Diagnosis Normal sinus rhythm Prolonged QT Abnormal ECG When compared with ECG of 20-Jan-2024 05:04,  No significant change was found Ren Latus ([id number]) on 01/21/2024 2:47:22 PM certifies that he/she has reviewed the ECG tracing and confirms the independent  interpretation is correct.  ECG 12-Lead   Narrative   Diagnosis Class Normal Acquisition Device MV360 Ventricular Rate 76 Atrial Rate 76 P-R Interval 176 QRS Duration 92 Q-T Interval 424 QTC Calculation(Bazett) 477 Calculated P Axis 59 Calculated R Axis 20 Calculated T Axis 31  Diagnosis Normal sinus rhythm Normal ECG When compared with ECG of 21-Jan-2024 05:13, No significant change was found Vidwan, Parampreet ([id number]) on 01/23/2024 7:39:27 AM certifies that he/she has reviewed the ECG tracing and confirms the  independent interpretation is correct.    Anticonvulsant Drug Levels: No results found for this or any previous visit (from the past 72 weeks).  Mental Status Evaluation   Constitutional:   General Appearance Well dressed, well-groomed, normal appearance., Ill appearing, Overweight, and Appears older than stated age   General Behavior Withdrawn  Musculoskeletal:   Gait and Station Unsteady and Assisted: wheelchair  Strength and tone Normal  Psychiatric:   Psychomotor Activity No motor abnormalities  Speech  Less latency and fluent  Mood  Neutral and Depressed  Affect  Flat  Thought Process linear  Associations Poverty of speech  Thought Content/Perceptual Disturbances Patient denies suicidal/homicidal ideation; No evidence of auditory/visual hallucinations or delusions;  Cognition/Sensorium  AAOx4; Memory, attention, language, and fund of knowledge intact   Insight  Improving   Judgment Improving      Electronically signed by: Roylene CINDERELLA Fairly, MD 01/23/2024 7:46 AM          [1] Social History Socioeconomic History  . Marital status: Single  Tobacco Use  . Smoking status: Former    Types: Cigarettes    Passive exposure: Never  Vaping Use  . Vaping status: Never Used  Substance and Sexual Activity  . Alcohol use: Never  . Drug use: Never

## 2024-01-24 NOTE — Progress Notes (Signed)
 [facility] -  Inpatient Progress Note   Date of Service: 01/17/2024 Length of stay: Hospital Day: 5 Date of birth: 1971-04-28 Assessment   Hospital Diagnoses: Principal Problem:   Psychosis, unspecified psychosis type (*)   53 year old female who presented to [facility] on 01/12/2024 at 1857 via medic for a behavioral health evaluation. Per check in notes, Arrives via medic from [facility] requesting a psych evaluation - reports the patient is a new resident at [facility] and has had bizarre behavior - walking in and out of other resident rooms, auditory hallucinations, and paranoia. Reports hx of schizophrenia. Denies SI/HI. Was discharged from [facility] earlier today with dx of UTI.    Client was seen by the ED Provider, [facility] and now by this provider. Client recently at [facility] in Observation from 01/07/2024 to 01/12/2024. There was a concern for Delirium. While at the hospital, she was taking Zyprexa. Her psychiatric medications included Effexor , Trazodone and Prazosin . Prior to seeing [facility], she was observed talking to herself. There is notation that hallucinations began in mid August. Again, she was admitted to [facility] from 9/4 to 9/9. She discharged back to [facility] but behaviors were concerning and so she was brought to the hospital for an evaluation.    The patient has been evaluated and determined to be medically stable by the ED provider.  Patient has been assessed by the ED [facility] Therapist and the findings have been discussed with this provider.  Psychiatry was consulted to assist with psychiatric assessment and treatment/disposition planning.  The chart has been reviewed and pertinent findings are noted below. Based on this review and assessment, the treatment plan has been created and discussed with the treatment team.    Safety Assessment:  Individualized risk factors include: impulsiveness.   Individualized protective factors include: patient has treatable psychiatric disorders and symptoms.  Taking the aforementioned non-modifiable and modifiable risk factors in conjunction with her protective factors, the patient is currently felt to be of low imminent risk of harm to self.    Treatment Plan    - Disposition:             -voluntary - Precautions:             - suicide and fall - Goals and Interventions:             - Group and milieu therapy with family/outpatient support meeting as tolerated. Treatment plan focuses on modifiable risk factors and includes: level of care coordination and recommendations. - Medications: QTC normalized on 9/19 low dose risperdal at bedtime  Trazodone 50 mg Reduce effexor  XR 37.5 mg daily with plan to taper off Start Lexapro 10 mg daily Prazosin  1 mg - Pertinent Labs: k is 4  EKG nl qtc, recheck in am             -  K+ 3.1, Creatinine 0.76, AST 23 and ALT 20             - wbc 15.2, rbc 4.04,Hgb 12.7 and Hct 38.3             - UA with Negative Nitrites, Leukocytes, Protein and Glucose              - UDS Negative              - CT Angio Chest: Small amount of high density material within segmental right lower lobe pulmonary artery  branches, either calcified thrombus or other high density embolized material. No right heart strain.              - Head CT: No acute intracranial abnormality.  HGBA1C(01/08/2024) 6 Lipid panel(01/14/2024)   - Consults:             - consulted NICS appreciate work up - Estimated date of discharge:             -  TBD             -  I certify that the patient does need, on a daily basis, active treatment furnished directly by or requiring the supervision of inpatient psychiatric facility personnel.  For inpatient care, physician will direct treatment team plan within three days of admission and at least weekly thereafter.   Interval History   Patient was seen today for re-evaluation.    Nursing reports Pt reports no  issue issues with sleep. Reported arm pain 6/10 and received tylenol . Pt appears flat. Pt takes meds. Pt utilizes wheel chair and has 1:1. Pt visible on unit. PT q15 safety rounds continued.   The patient reports major issues with performing ADLs.  Patient has been medication compliant.  The patient reports no side effects from medications.  Current symptoms being addressed include:  depression, anxiety, psychosis, and mood stabilization.  Since last assessment, patient reports symptoms have stayed the same.      Met with patient today. She is calm, cooperative and in good behavioral control. No prn for agitation. Eating better and better groomed. Still confused at times. Today, mentioned her daughter was here this am to pick her up. Also continues to change her pin. Mild paranoia.  Alert and oriented except to day of week.      9/16: I called to the patient's daughter to discuss update about medical workup.  Patient's daughter remains very concerned about patient's mental status.  She continues to report patient is sad and withdrawn.  She is concerned about ongoing paranoia as the patient changes her pin frequently.  She feels that the patient is worried about her and family's safety.  She reports Ms. Mchaney has been diagnosed with posttraumatic stress disorder, bipolar disorder depression and anxiety in the past.  She is not aware of any schizophrenia diagnosis.  She states that the patient does have a history of paranoia but not of auditory hallucinations.  She states that the patient was recently hospitalized at [facility] but was discharged after being treated for urinary tract infection.  The patient's daughter feels that there is more going on with her mental status.  The patient's daughter would like to be kept updated on treatment.  Current suicidal/homicidal ideations: Denies Current auditory/visual hallucinations: distracted Review Of Systems: A complete review of systems of the  following systems was conducted (Constitutional, Psychiatric, Neurological, Musculoskeletal, Eyes, Gastrointestinal, Cardiovascular, Respiratory, Skin, and Endocrine). All reviewed systems are negative except pertinent positives identified in the HPI.  Documented Sleep Last 24 Hours (hours): 6.25     Past History   Past Medical History, Past Surgery History, Allergies, Social History, and Family History were reviewed and updated as appropriate.    Social History:  Social History[1]  Evaluation   Vitals:  Vitals:   01/24/24 0643  BP: 138/90  Pulse: 83  Resp:   Temp:   SpO2: 100%    Medications: . amLODIPine  besylate  10 mg Oral Daily  . atorvastatin   40 mg Oral Daily  . buprenorphine   1 patch Transdermal Weekly at 0900  . escitalopram oxalate  10 mg Oral Daily  . labetalol  HCl  200 mg Oral BID  . pantoprazole sodium  40 mg Oral Daily  . potassium chloride   20 mEq Oral ONCE  . prazosin  HCl  1 mg Oral HS  . rivaroxaban   20 mg Oral HS  . traZODone  50 mg Oral HS  . venlafaxine  HCl  37.5 mg Oral Daily   acetaminophen , pain relieving, diazepam , diphenhydrAMINE  **OR** diphenhydrAMINE , hydrOXYzine  pamoate, LORAzepam, ondansetron  **OR** ondansetron   Labs: Recent Results (from the past 72 hours)  Basic Metabolic Panel   Collection Time: 01/22/24  7:45 AM  Result Value Ref Range   Na 142 136 - 146 mmol/L   Potassium 4.0 3.7 - 5.4 mmol/L   Cl 108 97 - 108 mmol/L   CO2 24 20 - 32 mmol/L   AGAP 10 7 - 16 mmol/L   Glucose 115 (H) 65 - 99 mg/dL   BUN 5 (L) 6 - 24 mg/dL   Creatinine 9.42 9.42 - 1.00 mg/dL   Ca 8.8 8.7 - 89.7 mg/dL   BUN/CREAT RATIO 8.8 (L) 11.0 - 26.0   eGFR 107 >=60 mL/min/1.35m2    Imaging Results: No results found.   Results for orders placed or performed during the hospital encounter of 01/13/24  ECG 12 lead   Narrative   Diagnosis Class Abnormal Acquisition Device MV360 Ventricular Rate 69 Atrial Rate 69 P-R Interval 178 QRS Duration 102 Q-T  Interval 454 QTC Calculation(Bazett) 486 Calculated P Axis 24 Calculated R Axis -14 Calculated T Axis -3  Diagnosis Normal sinus rhythm Minimal voltage criteria for LVH, may be normal variant ( R in aVL ) Inferior infarct , age undetermined Abnormal ECG No previous ECGs available Ren Latus ([id number]) on 01/20/2024 10:44:38 AM certifies that he/she has reviewed the ECG tracing and confirms the independent  interpretation is correct.  ECG 12 lead   Narrative   Diagnosis Class Abnormal Acquisition Device MV360 Ventricular Rate 73 Atrial Rate 73 P-R Interval 164 QRS Duration 102 Q-T Interval 458 QTC Calculation(Bazett) 504 Calculated P Axis 25 Calculated R Axis -12 Calculated T Axis 8  Diagnosis Normal sinus rhythm with sinus arrhythmia Minimal voltage criteria for LVH, may be normal variant ( R in aVL ) Prolonged QT Abnormal ECG When compared with ECG of 19-Jan-2024 13:05, (Unconfirmed) No significant change was found Ren, Shana ([id number]) on 01/20/2024 10:45:29 AM certifies that he/she has reviewed the ECG tracing and confirms the independent  interpretation is correct.  ECG 12-Lead   Narrative   Diagnosis Class Abnormal Acquisition Device MV360 Ventricular Rate 88 Atrial Rate 88 P-R Interval 160 QRS Duration 94 Q-T Interval 436 QTC Calculation(Bazett) 527 Calculated P Axis 50 Calculated R Axis -5 Calculated T Axis 13  Diagnosis Normal sinus rhythm Prolonged QT Abnormal ECG When compared with ECG of 19-Jan-2024 15:37, (Unconfirmed) No significant change was found Ren, Shana ([id number]) on 01/20/2024 10:50:25 AM certifies that he/she has reviewed the ECG tracing and confirms the independent  interpretation is correct.  ECG 12-Lead   Narrative   Diagnosis Class Abnormal Acquisition Device MV360 Ventricular Rate 84 Atrial Rate 84 P-R Interval 132 QRS Duration 96 Q-T Interval 418 QTC Calculation(Bazett) 493 Calculated P Axis 49 Calculated R Axis 31 Calculated T  Axis 35  Diagnosis Normal sinus rhythm Prolonged QT Abnormal ECG When compared with ECG of 20-Jan-2024 05:04, No significant change was found Ren Latus ([id number]) on 01/21/2024 2:47:22 PM certifies that he/she has  reviewed the ECG tracing and confirms the independent  interpretation is correct.  ECG 12-Lead   Narrative   Diagnosis Class Normal Acquisition Device MV360 Ventricular Rate 76 Atrial Rate 76 P-R Interval 176 QRS Duration 92 Q-T Interval 424 QTC Calculation(Bazett) 477 Calculated P Axis 59 Calculated R Axis 20 Calculated T Axis 31  Diagnosis Normal sinus rhythm Normal ECG When compared with ECG of 21-Jan-2024 05:13, No significant change was found Vidwan, Parampreet ([id number]) on 01/23/2024 7:39:27 AM certifies that he/she has reviewed the ECG tracing and confirms the  independent interpretation is correct.    Anticonvulsant Drug Levels: No results found for this or any previous visit (from the past 72 weeks).  Mental Status Evaluation   Constitutional:   General Appearance Well dressed, well-groomed, normal appearance., Ill appearing, Overweight, and Appears older than stated age  General Behavior Withdrawn  Musculoskeletal:   Gait and Station Unsteady and Assisted: wheelchair  Strength and tone Normal  Psychiatric:   Psychomotor Activity No motor abnormalities  Speech  Less latency and fluent  Mood  Neutral and Depressed  Affect  Flat  Thought Process linear  Associations Poverty of speech  Thought Content/Perceptual Disturbances Patient denies suicidal/homicidal ideation; No evidence of auditory/visual hallucinations or delusions;  Cognition/Sensorium  AAOx4; Memory, attention, language, and fund of knowledge intact   Insight  Improving   Judgment Improving      Electronically signed by: Roylene CINDERELLA Fairly, MD 01/24/2024 9:13 AM          [1] Social History Socioeconomic History  . Marital status: Single  Tobacco Use  . Smoking status: Former     Types: Cigarettes    Passive exposure: Never  Vaping Use  . Vaping status: Never Used  Substance and Sexual Activity  . Alcohol use: Never  . Drug use: Never
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
## 2024-01-27 DIAGNOSIS — M62838 Other muscle spasm: Secondary | ICD-10-CM | POA: Diagnosis not present

## 2024-01-27 NOTE — Progress Notes (Signed)
 [facility] Psychiatry -  Inpatient Progress Note   Date of Service: 01/17/2024 Length of stay: Hospital Day: 5 Date of birth: Jan 16, 1971 Assessment   Hospital Diagnoses: Principal Problem:   Psychosis, unspecified psychosis type (*) Active Problems:   Delirium   History of bipolar disorder   53 year old female who presented to [facility] on 01/12/2024 at 1857 via medic for a behavioral health evaluation. Per check in notes, Arrives via medic from [facility] requesting a psych evaluation - reports the patient is a new resident at [facility] and has had bizarre behavior - walking in and out of other resident rooms, auditory hallucinations, and paranoia. Reports hx of schizophrenia. Denies SI/HI. Was discharged from [facility] earlier today with dx of UTI.    Client was seen by the ED Provider, [facility] Clinician and now by this provider. Client recently at [facility] in Observation from 01/07/2024 to 01/12/2024. There was a concern for Delirium. While at the hospital, she was taking Zyprexa. Her psychiatric medications included Effexor , Trazodone and Prazosin . Prior to seeing Naima, she was observed talking to herself. There is notation that hallucinations began in mid August. Again, she was admitted to [facility] from 9/4 to 9/9. She discharged back to [facility] but behaviors were concerning and so she was brought to the hospital for an evaluation.    The patient has been evaluated and determined to be medically stable by the ED provider.  Patient has been assessed by the ED [facility] Therapist and the findings have been discussed with this provider.  Psychiatry was consulted to assist with psychiatric assessment and treatment/disposition planning.  The chart has been reviewed and pertinent findings are noted below. Based on this review and assessment, the treatment plan has been created and discussed with the treatment team.    Safety  Assessment:  Individualized risk factors include: impulsiveness.  Individualized protective factors include: patient has treatable psychiatric disorders and symptoms.  Taking the aforementioned non-modifiable and modifiable risk factors in conjunction with her protective factors, the patient is currently felt to be of low imminent risk of harm to self.    Treatment Plan    - Disposition:             -voluntary - Precautions:             - suicide and fall - Goals and Interventions:             - Group and milieu therapy with family/outpatient support meeting as tolerated. Treatment plan focuses on modifiable risk factors and includes: level of care coordination and recommendations.  - Medications: Zyprexa and Effexor  discontinued.  Risperidone 1 mg nightly, Lexapro 10 mg daily, prazosin  1 mg nightly, trazodone 50 mg nightly Trazodone 50 mg . amLODIPine  besylate  10 mg Oral Daily  . atorvastatin   40 mg Oral Daily  . buprenorphine   1 patch Transdermal Weekly at 0900  . escitalopram oxalate  10 mg Oral Daily  . labetalol  HCl  200 mg Oral BID  . pantoprazole sodium  40 mg Oral Daily  . potassium chloride   10 mEq Oral Daily  . prazosin  HCl  1 mg Oral HS  . risperiDONE  1 mg Oral HS  . rivaroxaban   20 mg Oral HS  . traZODone  50 mg Oral HS   - Pertinent Labs:              -  K+ 3.1, Creatinine 0.76, AST  23 and ALT 20             - wbc 15.2, rbc 4.04,Hgb 12.7 and Hct 38.3             - UA with Negative Nitrites, Leukocytes, Protein and Glucose              - UDS Negative              - CT Angio Chest: Small amount of high density material within segmental right lower lobe pulmonary artery branches, either calcified thrombus or other high density embolized material. No right heart strain.              - Head CT: No acute intracranial abnormality.  HGBA1C(01/08/2024) 6 Lipid panel(01/14/2024)   - Consults:             - consulted NICS appreciate work up - Estimated date of discharge:              -  16-17 Days LOS             -  I certify that the patient does need, on a daily basis, active treatment furnished directly by or requiring the supervision of inpatient psychiatric facility personnel.  For inpatient care, physician will direct treatment team plan within three days of admission and at least weekly thereafter.   Interval History   Patient was seen today for re-evaluation.   Nursing reports:  Patient is alert and oriented x 3, she is in no acute distress. Compliant with morning medications and without any questions or concerns. She denies suicidal/homicidal ideation. She denies visual/auditory hallucinations. She has been visible on the unit, wheeling herself around the unit in the wheelchair with 1:1 present. Will continue nursing rounding.   The patient reports major issues with performing ADLs.  Patient has been medication compliant.  The patient reports no side effects from medications.  Current symptoms being addressed include:  depression, anxiety, psychosis, and mood stabilization.  Since last assessment, patient reports symptoms have stayed the same.     EMR reviewed and patient assessed by Clinical research associate. Dossie was previously treated by Dr. Maree, with undersigned psychiatrist assuming care 01/26/24.   Deitra was recently admitted to [facility] following a urinary tract infection with subsequent mental status changes.  Writer also reassured her that she was progressing and had fewer episodes of confusion, likely associated with with delirium from urinary tract infection.   Bobbi is compliant with medications with denial of adverse effects.   Per Dr. Maree:  9/16: I called to the patient's daughter to discuss update about medical workup.  Patient's daughter remains very concerned about patient's mental status.  She continues to report patient is sad and withdrawn.  She is concerned about ongoing paranoia as the patient changes her pin frequently.  She feels that the patient is worried about her  and family's safety.  She reports Ms. Sparr has been diagnosed with posttraumatic stress disorder, bipolar disorder depression and anxiety in the past.  She is not aware of any schizophrenia diagnosis.  She states that the patient does have a history of paranoia but not of auditory hallucinations.   Current suicidal/homicidal ideations: Denies Current auditory/visual hallucinations: Denies AVH; more clear Review Of Systems: A complete review of systems of the following systems was conducted (Constitutional, Psychiatric, Neurological, Musculoskeletal, Eyes, Gastrointestinal, Cardiovascular, Respiratory, Skin, and Endocrine). All reviewed systems are negative except pertinent positives identified in the HPI.  Documented Sleep Last 24  Hours (hours): 6.25     Past History   Past Medical History, Past Surgery History, Allergies, Social History, and Family History were reviewed and updated as appropriate.    Social History:  Social History[1]  Evaluation   Vitals:  Vitals:   01/27/24 0828  BP: 135/89  Pulse: 91  Resp:   Temp:   SpO2:     Medications: . amLODIPine  besylate  10 mg Oral Daily  . atorvastatin   40 mg Oral Daily  . buprenorphine   1 patch Transdermal Weekly at 0900  . escitalopram oxalate  10 mg Oral Daily  . labetalol  HCl  200 mg Oral BID  . pantoprazole sodium  40 mg Oral Daily  . potassium chloride   10 mEq Oral Daily  . prazosin  HCl  1 mg Oral HS  . risperiDONE  1 mg Oral HS  . rivaroxaban   20 mg Oral HS  . traZODone  50 mg Oral HS   acetaminophen , pain relieving, diazepam , diphenhydrAMINE  **OR** diphenhydrAMINE , hydrOXYzine  pamoate, LORAzepam, ondansetron  **OR** ondansetron   Labs: Recent Results (from the past 72 hours)  Basic Metabolic Panel   Collection Time: 01/25/24  9:15 AM  Result Value Ref Range   Na 144 136 - 146 mmol/L   Potassium 3.6 (L) 3.7 - 5.4 mmol/L   Cl 108 97 - 108 mmol/L   CO2 25 20 - 32 mmol/L   AGAP 11 7 - 16 mmol/L   Glucose 121 (H)  65 - 99 mg/dL   BUN 9 6 - 24 mg/dL   Creatinine 9.47 (L) 9.42 - 1.00 mg/dL   Ca 9.1 8.7 - 89.7 mg/dL   BUN/CREAT RATIO 82.6 11.0 - 26.0   eGFR 110 >=60 mL/min/1.24m2    Imaging Results: No results found.   Results for orders placed or performed during the hospital encounter of 01/13/24  ECG 12 lead   Narrative   Diagnosis Class Abnormal Acquisition Device MV360 Ventricular Rate 69 Atrial Rate 69 P-R Interval 178 QRS Duration 102 Q-T Interval 454 QTC Calculation(Bazett) 486 Calculated P Axis 24 Calculated R Axis -14 Calculated T Axis -3  Diagnosis Normal sinus rhythm Minimal voltage criteria for LVH, may be normal variant ( R in aVL ) Inferior infarct , age undetermined Abnormal ECG No previous ECGs available Ren Latus ([id number]) on 01/20/2024 10:44:38 AM certifies that he/she has reviewed the ECG tracing and confirms the independent  interpretation is correct.  ECG 12 lead   Narrative   Diagnosis Class Abnormal Acquisition Device MV360 Ventricular Rate 73 Atrial Rate 73 P-R Interval 164 QRS Duration 102 Q-T Interval 458 QTC Calculation(Bazett) 504 Calculated P Axis 25 Calculated R Axis -12 Calculated T Axis 8  Diagnosis Normal sinus rhythm with sinus arrhythmia Minimal voltage criteria for LVH, may be normal variant ( R in aVL ) Prolonged QT Abnormal ECG When compared with ECG of 19-Jan-2024 13:05, (Unconfirmed) No significant change was found Ren, Shana ([id number]) on 01/20/2024 10:45:29 AM certifies that he/she has reviewed the ECG tracing and confirms the independent  interpretation is correct.  ECG 12-Lead   Narrative   Diagnosis Class Abnormal Acquisition Device MV360 Ventricular Rate 88 Atrial Rate 88 P-R Interval 160 QRS Duration 94 Q-T Interval 436 QTC Calculation(Bazett) 527 Calculated P Axis 50 Calculated R Axis -5 Calculated T Axis 13  Diagnosis Normal sinus rhythm Prolonged QT Abnormal ECG When compared with ECG of 19-Jan-2024 15:37,  (Unconfirmed) No significant change was found Ren Latus ([id number]) on 01/20/2024 10:50:25 AM certifies that he/she has reviewed the  ECG tracing and confirms the independent  interpretation is correct.  ECG 12-Lead   Narrative   Diagnosis Class Abnormal Acquisition Device MV360 Ventricular Rate 84 Atrial Rate 84 P-R Interval 132 QRS Duration 96 Q-T Interval 418 QTC Calculation(Bazett) 493 Calculated P Axis 49 Calculated R Axis 31 Calculated T Axis 35  Diagnosis Normal sinus rhythm Prolonged QT Abnormal ECG When compared with ECG of 20-Jan-2024 05:04, No significant change was found Ren Latus ([id number]) on 01/21/2024 2:47:22 PM certifies that he/she has reviewed the ECG tracing and confirms the independent  interpretation is correct.  ECG 12-Lead   Narrative   Diagnosis Class Normal Acquisition Device MV360 Ventricular Rate 76 Atrial Rate 76 P-R Interval 176 QRS Duration 92 Q-T Interval 424 QTC Calculation(Bazett) 477 Calculated P Axis 59 Calculated R Axis 20 Calculated T Axis 31  Diagnosis Normal sinus rhythm Normal ECG When compared with ECG of 21-Jan-2024 05:13, No significant change was found Vidwan, Parampreet ([id number]) on 01/23/2024 7:39:27 AM certifies that he/she has reviewed the ECG tracing and confirms the  independent interpretation is correct.  ECG 12-Lead   Narrative   Diagnosis Class Abnormal Acquisition Device MV360 Ventricular Rate 87 Atrial Rate 87 P-R Interval 168 QRS Duration 94 Q-T Interval 400 QTC Calculation(Bazett) 481 Calculated P Axis 57 Calculated R Axis 20 Calculated T Axis 18  Diagnosis Normal sinus rhythm Prolonged QT Abnormal ECG When compared with ECG of 22-Jan-2024 05:54, No significant change was found Vidwan, Parampreet ([id number]) on 01/25/2024 6:55:57 AM certifies that he/she has reviewed the ECG tracing and confirms the  independent interpretation is correct.  ECG 12 lead   Narrative   Diagnosis Class Normal Acquisition Device  MV360 Ventricular Rate 88 Atrial Rate 88 P-R Interval 166 QRS Duration 90 Q-T Interval 384 QTC Calculation(Bazett) 464 Calculated P Axis 65 Calculated R Axis 39 Calculated T Axis 44  Diagnosis Normal sinus rhythm Normal ECG When compared with ECG of 25-Jan-2024 04:28, No significant change was found Pu, Daniel ([id number]) on 01/26/2024 3:02:09 PM certifies that he/she has reviewed the ECG tracing and confirms the independent  interpretation is correct.    Anticonvulsant Drug Levels: No results found for this or any previous visit (from the past 72 weeks).  Mental Status Evaluation   Constitutional:   General Appearance Well dressed, well-groomed, normal appearance., Ill appearing, Overweight, and Appears older than stated age  General Behavior Withdrawn  Musculoskeletal:   Gait and Station Unsteady and Assisted: wheelchair  Strength and tone Normal  Psychiatric:   Psychomotor Activity No motor abnormalities  Speech  Less latency and fluent  Mood  Neutral and Depressed  Affect  Flat  Thought Process linear  Associations Poverty of speech  Thought Content/Perceptual Disturbances Evidence of:  Paranoid ideations  Cognition/Sensorium  AAOx4; Memory, attention, language, and fund of knowledge intact   Insight  Poor to fair  Judgment Poor     Electronically signed by: Rankin CHRISTELLA Pepper, MD 01/27/2024 10:06 AM          [1] Social History Socioeconomic History  . Marital status: Single  Tobacco Use  . Smoking status: Former    Types: Cigarettes    Passive exposure: Never  Vaping Use  . Vaping status: Never Used  Substance and Sexual Activity  . Alcohol use: Never  . Drug use: Never

## 2024-01-28 ENCOUNTER — Ambulatory Visit: Admitting: Psychiatry

## 2024-02-01 ENCOUNTER — Encounter: Attending: Physical Medicine & Rehabilitation | Admitting: Physical Medicine & Rehabilitation

## 2024-02-01 DIAGNOSIS — Z23 Encounter for immunization: Secondary | ICD-10-CM | POA: Diagnosis not present

## 2024-02-01 DIAGNOSIS — I2699 Other pulmonary embolism without acute cor pulmonale: Secondary | ICD-10-CM | POA: Diagnosis not present

## 2024-02-01 DIAGNOSIS — Z853 Personal history of malignant neoplasm of breast: Secondary | ICD-10-CM | POA: Diagnosis not present

## 2024-02-01 DIAGNOSIS — G894 Chronic pain syndrome: Secondary | ICD-10-CM | POA: Insufficient documentation

## 2024-02-01 DIAGNOSIS — M170 Bilateral primary osteoarthritis of knee: Secondary | ICD-10-CM | POA: Insufficient documentation

## 2024-02-01 DIAGNOSIS — E1165 Type 2 diabetes mellitus with hyperglycemia: Secondary | ICD-10-CM | POA: Diagnosis not present

## 2024-02-01 DIAGNOSIS — Z1211 Encounter for screening for malignant neoplasm of colon: Secondary | ICD-10-CM | POA: Diagnosis not present

## 2024-02-01 DIAGNOSIS — C50419 Malignant neoplasm of upper-outer quadrant of unspecified female breast: Secondary | ICD-10-CM | POA: Diagnosis not present

## 2024-02-01 DIAGNOSIS — Z1231 Encounter for screening mammogram for malignant neoplasm of breast: Secondary | ICD-10-CM | POA: Diagnosis not present

## 2024-02-01 DIAGNOSIS — I10 Essential (primary) hypertension: Secondary | ICD-10-CM | POA: Diagnosis not present

## 2024-02-01 DIAGNOSIS — Z5181 Encounter for therapeutic drug level monitoring: Secondary | ICD-10-CM | POA: Insufficient documentation

## 2024-02-02 DIAGNOSIS — M170 Bilateral primary osteoarthritis of knee: Secondary | ICD-10-CM | POA: Diagnosis not present

## 2024-02-02 DIAGNOSIS — R52 Pain, unspecified: Secondary | ICD-10-CM | POA: Diagnosis not present

## 2024-02-26 ENCOUNTER — Other Ambulatory Visit: Payer: Self-pay | Admitting: Nurse Practitioner

## 2024-03-09 ENCOUNTER — Other Ambulatory Visit: Payer: Self-pay | Admitting: Nurse Practitioner

## 2024-03-09 DIAGNOSIS — I10 Essential (primary) hypertension: Secondary | ICD-10-CM

## 2024-03-10 ENCOUNTER — Telehealth: Payer: Self-pay

## 2024-03-10 NOTE — Telephone Encounter (Signed)
 I called patient but was unable to leave a message (call cannot be completed at this time).  I sent a message to patient via MyChart.  E2C2 - when patient calls back, please transfer call to our office and ask to speak with Luke or Darice.

## 2024-03-11 NOTE — Telephone Encounter (Addendum)
 Note.  I added patient back to my schedule for 03/14/24. I am able to do her AWV since she was seen for an acute visit with PCP 10/13/23. Message needs to go to PCP to find out when patient needs a follow-up with her PCP. Please forward to PCP to determine when she needs to be seen at the office again.

## 2024-03-14 ENCOUNTER — Ambulatory Visit

## 2024-05-21 ENCOUNTER — Other Ambulatory Visit: Payer: Self-pay | Admitting: Nurse Practitioner

## 2024-05-21 DIAGNOSIS — M25561 Pain in right knee: Secondary | ICD-10-CM
